# Patient Record
Sex: Male | Born: 1940 | Race: White | Hispanic: No | Marital: Married | Smoking: Never smoker
Health system: Southern US, Community
[De-identification: ages and names within clinical notes are randomized; demographics above are authoritative.]

## PROBLEM LIST (undated history)

## (undated) DIAGNOSIS — F0150 Vascular dementia without behavioral disturbance: Secondary | ICD-10-CM

## (undated) DIAGNOSIS — I10 Essential (primary) hypertension: Secondary | ICD-10-CM

## (undated) DIAGNOSIS — I639 Cerebral infarction, unspecified: Secondary | ICD-10-CM

## (undated) DIAGNOSIS — T82898A Other specified complication of vascular prosthetic devices, implants and grafts, initial encounter: Secondary | ICD-10-CM

## (undated) DIAGNOSIS — E7800 Pure hypercholesterolemia, unspecified: Secondary | ICD-10-CM

## (undated) HISTORY — DX: Vascular dementia, unspecified severity, without behavioral disturbance, psychotic disturbance, mood disturbance, and anxiety: F01.50

## (undated) HISTORY — DX: Cerebral infarction, unspecified: I63.9

## (undated) HISTORY — DX: Pure hypercholesterolemia, unspecified: E78.00

## (undated) HISTORY — DX: Other specified complication of vascular prosthetic devices, implants and grafts, initial encounter: T82.898A

## (undated) HISTORY — DX: Essential (primary) hypertension: I10

## (undated) HISTORY — PX: GALLBLADDER SURGERY: SHX652

## (undated) HISTORY — PX: CATARACT EXTRACTION: SUR2

---

## 2006-02-24 ENCOUNTER — Ambulatory Visit: Payer: Self-pay | Admitting: Ophthalmology

## 2006-03-13 ENCOUNTER — Ambulatory Visit: Payer: Self-pay | Admitting: Unknown Physician Specialty

## 2006-03-31 ENCOUNTER — Ambulatory Visit: Payer: Self-pay | Admitting: Ophthalmology

## 2009-05-30 ENCOUNTER — Ambulatory Visit: Payer: Self-pay | Admitting: Radiation Oncology

## 2009-06-20 ENCOUNTER — Ambulatory Visit: Payer: Self-pay | Admitting: Radiation Oncology

## 2009-06-30 ENCOUNTER — Ambulatory Visit: Payer: Self-pay | Admitting: Radiation Oncology

## 2009-07-31 ENCOUNTER — Ambulatory Visit: Payer: Self-pay | Admitting: Radiation Oncology

## 2009-08-21 ENCOUNTER — Ambulatory Visit: Payer: Self-pay | Admitting: Radiation Oncology

## 2009-08-24 ENCOUNTER — Ambulatory Visit: Payer: Self-pay | Admitting: Radiation Oncology

## 2009-08-24 ENCOUNTER — Inpatient Hospital Stay (HOSPITAL_COMMUNITY): Admission: EM | Admit: 2009-08-24 | Discharge: 2009-08-28 | Payer: Self-pay | Admitting: Emergency Medicine

## 2009-08-25 ENCOUNTER — Encounter (INDEPENDENT_AMBULATORY_CARE_PROVIDER_SITE_OTHER): Payer: Self-pay | Admitting: Neurology

## 2009-08-27 ENCOUNTER — Encounter (INDEPENDENT_AMBULATORY_CARE_PROVIDER_SITE_OTHER): Payer: Self-pay | Admitting: Neurology

## 2009-08-28 ENCOUNTER — Ambulatory Visit: Payer: Self-pay | Admitting: Radiation Oncology

## 2009-09-03 ENCOUNTER — Encounter: Payer: Self-pay | Admitting: Neurology

## 2009-09-28 ENCOUNTER — Encounter: Payer: Self-pay | Admitting: Neurology

## 2009-10-28 ENCOUNTER — Ambulatory Visit: Payer: Self-pay | Admitting: Radiation Oncology

## 2009-11-08 ENCOUNTER — Ambulatory Visit: Payer: Self-pay | Admitting: Radiation Oncology

## 2009-11-28 ENCOUNTER — Ambulatory Visit: Payer: Self-pay | Admitting: Radiation Oncology

## 2010-01-28 ENCOUNTER — Ambulatory Visit: Payer: Self-pay | Admitting: Radiation Oncology

## 2010-02-22 ENCOUNTER — Ambulatory Visit: Payer: Self-pay | Admitting: Radiation Oncology

## 2010-02-28 ENCOUNTER — Ambulatory Visit: Payer: Self-pay | Admitting: Radiation Oncology

## 2010-03-11 ENCOUNTER — Encounter: Admission: RE | Admit: 2010-03-11 | Discharge: 2010-03-11 | Payer: Self-pay | Admitting: Neurology

## 2010-03-20 ENCOUNTER — Encounter: Payer: Self-pay | Admitting: Neurology

## 2010-04-01 ENCOUNTER — Ambulatory Visit (HOSPITAL_COMMUNITY)
Admission: RE | Admit: 2010-04-01 | Discharge: 2010-04-01 | Payer: Self-pay | Source: Home / Self Care | Admitting: Interventional Radiology

## 2010-06-05 ENCOUNTER — Ambulatory Visit: Payer: Self-pay | Admitting: Radiation Oncology

## 2010-06-20 ENCOUNTER — Ambulatory Visit: Payer: Self-pay | Admitting: Radiation Oncology

## 2010-06-30 ENCOUNTER — Ambulatory Visit: Payer: Self-pay | Admitting: Radiation Oncology

## 2010-07-02 ENCOUNTER — Ambulatory Visit: Payer: Self-pay | Admitting: Urology

## 2010-07-03 ENCOUNTER — Ambulatory Visit: Payer: Self-pay | Admitting: Radiation Oncology

## 2010-07-31 ENCOUNTER — Ambulatory Visit: Payer: Self-pay | Admitting: Radiation Oncology

## 2010-08-02 ENCOUNTER — Ambulatory Visit: Payer: Self-pay | Admitting: Radiation Oncology

## 2010-08-29 ENCOUNTER — Ambulatory Visit: Payer: Self-pay | Admitting: Radiation Oncology

## 2010-09-12 LAB — DIFFERENTIAL
Basophils Absolute: 0.1 10*3/uL (ref 0.0–0.1)
Basophils Relative: 1 % (ref 0–1)
Eosinophils Absolute: 0.2 10*3/uL (ref 0.0–0.7)
Eosinophils Relative: 5 % (ref 0–5)
Lymphocytes Relative: 22 % (ref 12–46)
Lymphs Abs: 0.9 10*3/uL (ref 0.7–4.0)
Monocytes Absolute: 0.7 10*3/uL (ref 0.1–1.0)
Monocytes Relative: 18 % — ABNORMAL HIGH (ref 3–12)
Neutro Abs: 2 10*3/uL (ref 1.7–7.7)
Neutrophils Relative %: 53 % (ref 43–77)

## 2010-09-12 LAB — CBC
HCT: 40.3 % (ref 39.0–52.0)
Hemoglobin: 13.5 g/dL (ref 13.0–17.0)
MCH: 31.1 pg (ref 26.0–34.0)
MCHC: 33.5 g/dL (ref 30.0–36.0)
MCV: 92.9 fL (ref 78.0–100.0)
Platelets: 227 10*3/uL (ref 150–400)
RBC: 4.34 MIL/uL (ref 4.22–5.81)
RDW: 13.7 % (ref 11.5–15.5)
WBC: 3.8 10*3/uL — ABNORMAL LOW (ref 4.0–10.5)

## 2010-09-12 LAB — BASIC METABOLIC PANEL
BUN: 20 mg/dL (ref 6–23)
CO2: 30 mEq/L (ref 19–32)
Calcium: 9.3 mg/dL (ref 8.4–10.5)
Chloride: 103 mEq/L (ref 96–112)
Creatinine, Ser: 1.56 mg/dL — ABNORMAL HIGH (ref 0.4–1.5)
GFR calc Af Amer: 54 mL/min — ABNORMAL LOW (ref >60)
GFR calc non Af Amer: 45 mL/min — ABNORMAL LOW (ref >60)
Glucose, Bld: 100 mg/dL — ABNORMAL HIGH (ref 70–99)
Potassium: 4.4 mEq/L (ref 3.5–5.1)
Sodium: 138 mEq/L (ref 135–145)

## 2010-09-12 LAB — SURGICAL PCR SCREEN
MRSA, PCR: NEGATIVE
Staphylococcus aureus: NEGATIVE

## 2010-09-12 LAB — PROTIME-INR
INR: 0.92 (ref 0.00–1.49)
Prothrombin Time: 12.6 seconds (ref 11.6–15.2)

## 2010-09-12 LAB — APTT: aPTT: 25 seconds (ref 24–37)

## 2010-09-20 LAB — BLOOD GAS, ARTERIAL
O2 Saturation: 97.7 %
PEEP: 5 cmH2O
Patient temperature: 95.3
RATE: 16 resp/min
pH, Arterial: 7.388 (ref 7.350–7.450)

## 2010-09-20 LAB — POCT I-STAT, CHEM 8
Creatinine, Ser: 1.4 mg/dL (ref 0.4–1.5)
Glucose, Bld: 102 mg/dL — ABNORMAL HIGH (ref 70–99)
HCT: 47 % (ref 39.0–52.0)
Hemoglobin: 16 g/dL (ref 13.0–17.0)
Potassium: 4.3 mEq/L (ref 3.5–5.1)
Sodium: 140 mEq/L (ref 135–145)
TCO2: 26 mmol/L (ref 0–100)

## 2010-09-20 LAB — URINALYSIS, MICROSCOPIC ONLY
Bilirubin Urine: NEGATIVE
Ketones, ur: NEGATIVE mg/dL
Protein, ur: NEGATIVE mg/dL
Urobilinogen, UA: 0.2 mg/dL (ref 0.0–1.0)

## 2010-09-20 LAB — BASIC METABOLIC PANEL
CO2: 23 mEq/L (ref 19–32)
Calcium: 7.7 mg/dL — ABNORMAL LOW (ref 8.4–10.5)
Chloride: 110 mEq/L (ref 96–112)
GFR calc Af Amer: >60 mL/min (ref >60)
Potassium: 3.7 mEq/L (ref 3.5–5.1)
Sodium: 138 mEq/L (ref 135–145)

## 2010-09-20 LAB — APTT: aPTT: 29 seconds (ref 24–37)

## 2010-09-20 LAB — GLUCOSE, CAPILLARY
Glucose-Capillary: 100 mg/dL — ABNORMAL HIGH (ref 70–99)
Glucose-Capillary: 111 mg/dL — ABNORMAL HIGH (ref 70–99)
Glucose-Capillary: 114 mg/dL — ABNORMAL HIGH (ref 70–99)
Glucose-Capillary: 116 mg/dL — ABNORMAL HIGH (ref 70–99)

## 2010-09-20 LAB — LIPID PANEL
LDL Cholesterol: 77 mg/dL (ref 0–99)
Triglycerides: 293 mg/dL — ABNORMAL HIGH (ref <150)
VLDL: 59 mg/dL — ABNORMAL HIGH (ref 0–40)

## 2010-09-20 LAB — PROTIME-INR
INR: 0.99 (ref 0.00–1.49)
Prothrombin Time: 13 seconds (ref 11.6–15.2)

## 2010-09-20 LAB — DIFFERENTIAL
Eosinophils Absolute: 0.3 10*3/uL (ref 0.0–0.7)
Lymphs Abs: 1.7 10*3/uL (ref 0.7–4.0)
Monocytes Relative: 13 % — ABNORMAL HIGH (ref 3–12)
Neutro Abs: 2.9 10*3/uL (ref 1.7–7.7)
Neutrophils Relative %: 51 % (ref 43–77)

## 2010-09-20 LAB — COMPREHENSIVE METABOLIC PANEL
ALT: 17 U/L (ref 0–53)
BUN: 17 mg/dL (ref 6–23)
CO2: 26 mEq/L (ref 19–32)
Calcium: 9.3 mg/dL (ref 8.4–10.5)
Creatinine, Ser: 1.28 mg/dL (ref 0.4–1.5)
GFR calc non Af Amer: 56 mL/min — ABNORMAL LOW (ref >60)
Glucose, Bld: 111 mg/dL — ABNORMAL HIGH (ref 70–99)
Sodium: 140 mEq/L (ref 135–145)
Total Protein: 7 g/dL (ref 6.0–8.3)

## 2010-09-20 LAB — MRSA PCR SCREENING: MRSA by PCR: NEGATIVE

## 2010-09-20 LAB — CBC
HCT: 36.9 % — ABNORMAL LOW (ref 39.0–52.0)
Hemoglobin: 13 g/dL (ref 13.0–17.0)
MCHC: 35 g/dL (ref 30.0–36.0)
MCHC: 35.2 g/dL (ref 30.0–36.0)
MCV: 97 fL (ref 78.0–100.0)
Platelets: 214 10*3/uL (ref 150–400)
RBC: 3.79 MIL/uL — ABNORMAL LOW (ref 4.22–5.81)
RBC: 4.69 MIL/uL (ref 4.22–5.81)
RDW: 13 % (ref 11.5–15.5)

## 2010-09-20 LAB — HEMOGLOBIN A1C: Mean Plasma Glucose: 123 mg/dL

## 2010-09-20 LAB — CK TOTAL AND CKMB (NOT AT ARMC)
CK, MB: 3.6 ng/mL (ref 0.3–4.0)
Total CK: 180 U/L (ref 7–232)

## 2010-12-04 ENCOUNTER — Ambulatory Visit: Payer: Self-pay | Admitting: Radiation Oncology

## 2010-12-29 ENCOUNTER — Ambulatory Visit: Payer: Self-pay | Admitting: Radiation Oncology

## 2011-05-06 ENCOUNTER — Encounter: Payer: Self-pay | Admitting: Internal Medicine

## 2011-05-31 ENCOUNTER — Encounter: Payer: Self-pay | Admitting: Internal Medicine

## 2012-08-11 DIAGNOSIS — R339 Retention of urine, unspecified: Secondary | ICD-10-CM | POA: Insufficient documentation

## 2012-08-11 DIAGNOSIS — C61 Malignant neoplasm of prostate: Secondary | ICD-10-CM | POA: Insufficient documentation

## 2012-08-11 DIAGNOSIS — N183 Chronic kidney disease, stage 3 unspecified: Secondary | ICD-10-CM | POA: Insufficient documentation

## 2012-09-08 ENCOUNTER — Other Ambulatory Visit: Payer: Self-pay | Admitting: Neurology

## 2012-09-08 DIAGNOSIS — I63239 Cerebral infarction due to unspecified occlusion or stenosis of unspecified carotid arteries: Secondary | ICD-10-CM

## 2012-10-26 ENCOUNTER — Ambulatory Visit: Payer: Self-pay | Admitting: Unknown Physician Specialty

## 2012-11-16 ENCOUNTER — Ambulatory Visit (INDEPENDENT_AMBULATORY_CARE_PROVIDER_SITE_OTHER): Payer: Medicare Other

## 2012-11-16 ENCOUNTER — Ambulatory Visit (INDEPENDENT_AMBULATORY_CARE_PROVIDER_SITE_OTHER): Payer: Medicare Other | Admitting: *Deleted

## 2012-11-16 DIAGNOSIS — I639 Cerebral infarction, unspecified: Secondary | ICD-10-CM

## 2012-11-16 DIAGNOSIS — Z0289 Encounter for other administrative examinations: Secondary | ICD-10-CM

## 2012-11-16 DIAGNOSIS — I63239 Cerebral infarction due to unspecified occlusion or stenosis of unspecified carotid arteries: Secondary | ICD-10-CM

## 2012-11-16 NOTE — Progress Notes (Signed)
Participant in the office today for his 3rd. Annual visit for the IRIS Study. MMSE was completed without any problems.  Blood was drawn and shipped. Participant to continue with follow-up per protocol.

## 2013-01-04 ENCOUNTER — Telehealth: Payer: Self-pay | Admitting: *Deleted

## 2013-01-04 NOTE — Telephone Encounter (Signed)
I called and gave results to pt. (carotid no change, tcd with collateral flow from L to R).  He verbalized understanding.

## 2013-02-03 DIAGNOSIS — N320 Bladder-neck obstruction: Secondary | ICD-10-CM | POA: Insufficient documentation

## 2013-07-27 ENCOUNTER — Encounter: Payer: Self-pay | Admitting: Neurology

## 2013-07-27 ENCOUNTER — Ambulatory Visit (INDEPENDENT_AMBULATORY_CARE_PROVIDER_SITE_OTHER): Payer: Medicare Other | Admitting: Neurology

## 2013-07-27 ENCOUNTER — Encounter (INDEPENDENT_AMBULATORY_CARE_PROVIDER_SITE_OTHER): Payer: Self-pay

## 2013-07-27 VITALS — BP 100/59 | HR 67 | Ht 69.0 in | Wt 195.0 lb

## 2013-07-27 DIAGNOSIS — I63239 Cerebral infarction due to unspecified occlusion or stenosis of unspecified carotid arteries: Secondary | ICD-10-CM

## 2013-07-27 NOTE — Patient Instructions (Signed)
Continue Plavix for secondary stroke prevention for strict control of hypertension with blood pressure goal below 130/90. Check followup carotid ultrasound in transfer and Doppler studies in May 2015. Return for followup in one year. Continue participation in the [place] stroke study as scheduled.

## 2013-07-27 NOTE — Progress Notes (Signed)
[facility] [address] [place]. [place] [address] [phone]       OFFICE FOLLOW-UP NOTE  [name]. 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 Risk factors of hypertension and hyperlipidemia. Patient his participating in the IRIS stroke trial. Update 07/27/13 : He returns for followup after last visit on 07/13/12. He continues to do well without any recurrent stroke or TIA symptoms. His blood pressure has been running low and his cardiologist discontinued her report has an S. he was having orthostatic symptoms. He had lipid profile check [place] 03/25/13 which showed total cholesterol 149, triglycerides 120, LDL 87 mg percent. Transcranial Doppler study dated 11/16/12 showed retrograde flow from the right ophthalmic artery and limited mean flow velocities in left A1 and right C4 indicating of proximal right ICA occlusion with collateral flow. Carotid Dopplers showed and related right ECA velocity is consistent with moderate stenosis of the vessel. There were no significant changes compared with previous carotid Doppler study. He still continues to work part-time doing axis. He is still quite active and walks daily. He is still participating in the IRIS  study in scheduled to come for the study termination visit in May.  ROS:   14 system review of systems is positive for muscle cramps and coordination problems and all other systems negative  PMH:  Past Medical History  Diagnosis Date  . Hypertension   . High cholesterol   . Stroke     . Carotid stent occlusion     Social History:  History   Social History  . Marital Status: Married    Spouse Name: chris    Number of Children: 0  . Years of Education: college   Occupational History  . Not on file.   Social History Main Topics  . Smoking status: Never Smoker   . Smokeless tobacco: Not on file  . Alcohol Use: Yes  . Drug Use: No  . Sexual Activity: Not on file   Other Topics Concern  . Not on file   Social History Narrative  . No narrative on file    Medications:   No current outpatient prescriptions on file prior to visit.   No current facility-administered medications on file prior to visit.    Allergies:   Allergies  Allergen Reactions  . Sulphadimidine [Sulfamethazine]     Physical Exam General: well developed, well nourished Caucasian male, seated, in no evident distress Head: head normocephalic and atraumatic. Orohparynx benign Neck: supple with no carotid or supraclavicular bruits Cardiovascular: regular rate and rhythm, no murmurs Musculoskeletal: no deformity Skin:  no rash/petichiae Vascular:  Normal pulses all extremities Filed Vitals:   07/27/13 1443  BP: 100/59  Pulse: 67   Neurologic Exam Mental Status: Awake and fully alert. Oriented to place and time. Recent and remote memory intact. Attention span, concentration and fund of knowledge appropriate. Mood and affect appropriate.  Cranial Nerves: Fundoscopic exam not done . Pupils equal, briskly reactive to light. Extraocular movements full without nystagmus. Visual fields full to confrontation. Hearing intact. Facial sensation intact. Face, tongue, palate moves normally and symmetrically.  Motor: Normal  bulk and tone. Normal strength in all tested extremity muscles. Diminished fine finger movements on the left. Orbits right over left upper extremity. Sensory.: intact to touch and pinprick and vibratory sensation.  Coordination: Rapid alternating movements normal in all  extremities. Finger-to-nose and heel-to-shin performed accurately bilaterally. Gait and Station: Arises from chair without difficulty. Stance is normal. Gait demonstrates normal stride length and balance . Able to heel, toe and tandem walk without difficulty.  Reflexes: 1+ and symmetric. Toes downgoing.   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 Risk factors of hypertension and hyperlipidemia. Patient his participating in the IRIS stroke trial.     PLAN: Continue Plavix for secondary stroke prevention for strict control of hypertension with blood pressure goal below 130/90. Check followup carotid ultrasound in transfer and Doppler studies in May 2015. Return for followup in one year. Continue participation in the [place] stroke study as scheduled.     Note: This document was prepared with digital dictation and possible smart phrase technology. Any transcriptional errors that result from this process are unintentional

## 2013-11-16 ENCOUNTER — Ambulatory Visit (INDEPENDENT_AMBULATORY_CARE_PROVIDER_SITE_OTHER): Payer: Medicare Other

## 2013-11-16 ENCOUNTER — Ambulatory Visit (INDEPENDENT_AMBULATORY_CARE_PROVIDER_SITE_OTHER): Payer: Self-pay | Admitting: *Deleted

## 2013-11-16 DIAGNOSIS — Z0289 Encounter for other administrative examinations: Secondary | ICD-10-CM

## 2013-11-16 DIAGNOSIS — I639 Cerebral infarction, unspecified: Secondary | ICD-10-CM

## 2013-11-16 DIAGNOSIS — I63239 Cerebral infarction due to unspecified occlusion or stenosis of unspecified carotid arteries: Secondary | ICD-10-CM

## 2013-11-16 DIAGNOSIS — I6350 Cerebral infarction due to unspecified occlusion or stenosis of unspecified cerebral artery: Secondary | ICD-10-CM

## 2013-11-16 NOTE — Progress Notes (Signed)
Participant in the office today for his IRIS 4th. Annual and EXIT visit. MMSE was completed successfully without any problems.  Blood was drawn and shipped. Participant has been off study drug since June of 2013, because of Prostate Cancer.  Participant to continue with follow-up visits with his PCP.  Participant will be notified of study trial results in late 2015.

## 2013-12-06 ENCOUNTER — Telehealth: Payer: Self-pay | Admitting: *Deleted

## 2013-12-06 NOTE — Telephone Encounter (Signed)
Spoke with pt and relayed the carotid doppler results (known finding of R carotid artery and stent) and due to that the TCD doppler results showed increased flow in some vessels inside the brain in order to compensate for this blockage in R carotid.  (an expected finding - nothing to worry about). Pt verbalized understanding.

## 2013-12-29 ENCOUNTER — Encounter: Payer: Self-pay | Admitting: Family Medicine

## 2014-01-20 DIAGNOSIS — N400 Enlarged prostate without lower urinary tract symptoms: Secondary | ICD-10-CM | POA: Insufficient documentation

## 2014-01-20 DIAGNOSIS — I679 Cerebrovascular disease, unspecified: Secondary | ICD-10-CM | POA: Insufficient documentation

## 2014-01-20 DIAGNOSIS — M109 Gout, unspecified: Secondary | ICD-10-CM | POA: Insufficient documentation

## 2014-01-20 DIAGNOSIS — I639 Cerebral infarction, unspecified: Secondary | ICD-10-CM | POA: Insufficient documentation

## 2014-01-20 DIAGNOSIS — E785 Hyperlipidemia, unspecified: Secondary | ICD-10-CM | POA: Insufficient documentation

## 2014-01-28 ENCOUNTER — Encounter: Payer: Self-pay | Admitting: Family Medicine

## 2014-02-28 ENCOUNTER — Encounter: Payer: Self-pay | Admitting: Family Medicine

## 2014-03-14 ENCOUNTER — Inpatient Hospital Stay: Payer: Self-pay | Admitting: Surgery

## 2014-03-14 LAB — CBC
HCT: 38.7 % — AB (ref 40.0–52.0)
HGB: 12.8 g/dL — ABNORMAL LOW (ref 13.0–18.0)
MCH: 30.7 pg (ref 26.0–34.0)
MCHC: 33.2 g/dL (ref 32.0–36.0)
MCV: 92 fL (ref 80–100)
PLATELETS: 211 10*3/uL (ref 150–440)
RBC: 4.19 10*6/uL — ABNORMAL LOW (ref 4.40–5.90)
RDW: 13.3 % (ref 11.5–14.5)
WBC: 14.5 10*3/uL — AB (ref 3.8–10.6)

## 2014-03-14 LAB — COMPREHENSIVE METABOLIC PANEL
ALBUMIN: 3.2 g/dL — AB (ref 3.4–5.0)
ALK PHOS: 34 U/L — AB
ANION GAP: 9 (ref 7–16)
BUN: 26 mg/dL — ABNORMAL HIGH (ref 7–18)
Bilirubin,Total: 0.6 mg/dL (ref 0.2–1.0)
CALCIUM: 8.9 mg/dL (ref 8.5–10.1)
CHLORIDE: 103 mmol/L (ref 98–107)
CREATININE: 2.05 mg/dL — AB (ref 0.60–1.30)
Co2: 24 mmol/L (ref 21–32)
EGFR (African American): 37 — ABNORMAL LOW
EGFR (Non-African Amer.): 32 — ABNORMAL LOW
GLUCOSE: 99 mg/dL (ref 65–99)
Osmolality: 277 (ref 275–301)
Potassium: 4.1 mmol/L (ref 3.5–5.1)
SGOT(AST): 31 U/L (ref 15–37)
SGPT (ALT): 28 U/L
SODIUM: 136 mmol/L (ref 136–145)
Total Protein: 7.2 g/dL (ref 6.4–8.2)

## 2014-03-14 LAB — LIPASE, BLOOD: Lipase: 84 U/L (ref 73–393)

## 2014-03-15 LAB — CBC WITH DIFFERENTIAL/PLATELET
BASOS PCT: 0.1 %
Basophil #: 0 10*3/uL (ref 0.0–0.1)
EOS ABS: 0 10*3/uL (ref 0.0–0.7)
EOS PCT: 0.1 %
HCT: 33.1 % — ABNORMAL LOW (ref 40.0–52.0)
HGB: 11.2 g/dL — ABNORMAL LOW (ref 13.0–18.0)
LYMPHS PCT: 2.1 %
Lymphocyte #: 0.2 10*3/uL — ABNORMAL LOW (ref 1.0–3.6)
MCH: 31.3 pg (ref 26.0–34.0)
MCHC: 33.8 g/dL (ref 32.0–36.0)
MCV: 93 fL (ref 80–100)
Monocyte #: 1 x10 3/mm (ref 0.2–1.0)
Monocyte %: 10.4 %
Neutrophil #: 8.4 10*3/uL — ABNORMAL HIGH (ref 1.4–6.5)
Neutrophil %: 87.3 %
PLATELETS: 176 10*3/uL (ref 150–440)
RBC: 3.58 10*6/uL — ABNORMAL LOW (ref 4.40–5.90)
RDW: 13 % (ref 11.5–14.5)
WBC: 9.6 10*3/uL (ref 3.8–10.6)

## 2014-03-15 LAB — URINALYSIS, COMPLETE
BACTERIA: NONE SEEN
BILIRUBIN, UR: NEGATIVE
Glucose,UR: NEGATIVE mg/dL (ref 0–75)
Leukocyte Esterase: NEGATIVE
NITRITE: NEGATIVE
Ph: 5 (ref 4.5–8.0)
Protein: 30
RBC,UR: 1 /HPF (ref 0–5)
Specific Gravity: 1.026 (ref 1.003–1.030)
Squamous Epithelial: <1
WBC UR: 3 /HPF (ref 0–5)

## 2014-03-15 LAB — COMPREHENSIVE METABOLIC PANEL
Albumin: 2.6 g/dL — ABNORMAL LOW (ref 3.4–5.0)
Alkaline Phosphatase: 28 U/L — ABNORMAL LOW
Anion Gap: 9 (ref 7–16)
BILIRUBIN TOTAL: 0.6 mg/dL (ref 0.2–1.0)
BUN: 26 mg/dL — ABNORMAL HIGH (ref 7–18)
CHLORIDE: 105 mmol/L (ref 98–107)
Calcium, Total: 8 mg/dL — ABNORMAL LOW (ref 8.5–10.1)
Co2: 22 mmol/L (ref 21–32)
Creatinine: 1.75 mg/dL — ABNORMAL HIGH (ref 0.60–1.30)
EGFR (African American): 44 — ABNORMAL LOW
EGFR (Non-African Amer.): 38 — ABNORMAL LOW
GLUCOSE: 107 mg/dL — AB (ref 65–99)
Osmolality: 277 (ref 275–301)
POTASSIUM: 4.2 mmol/L (ref 3.5–5.1)
SGOT(AST): 25 U/L (ref 15–37)
SGPT (ALT): 23 U/L
SODIUM: 136 mmol/L (ref 136–145)
TOTAL PROTEIN: 6 g/dL — AB (ref 6.4–8.2)

## 2014-03-15 LAB — LIPASE, BLOOD: Lipase: 50 U/L — ABNORMAL LOW (ref 73–393)

## 2014-03-15 LAB — FIBRINOGEN: Fibrinogen: 740 mg/dL — ABNORMAL HIGH (ref 210–470)

## 2014-03-15 LAB — PROTIME-INR
INR: 1.3
PROTHROMBIN TIME: 16.3 s — AB (ref 11.5–14.7)

## 2014-03-15 LAB — AMYLASE: Amylase: 14 U/L — ABNORMAL LOW (ref 25–115)

## 2014-03-15 LAB — APTT: ACTIVATED PTT: 41.6 s — AB (ref 23.6–35.9)

## 2014-03-15 LAB — MAGNESIUM: MAGNESIUM: 1.8 mg/dL

## 2014-03-17 LAB — CBC WITH DIFFERENTIAL/PLATELET
BASOS PCT: 0.4 %
Basophil #: 0 10*3/uL (ref 0.0–0.1)
EOS ABS: 0.1 10*3/uL (ref 0.0–0.7)
Eosinophil %: 1.4 %
HCT: 30.3 % — ABNORMAL LOW (ref 40.0–52.0)
HGB: 10.2 g/dL — ABNORMAL LOW (ref 13.0–18.0)
LYMPHS PCT: 5 %
Lymphocyte #: 0.4 10*3/uL — ABNORMAL LOW (ref 1.0–3.6)
MCH: 31.2 pg (ref 26.0–34.0)
MCHC: 33.8 g/dL (ref 32.0–36.0)
MCV: 92 fL (ref 80–100)
MONO ABS: 1.2 x10 3/mm — AB (ref 0.2–1.0)
Monocyte %: 13.9 %
NEUTROS ABS: 6.7 10*3/uL — AB (ref 1.4–6.5)
NEUTROS PCT: 79.3 %
Platelet: 194 10*3/uL (ref 150–440)
RBC: 3.29 10*6/uL — AB (ref 4.40–5.90)
RDW: 13.5 % (ref 11.5–14.5)
WBC: 8.5 10*3/uL (ref 3.8–10.6)

## 2014-03-17 LAB — COMPREHENSIVE METABOLIC PANEL
ALK PHOS: 34 U/L — AB
AST: 37 U/L (ref 15–37)
Albumin: 2.1 g/dL — ABNORMAL LOW (ref 3.4–5.0)
Anion Gap: 10 (ref 7–16)
BILIRUBIN TOTAL: 0.3 mg/dL (ref 0.2–1.0)
BUN: 12 mg/dL (ref 7–18)
CALCIUM: 7.9 mg/dL — AB (ref 8.5–10.1)
CHLORIDE: 102 mmol/L (ref 98–107)
CREATININE: 1.19 mg/dL (ref 0.60–1.30)
Co2: 24 mmol/L (ref 21–32)
EGFR (Non-African Amer.): >60
Glucose: 90 mg/dL (ref 65–99)
Osmolality: 271 (ref 275–301)
Potassium: 3.2 mmol/L — ABNORMAL LOW (ref 3.5–5.1)
SGPT (ALT): 24 U/L
Sodium: 136 mmol/L (ref 136–145)
Total Protein: 5.4 g/dL — ABNORMAL LOW (ref 6.4–8.2)

## 2014-03-22 LAB — PATHOLOGY REPORT

## 2014-06-06 DIAGNOSIS — I10 Essential (primary) hypertension: Secondary | ICD-10-CM | POA: Insufficient documentation

## 2014-06-06 DIAGNOSIS — N401 Enlarged prostate with lower urinary tract symptoms: Secondary | ICD-10-CM | POA: Insufficient documentation

## 2014-06-06 DIAGNOSIS — E7800 Pure hypercholesterolemia, unspecified: Secondary | ICD-10-CM | POA: Insufficient documentation

## 2014-07-27 ENCOUNTER — Ambulatory Visit: Payer: Medicare Other | Admitting: Neurology

## 2014-08-31 ENCOUNTER — Encounter: Payer: Self-pay | Admitting: Neurology

## 2014-08-31 ENCOUNTER — Ambulatory Visit (INDEPENDENT_AMBULATORY_CARE_PROVIDER_SITE_OTHER): Payer: Medicare Other | Admitting: Neurology

## 2014-08-31 VITALS — BP 106/64 | HR 65 | Ht 69.0 in | Wt 185.0 lb

## 2014-08-31 DIAGNOSIS — I6521 Occlusion and stenosis of right carotid artery: Secondary | ICD-10-CM | POA: Diagnosis not present

## 2014-08-31 NOTE — Progress Notes (Signed)
[facility] [address] [place]. [place] [address] [phone]       OFFICE FOLLOW-UP NOTE  [name]. 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 Risk factors of hypertension and hyperlipidemia. Patient his participating in the IRIS stroke trial. Update 07/27/13 : He returns for followup after last visit on 07/13/12. He continues to do well without any recurrent stroke or TIA symptoms. His blood pressure has been running low and his cardiologist discontinued her report has an S. he was having orthostatic symptoms. He had lipid profile check [place] 03/25/13 which showed total cholesterol 149, triglycerides 120, LDL 87 mg percent. Transcranial Doppler study dated 11/16/12 showed retrograde flow from the right ophthalmic artery and limited mean flow velocities in left A1 and right C4 indicating of proximal right ICA occlusion with collateral flow. Carotid Dopplers showed and related right ECA velocity is consistent with moderate stenosis of the vessel. There were no significant changes compared with previous carotid Doppler study. He still continues to work part-time doing axis. He is still quite active and walks daily. He is still participating in the IRIS  study in scheduled to come for the study termination visit in May. Update 08/30/2014 ; he returns for follow-up after last visit a year ago. He continues to do well from stroke standpoint and has not had any further strokes now for more than 4 years. He recently received a letter from the [place]  study after the results were published and found out that he was on the active medicine but he had stopped it in June 2013 due to prostate cancer. He has noticed some new ringing sound in his ear but has not discussed this yet with his primary physician. He has had issues with anxiety and intermittent speech difficulties. He has tried Celexa which didn't work and is currently on Wellbutrin 150 mg which also does not seem to be helping. I advised him to see his primary physician to further adjust his medications. He has no stroke related complaints. His tolerating Plavix well without bleeding, bruising. He states his blood pressure is under good control and last lipid profile was fine. ROS:   14 system review of systems is positive for nervousness, anxiety, speech difficulty, ringing in the ears and all other systems negative  PMH:  Past Medical History  Diagnosis Date  . Hypertension   . High cholesterol   . Stroke   . Carotid stent occlusion     Social History:  History   Social History  . Marital Status: Married    Spouse Name: chris  . Number of Children: 0  . Years of Education: college   Occupational History  . Not on file.   Social History Main Topics  . Smoking status: Never Smoker   . Smokeless tobacco: Never Used  . Alcohol Use: 0.0 oz/week    0 Standard drinks or equivalent per week  . Drug Use: No  . Sexual Activity: Not on file   Other Topics Concern  . Not on file   Social History Narrative   Patient is married, no children.  Patient is-- handed.   Patient has college education.   Patient drinks -cups daily.    Medications:   Current Outpatient Prescriptions on File Prior to Visit  Medication Sig Dispense Refill  . allopurinol (ZYLOPRIM) 100 MG tablet Take 100 mg by mouth daily.     . Calcium Carb-Cholecalciferol ([employer] CALCIUM + D3) 600-500 MG-UNIT CAPS Take 1 tablet by mouth daily.     . citalopram (CELEXA) 20 MG tablet Take 20 mg by mouth daily.     .  clopidogrel (PLAVIX) 75 MG tablet Take 75 mg by mouth daily.     . colchicine 0.6 MG tablet Take 0.6 mg by mouth as needed.    . fenofibrate (TRICOR) 145 MG tablet Take 145 mg by mouth daily.     . folic acid (FOLVITE) 003 MCG tablet Take 400 mcg by mouth daily.    . rosuvastatin (CRESTOR) 5 MG tablet Take 5 mg by mouth every Monday, Wednesday, and Friday at 6 PM.     . tamsulosin (FLOMAX) 0.4 MG CAPS capsule Take 0.4 mg by mouth daily.     No current facility-administered medications on file prior to visit.    Allergies:   Allergies  Allergen Reactions  . Sulphadimidine [Sulfamethazine]     Physical Exam General: well developed, well nourished Caucasian male, seated, in no evident distress Head: head normocephalic and atraumatic. Orohparynx benign Neck: supple with no carotid or supraclavicular bruits Cardiovascular: regular rate and rhythm, no murmurs Musculoskeletal: no deformity Skin:  no rash/petichiae Vascular:  Normal pulses all extremities Filed Vitals:   08/31/14 1043  BP: 106/64  Pulse: 65   Neurologic Exam Mental Status: Awake and fully alert. Oriented to place and time. Recent and remote memory intact. Attention span, concentration and fund of knowledge appropriate. Mood and affect appropriate.  Cranial Nerves: Fundoscopic exam not done . Pupils equal, briskly reactive to light. Extraocular movements full without nystagmus. Visual fields full to confrontation. Hearing intact. Voice is slightly hypophonic. Facial sensation intact. Face, tongue, palate moves normally and symmetrically.  Motor: Normal bulk and tone. Normal strength in all tested extremity muscles. Diminished fine finger movements on the left. Orbits right over left upper extremity. Sensory.: intact to touch and pinprick and vibratory sensation.  Coordination: Rapid alternating movements normal in all extremities. Finger-to-nose and heel-to-shin performed accurately bilaterally. Gait and Station: Arises  from chair without difficulty. Stance is normal. Gait demonstrates normal stride length and balance . Able to heel, toe and tandem walk without difficulty.  Reflexes: 1+ and symmetric. Toes downgoing.      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 Risk factors of hypertension and hyperlipidemia. Patient completed participating in the IRIS stroke trial.     PLAN:   I had a long d/w patient about his remote stroke, risk for recurrent stroke/TIAs, personally independently reviewed imaging studies and stroke evaluation results and answered questions.Continue clopidogrel 75 mg orally every day  for secondary stroke prevention and maintain strict control of hypertension with blood pressure goal below 130/90, diabetes with hemoglobin A1c goal below 6.5% and lipids with LDL cholesterol goal below 100 mg/dL. I also discussed the results of the IRIS trial which were recently published  and thanked the patient for his participation. Check screening carotid ultrasound and transcranial Doppler studies. Followup in the future with me in 1 year or call earlier if necessary   Note: This document was prepared with digital dictation and possible smart phrase technology. Any transcriptional errors that result from this process are unintentional

## 2014-08-31 NOTE — Patient Instructions (Signed)
I had a long d/w patient about his remote stroke, risk for recurrent stroke/TIAs, personally independently reviewed imaging studies and stroke evaluation results and answered questions.Continue clopidogrel 75 mg orally every day  for secondary stroke prevention and maintain strict control of hypertension with blood pressure goal below 130/90, diabetes with hemoglobin A1c goal below 6.5% and lipids with LDL cholesterol goal below 100 mg/dL. I also discussed the results of the IRIS trial which were recently published and thanked the patient for his participation. Check screening carotid ultrasound and transcranial Doppler studies. Followup in the future with me in 1 year or call earlier if necessary

## 2014-10-21 NOTE — Discharge Summary (Signed)
PATIENT NAME:  Troy Pugh, Troy Pugh MR#:  [id number] DATE OF BIRTH:  10-22-1940  DATE OF ADMISSION:  03/14/2014 DATE OF DISCHARGE:  03/19/2014  DISCHARGE DIAGNOSES: 1. Acute gangrenous and ruptured cholecystitis.  2. Preoperative and postoperative ileus. 3. Hypertension.  4. History of cerebrovascular accident 5 years ago, on Plavix.  5. Anxiety.  6. Dyslipidemia.  7. Gout.  8. History of pandiverticulosis.   DISCHARGE MEDICATIONS:  1. Folic acid 0.4 p.o. q. daily. 2. Pindalol 10 mg p.o. q. daily.  3. Allopurinol 100 mg p.o. at bedtime. 4. Plavix 75 mg p.o. q. daily.  5. Calcium and vitamin D 1 tab p.o. q. daily. 6. Colchicine 0.6 p.o. q. daily p.r.n. gout.  7. Flomax 0.4 mg p.o. q. daily.  8. Protonix 40 mg p.o. at bedtime.  9. TriCor 145 p.o. at bedtime. 10. Citalopram 10 mg p.o. q. daily. 11. Fexofenadine 180 p.o. q. daily.  12. Lipitor 10 mg p.o. Monday, Wednesday, Friday.  13. Norco 1 tablet p.o. q. 4 hours p.r.n. pain.   INDICATION FOR ADMISSION: [name] is a pleasant 74 year old male who presented with acute onset right upper quadrant pain, leukocytosis and findings consistent with acute cholecystitis. He also was noted to have a distended abdomen and some nausea and was on Plavix.   HOSPITAL COURSE: [name] was admitted with an acute cholecystitis and an ileus. He was on Plavix. He was given 4 days for Plavix to become decreased so we would have functional platelets. In addition, due to his ileus I allowed resuscitation and antibiotics for that period of time of 2 operations, for cholecystitis occurred on September 19.  On September 20 he was taking p.o. with good p.o. pain control, was voiding and stooling without difficulty and had a JP drain, which was non-bilious. He was discharged then in satisfactory condition.   DISCHARGE INSTRUCTIONS: [name] is to call or return to the ED if he has increased pain, nausea, vomiting, redness, drainage from his incision or change in  biliary character. He is to follow up with me in approximately 1 week.    ____________________________ Glena Norfolk. Lakeshia Dohner, MD cal:hh D: 03/28/2014 19:58:00 ET T: 03/29/2014 01:31:35 ET JOB#: [id number]  cc: Harrell Gave A. Leatrice Parilla, MD, <Dictator>  Floyde Parkins MD ELECTRONICALLY SIGNED 03/31/2014 1:21

## 2014-10-21 NOTE — H&P (Signed)
History of Present Illness 63 yom with a 3 days h/o generalized abdominal pain, perhaps worse in RUQ, associated with two loose BMs in last 72 hours. No fever, chills, hematochezia, nausea, or vomiting. Last meal breakfast; hungry. He has had some SOB ofr the last 8 hours or so. Denies cough and GU Sx.   Past History Prostate CA, s/p surgery and brachyTx. 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  Dyslipidemia Gout Pandiverticulosis on colonoscopy 09/2012   Past Med/Surgical Hx:  CVA:   Prostate Cancer:   Hypertension:   Hypercholesterolemia:   Cataract Extraction:   ALLERGIES:  Sulfa drugs: Rash, Hives  HOME MEDICATIONS: Medication Instructions Status  Avodart 0.5 mg oral capsule 1 tab(s) orally once a day x 30 days  Active  Klor-Con M20 oral tablet, extended release 1 tab(s) PO once a day AM Active  pindolol 10 mg oral tablet 1 tab(s) PO once a day AM Active  hydrochlorothiazide 25 mg oral tablet 1 tab(s) PO once a day AM Active  folic acid 0.4 mg oral tablet 1 tab(s) PO once a day AM Active  Aspirin 325 mg. oral tablet 1 tab(s)  once a day (in the morning)  Active  clopidogrel 75 mg oral tablet 1 tab(s) orally once a day (in the morning)  Active  colchicine 0.6 mg oral tablet 1 tab(s) orally 3 times a day as needed   Active  TriCor 145 mg oral tablet 1 tab(s) orally once a day (in the morning)  Active  allopurinol 100 mg oral tablet 1 cap(s) orally once a day (at bedtime)  Active  Crestor 5 mg oral tablet 1 tab(s) orally once a day (in the morning) on Monday, Wednesday and Friday Active  Plavix 75 mg oral tablet 1 tab(s) orally once a day Active  Fish Oil 1000 mg oral capsule 1 cap(s) orally 2 times a day Active  Flomax 0.4 mg oral capsule 1 tab(s) orally 2 times a day Active   Family and Social History:  Family History Non-Contributory   Social History negative tobacco, positive ETOH, married, quit smoking 5 years ago, drinks 1-2 drinks  (anything) about 3 days/week   Place of Living Home   Review of Systems:  Fever/Chills No   Cough No   Sputum No   Abdominal Pain Yes   Diarrhea No   Constipation No   Nausea/Vomiting No   SOB/DOE No   Chest Pain No   Dysuria No   Tolerating PT Yes   Tolerating Diet Yes   Medications/Allergies Reviewed Medications/Allergies reviewed   Physical Exam:  GEN well developed, well nourished, no acute distress, anxious   HEENT pink conjunctivae, PERRL, hearing intact to voice, moist oral mucosa, Oropharynx clear   NECK supple  trachea midline   RESP normal resp effort  clear BS  no use of accessory muscles  just finished inhalation resp Tx   CARD regular rate  no murmur  No LE edema  no JVD  no Rub   ABD positive tenderness  a little tender in LUQ, no tenderness in epigastrium, moderate tenderness in RUQ   EXTR negative cyanosis/clubbing, negative edema   SKIN normal to palpation, skin turgor good   NEURO cranial nerves intact, positive tremor, follows commands, motor/sensory function intact, left leg tremulous   PSYCH alert, A+O to time, place, person   Lab Results:  Hepatic:  15-Sep-15 18:33   Bilirubin, Total 0.6  Alkaline Phosphatase  34 (46-116 NOTE:  New Reference Range 01/17/14)  SGPT (ALT) 28 (14-63 NOTE: New Reference Range 01/17/14)  SGOT (AST) 31  Total Protein, Serum 7.2  Albumin, Serum  3.2  Routine Chem:  15-Sep-15 18:33   Lipase 84 (Result(s) reported on 14 Mar 2014 at 07:05PM.)  Glucose, Serum 99  BUN  26  Creatinine (comp)  2.05  Sodium, Serum 136  Potassium, Serum 4.1  Chloride, Serum 103  CO2, Serum 24  Calcium (Total), Serum 8.9  Osmolality (calc) 277  eGFR (African American)  37  eGFR (Non-African American)  32 (eGFR values <22m/min/1.73 m2 may be an indication of chronic kidney disease (CKD). Calculated eGFR is useful in patients with stable renal function. The eGFR calculation will not be reliable in acutely ill  patients when serum creatinine is changing rapidly. It is not useful in  patients on dialysis. The eGFR calculation may not be applicable to patients at the low and high extremes of body sizes, pregnant women, and vegetarians.)  Anion Gap 9  Routine Hem:  15-Sep-15 18:33   WBC (CBC)  14.5  RBC (CBC)  4.19  Hemoglobin (CBC)  12.8  Hematocrit (CBC)  38.7  Platelet Count (CBC) 211 (Result(s) reported on 14 Mar 2014 at 07:02PM.)  MCV 92  MCH 30.7  MCHC 33.2  RDW 13.3   Radiology Results: LabUnknown:    15-Sep-15 21:34, CT Abdomen and Pelvis Without Contrast  PACS Image  CT:  CT Abdomen and Pelvis Without Contrast  REASON FOR EXAM:    (1) RLQ pain; (2) RLQ pain  COMMENTS:       PROCEDURE: CT  - CT ABDOMEN AND PELVIS W0  - Mar 14 2014  9:34PM     CLINICAL DATA:  Right lower quadrant pain    EXAM:  CT ABDOMEN AND PELVIS WITHOUT CONTRAST    TECHNIQUE:  Multidetector CT imaging of the abdomen and pelvis was performed  following the standard protocol without IV contrast.    COMPARISON:  None.  FINDINGS:  9 mm right lower lobe sub solid pulmonary nodule on image for.    A single gallstone is present. There is thickening of the wall of  the gallbladder with stranding in the adjacent fat. There is also  thickening of the wall of the adjacent colon. Colonic diverticulosis  at the splenic flexure is present. No abscess or extraluminal bowel  gas.    Liver, spleen, pancreas, adrenal glands are within normal limits. No  evidence of hydronephrosis.    There is a small amount of free fluid at the base of the appendix.  The appendix is within normal limits.  Brachy therapy pellets in the prostate gland are present.    Sigmoid diverticulosis without acute diverticulitis.    No vertebral compression deformity. Lumbar degenerative disc  disease.     IMPRESSION:  There is a gallstone associated with inflammation of the gallbladder  and surrounding fat. There is also inflammatory  change of the  adjacent: . These findings are favored to represent acute  cholecystitis with secondary inflammation of the colon. One must  also also consider focal diverticulitis with secondary inflammation  of the gallbladder.  9 mm right lower lobe sub solid nodule. Initial follow-up by chest  CT without contrast is recommended in 3 months to confirm  persistence. This recommendation follows the consensus statement:  Recommendations for the Management of Subsolid Pulmonary Nodules  Detected at CT: A Statement from the [employer] published  in Radiology 2013; 266:304-317.  Chronic changes.      Electronically Signed    By: Maryclare Bean M.D.    On: 03/14/2014 21:50       Verified By: Jamas Lav, M.D.,    Assessment/Admission Diagnosis I favor severe acute cholecystitis with secondary pericolonic inflammation over hepatic flexure diverticulitis, but that is possible.   Plan Admit, IVF, IV ABx Cl liq diet Possible lap CCY soon (hold Plavix, ASA, fish oil) Pt and wife (and friends) understand reasoning behind plan and know Dr Rexene Edison will take over in AM and they are in agreement and satisfied.   Electronic Signatures: Consuela Mimes (MD)  (Signed 15-Sep-15 23:04)  Authored: CHIEF COMPLAINT and HISTORY, PAST MEDICAL/SURGIAL HISTORY, ALLERGIES, HOME MEDICATIONS, FAMILY AND SOCIAL HISTORY, REVIEW OF SYSTEMS, PHYSICAL EXAM, LABS, Radiology, ASSESSMENT AND PLAN   Last Updated: 15-Sep-15 23:04 by Consuela Mimes (MD)

## 2014-10-21 NOTE — Consult Note (Signed)
PATIENT NAME:  Troy Pugh, Troy Pugh MR#:  [id number] DATE OF BIRTH:  06-18-1941  DATE OF CONSULTATION:  03/15/2014  REFERRING PHYSICIAN:  Harrell Gave A. Lundquist, MD.  PRIMARY CARE PHYSICIAN:  Derinda Late, MD.  CONSULTING PHYSICIAN:  Belia Heman. Verdell Carmine, MD  REASON FOR CONSULTATION: Medical management.   HISTORY OF PRESENT ILLNESS: This is a 74 year old male who presents to the hospital with a 3-4-day history of abdominal pain. The pain is described more on the right lower side of his abdomen associated with some nausea but no vomiting. He denies any diarrhea, denies any fevers, chills, or any recent sick contacts. The patient came to the Emergency Room, underwent a CT scan of the abdomen and pelvis which showed possible acute cholecystitis or even acute diverticulitis. The patient was admitted to the surgical service started on IV antibiotics and supportive care.  Hospitalist service was  contacted for further medical management. The patient denies any chest pain, any shortness of breath, admits to some mild nausea but no vomiting. Positive abdominal pain. No diarrhea. No hematochezia. No melena. No other associated symptoms presently.   REVIEW OF SYSTEMS:  CONSTITUTIONAL: No documented fever. No weight gain, no weight loss.  EYES: No blurry or double vision.  EARS, NOSE, THROAT: Denies postnasal drip. No redness of the oropharynx.  RESPIRATORY: No cough, no wheeze or hemoptysis no dyspnea.  CARDIOVASCULAR: No chest pain, no orthopnea, no palpitations, no syncope.  GASTROINTESTINAL: Positive nausea. No vomiting. Positive abdominal pain. No melena or hematochezia.  GENITOURINARY: No dysuria or hematuria.  ENDOCRINE: No polyuria or nocturia. No heat or cold intolerance.  HEMATOLOGIC: No anemia, no bruising, no bleeding.  INTEGUMENTARY: No rashes or lesions.  MUSCULOSKELETAL: No arthritis. No swelling. No gout.  NEUROLOGIC: No numbness or tingling. No ataxia. No seizure-type activity.   PSYCHIATRIC: No anxiety, no insomnia, no ADD.   PAST MEDICAL HISTORY: Consistent with a history of hypertension, history of previous CVA, hyperlipidemia, gout, GERD, BPH.   ALLERGIES: SULFA DRUGS.   SOCIAL HISTORY: Quit smoking 5 years ago. Does have a 20 pack-year smoking history. Occasional alcohol use. No illicit drug abuse. Lives with his wife.   FAMILY HISTORY: Mother and father are both deceased. Mother had normal pressure hydrocephalus. Father died from complications of old age.   CURRENT MEDICATIONS: Allopurinol 100 mg daily, atorvastatin 10 mg daily, calcium with vitamin D 1 tablet daily, Plavix 75 mg daily, colchicine 0.6 mg daily, fenofibrate 145 mg daily, Allegra 180 mg daily, Flonase 2 sprays to each nostril daily, folic acid 1 tablet daily, Protonix 40 mg daily, pindolol 10 mg daily, Flomax 0.4 mg daily.   PHYSICAL EXAMINATION: Presently: VITAL SIGNS: Temperature 98.7, pulse 92, respiration 22, blood pressure 118/70, oxygen saturation 95% on 1.5 liters nasal cannula.  GENERAL: He is a pleasant-appearing male in no apparent distress.  HEENT:  Atraumatic, normocephalic. Extraocular muscles are intact. Pupils react to light. Sclerae anicteric. No conjunctival injection. No pharyngeal erythema.  NECK: Supple. There is no jugular venous distention. No bruits, no lymphadenopathy, no thyromegaly.  HEART: Regular rate and rhythm. He does have a 2/6 systolic ejection murmur heard at the right sternal border. No rubs, no clicks.  LUNGS: Clear to auscultation bilaterally. Poor respiratory effort. No rales, no rhonchi, no wheezes.  ABDOMEN: Soft, distended. Hypoactive bowel sounds. No hepatosplenomegaly appreciated.  EXTREMITIES: No evidence of any cyanosis, clubbing, or peripheral edema. Has +2 pedal and radial pulses bilaterally.  NEUROLOGICAL: The patient is alert, awake and oriented x 3 with no focal  motor or sensory deficits appreciated bilaterally.  SKIN: Moist and warm with no rashes  appreciated.  LYMPHATIC: There is no cervical or axillary lymphadenopathy.   LABORATORY DATA: Serum glucose 107, BUN 26, creatinine 1.75, sodium 136, potassium 4.2, chloride 105, bicarbonate 22, albumin is 2.6, total bilirubin 0.6. White cell count 9.6, hemoglobin 11.2, hematocrit 33.1, platelet count 176,000. INR is 1.3.   IMAGING: The patient did undergo a CT scan of the abdomen and pelvis done without contrast showing there is a gallstone associated with inflammation of the gallbladder and surrounding fat. There are also inflammatory changes of the adjacent.  These findings are favored to represent acute cholecystitis with secondary inflammation of the colon. One must also consider focal diverticulitis.   ASSESSMENT AND PLAN: This is a 74 year old male with a history of hypertension, previous CVA, hyperlipidemia, gout, gastroesophageal reflux disease, benign prostatic hypertrophy, who presents to the hospital due to abdominal pain and CT scan findings suggestive of acute cholecystitis and diverticulitis.  1.  Acute cholecystitis/diverticulitis. Continue supportive care with IV antibiotics with clindamycin and ceftriaxone. Continue pain control, antiemetics and IV fluids for now. The patient is presently on a clear liquid diet. No contraindication to surgery at this time. The patient likely will go to the operating room in the next 1-2 days. Continue to hold Plavix for now. Continue perioperative beta blocker.  2.  Acute renal failure. This is likely prerenal in nature. Will continue to hydrate with IV fluids, follow BUN and creatinine and urine output.  3.  Hypertension. I will start the patient's pindolol, continue p.r.n. metoprolol.  4.  Gout. No acute attack. Hold colchicine given the renal failure. Continue allopurinol.  5.  Hyperlipidemia. Continue Crestor.  6.  History of previous cerebrovascular accident, continue to hold Plavix as the patient may need surgery.  7.  Benign prostatic  hypertrophy. Continue Avodart and Flomax.   Thank you so much for the consultation. We will follow along with you.   Time spent: 50 minutes    ____________________________ Belia Heman. Verdell Carmine, MD vjs:lt D: 03/15/2014 14:18:21 ET T: 03/15/2014 14:32:15 ET JOB#: [id number]  cc: Belia Heman. Verdell Carmine, MD, <Dictator> Henreitta Leber MD ELECTRONICALLY SIGNED 03/20/2014 9:54

## 2014-10-21 NOTE — Consult Note (Signed)
Brief Consult Note: Diagnosis: 1. Acute Cholecystitis/Diverticulitis 2. HTN 3. Hyperlipdiemia 4. BPH 5. hx of previos CVA 6. Gout.   Patient was seen by consultant.   Consult note dictated.   Orders entered.   Discussed with Attending MD.   Comments: 74 yo male w/ hx of HTN, hx of previous CVA, Hyperlipidemia, GOUt, GERD, BPH, came into hospital due to abdominal pain and CT findings suggestive of acute cholecystits/diverticulitis.   1. Acute cholecystitis/diverticulitis - cont. care as per surgery.  - cont. IV abx, pain-control, anti-emetics, clear liquid diet.  - likely to OR in next 1-2 days.  No contraindications to surgery at this time.   2. Acute Renal Failure - likely pre-renal in nature.  - cont. IV fluids and follow BUN/Cr.    3. HtN - will start Pindolol.   4. GOut - no acute attack  - hold colchicine, given renal failure.  Cont. allopurinol.   5. Hyperlipidemia - cont. Crestor.   6. hx of Previous CVA - hold Plavix for now as pt. may need surgery.  7. BPH - cont. Avodart, Flomax.   Thanks for the consult and will follow with you. Job # [id number].  Electronic Signatures: Henreitta Leber (MD)  (Signed 16-Sep-15 14:18)  Authored: Brief Consult Note   Last Updated: 16-Sep-15 14:18 by Henreitta Leber (MD)

## 2014-10-21 NOTE — Discharge Summary (Signed)
PATIENT NAME:  Troy Pugh, SKALSKI MR#:  [id number] DATE OF BIRTH:  [birth year]  DATE OF ADMISSION:  03/14/2014 DATE OF DISCHARGE:  03/19/2014  DISCHARGE DIAGNOSES: 1. Acute gangrenous and ruptured cholecystitis.  2. Preoperative and postoperative ileus. 3. Hypertension.  4. History of cerebrovascular accident 5 years ago, on Plavix.  5. Anxiety.  6. Dyslipidemia.  7. Gout.  8. History of pandiverticulosis.   DISCHARGE MEDICATIONS:  1. Folic acid 0.4 p.o. q. daily. 2. Pindalol 10 mg p.o. q. daily.  3. Allopurinol 100 mg p.o. at bedtime. 4. Plavix 75 mg p.o. q. daily.  5. Calcium and vitamin D 1 tab p.o. q. daily. 6. Colchicine 0.6 p.o. q. daily p.r.n. gout.  7. Flomax 0.4 mg p.o. q. daily.  8. Protonix 40 mg p.o. at bedtime.  9. TriCor 145 p.o. at bedtime. 10. Citalopram 10 mg p.o. q. daily. 11. Fexofenadine 180 p.o. q. daily.  12. Lipitor 10 mg p.o. Monday, Wednesday, Friday.  13. Norco 1 tablet p.o. q. 4 hours p.r.n. pain.   INDICATION FOR ADMISSION: Mr. Klingensmith is a pleasant 74 year old male who presented with acute onset right upper quadrant pain, leukocytosis and findings consistent with acute cholecystitis. He also was noted to have a distended abdomen and some nausea and was on Plavix.   HOSPITAL COURSE: Mr. Lipton was admitted with an acute cholecystitis and an ileus. He was on Plavix. He was given 4 days for Plavix to become decreased so we would have functional platelets. In addition, due to his ileus I allowed resuscitation and antibiotics for that period of time of 2 operations, for cholecystitis occurred on September 19.  On September 20 he was taking p.o. with good p.o. pain control, was voiding and stooling without difficulty and had a JP drain, which was non-bilious. He was discharged then in satisfactory condition.   DISCHARGE INSTRUCTIONS: Mr. Zorn is to call or return to the ED if he has increased pain, nausea, vomiting, redness, drainage from his incision or change in  biliary character. He is to follow up with me in approximately 1 week.    ____________________________ Glena Norfolk. Dolly Harbach, MD cal:hh D: 03/28/2014 19:58:36 ET T: 03/29/2014 01:31:35 ET JOB#: [id number]  cc: Harrell Gave A. Naoma Boxell, MD, <Dictator>

## 2014-10-21 NOTE — Op Note (Signed)
PATIENT NAME:  Troy Pugh, Troy Pugh MR#:  [id number] DATE OF BIRTH:  [birth year]  DATE OF PROCEDURE:  03/18/2014  PREOPERATIVE DIAGNOSIS: Acute cholecystitis.   POSTOPERATIVE DIAGNOSIS: Acute cholecystitis, perforated, with gangrene.   PROCEDURE PERFORMED: Laparoscopic cholecystectomy.   ESTIMATED BLOOD LOSS: 10 mL.  COMPLICATIONS:  None.  SPECIMENS: Gallbladder.   INDICATION FOR SURGERY:  The patient is a pleasant 74 year old who presented with right upper quadrant pain, gallbladder thickening, and leukocytosis. He was on Plavix, so the decision was made to wait 4 days until his surgery. He was brought to the operating room for cholecystectomy.   DETAILS OF PROCEDURE AS FOLLOWS: Informed consent was obtained.  The patient was brought to the operating room suite. He was induced. Endotracheal tube was placed. General anesthesia was administered. His abdomen was prepped and draped in standard surgical fashion. A timeout was then performed correctly identifying the patient name, operative site, and procedure to be performed. A supraumbilical incision was made and deepened down to the fascia. The fascia was incised. The peritoneum was entered. Two stay sutures were placed through the fasciotomy. 11 mm epigastric Hasson trocar was placed through the abdomen. The abdomen was insufflated. A camera was placed through the Hasson trocar.  An 11 mm epigastric and two 5 mm right subcostal trocars were placed at the midclavicular and anterior axillary lines. The gallbladder was then lifted over the dome of the liver. The cystic duct and cystic artery were dissected out. There were a large number of adhesions and a visible perforation with leakage of purulence from the gallbladder. The gallbladder was also necrotic. The cystic duct was stapled due to the fact that the gallbladder was necrotic upstream and wanted to ensure proper seal edge of the duct.  The cystic artery was dissected out and ligated with clips.  The  gallbladder was then taken off the gallbladder fossa and brought out through an Endo Catch bag. The fossa was then examined, made hemostatic, and Surgicel was placed as the patient was on Plavix prior to this to ensure hemostasis.  The abdomen was then irrigated with a large amount of normal saline. The trocars were then removed under direct visualization. The supraumbilical fascia was closed with figure-of-eight 0 Vicryl. All port sites were closed with 4-0 Monocryl deep dermal sutures. Prior to desufflation, a JP drain was brought out through the midaxillary trocar and hooked to bulb suction to ensure that there was no bile leak from what could be a necrotic cystic duct.   Steri-Strips, Telfa gauze, and Tegaderm were placed over all wounds. The patient was then awoken, extubated, and brought to the postanesthesia care unit. There were no immediate complications. Needle, sponge, and instrument counts were correct at the end of the procedure.    ____________________________ Troy Pugh. Troy Sustaita, MD cal:nr D: 03/19/2014 08:07:00 ET T: 03/19/2014 14:24:52 ET JOB#: [id number]  cc: Harrell Gave A. Ziya Coonrod, MD, <Dictator> Floyde Parkins MD ELECTRONICALLY SIGNED 03/29/2014 0:56

## 2014-11-16 ENCOUNTER — Ambulatory Visit (INDEPENDENT_AMBULATORY_CARE_PROVIDER_SITE_OTHER): Payer: Medicare Other

## 2014-11-16 DIAGNOSIS — Z0289 Encounter for other administrative examinations: Secondary | ICD-10-CM

## 2014-11-16 DIAGNOSIS — I6521 Occlusion and stenosis of right carotid artery: Secondary | ICD-10-CM

## 2015-01-04 DIAGNOSIS — R5383 Other fatigue: Secondary | ICD-10-CM | POA: Insufficient documentation

## 2015-02-23 ENCOUNTER — Encounter: Payer: Self-pay | Admitting: Speech Pathology

## 2015-02-23 ENCOUNTER — Ambulatory Visit: Payer: Medicare Other | Attending: Internal Medicine | Admitting: Speech Pathology

## 2015-02-23 DIAGNOSIS — R490 Dysphonia: Secondary | ICD-10-CM

## 2015-02-23 NOTE — Therapy (Signed)
[name] [facility] SERVICES [address] [place], [place], [address] Phone: [phone]   Fax:  [phone]  Speech Language Pathology Evaluation  Patient Details  Name: Troy Pugh. MRN: [id number] Date of Birth: 06-22-1941 Referring Provider:  Idelle Crouch, MD  Encounter Date: 02/23/2015      End of Session - 02/23/15 1604    Visit Number 1   Number of Visits 17   Date for SLP Re-Evaluation 04/25/15   SLP Start Time 7   SLP Stop Time  1500   SLP Time Calculation (min) 60 min   Activity Tolerance Patient tolerated treatment well      Past Medical History  Diagnosis Date  . Hypertension   . High cholesterol   . Stroke   . Carotid stent occlusion     Past Surgical History  Procedure Laterality Date  . Cataract extraction      There were no vitals filed for this visit.  Visit Diagnosis: Dysphonia - Plan: SLP plan of care cert/re-cert      Subjective Assessment - 02/23/15 1603    Subjective The patient and his wife report that he is difficult to understand and that this has been ongoing since the stroke in 2011.   Patient is accompained by: Family member   Currently in Pain? 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   Subjective   Patient/Family Stated Goal Speak clearly   Prior Functional Status   Cognitive/Linguistic Baseline Baseline deficits  Right hemisphere cognitive deficits since 2011   Cognition   Overall Cognitive Status History of cognitive impairments - at baseline   Awareness Impaired   Problem Solving Impaired   Problem Solving Impairment Other (comment)  Safety awareness   Oral Motor/Sensory Function   Overall Oral Motor/Sensory Function Appears within functional limits for tasks assessed   Motor Speech   Overall Motor Speech Impaired    Respiration Impaired   Level of Impairment Conversation   Phonation Breathy;Low vocal intensity   Resonance Hypernasality   Articulation Within functional limitis   Intelligibility Intelligibility reduced  Due to hypophonia and hypernasality   Phonation Impaired   Vocal Abuses Habitual Hyperphonia;Glottal Attack  Strained vocal quality                         SLP Education - 02/23/15 1604    Education provided Yes   Education Details Safer eating strategies   Person(s) Educated Patient;Spouse   Methods Explanation   Comprehension Verbalized understanding            SLP Long Term Goals - 02/23/15 1607    SLP LONG TERM GOAL #1   Title The patient will demonstrate independent understanding of vocal hygiene concepts and neck, shoulder, lingual stretching exercises.   Time 8   Period Weeks   Status New   SLP LONG TERM GOAL #2   Title The patient will be independent for abdominal breathing and breath support exercises.   Time 8   Period Weeks   Status New   SLP LONG TERM GOAL #3   Title The patient will maximize voice quality, oral resonance, and loudness using breath support for sustained vowel production, pitch glides, and hierarchal speech drill.  Time 8   Period Weeks   Status New   SLP LONG TERM GOAL #4   Title The patient will maximize voice quality, oral resonance, and loudness using breath support for paragraph length recitation with 80% accuracy.   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 He demonstrates hypernasality, strained/breathy vocal quality, reduced breath support and control for speech, and laryngeal tension.  Stimulability testing showed the patient is able to generate a stronger, better quality voice given cues and SLP model.  The patient has not been offered  speech therapy in the past.  He will benefit from voice therapy for education, to improve breath control/support for speech, reduce laryngeal tension, and learn techniques to decrease nasality and to increase loudness and pitch range without strain.   Despite the hypernasal speech, the patient demonstrates good palatal elevation on phonation during testing.  He may benefit from referral to ENT to assess velopharyngeal competence.  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  We will offer ongoing counseling and suggestions for specific behaviors identified by the patient and his wife.   Speech Therapy Frequency 2x / week   Duration Other (comment)  8 weeks   Potential to Achieve Goals Good   Potential Considerations Ability to learn/carryover information;Cooperation/participation level;Previous level of function;Severity of impairments;Family/community support;Other (comment)  Stimulability   SLP Home Exercise Plan To be determined   Consulted and Agree with Plan of Care Patient;Family member/caregiver   Family Member Consulted Spouse          G-Codes - 02/25/15 1609    Functional Assessment Tool Used Perceptual Speech Evaluation   Functional Limitations Voice   Voice Current Status [phone]) At least 40 percent but less than 60 percent impaired, limited or restricted   Voice Goal Status ([id number]) At least 1 percent but less than 20 percent impaired, limited or restricted      Problem List Patient Active Problem List   Diagnosis Date Noted  . Occlusion and stenosis of carotid artery with cerebral infarction 07/27/2013   [name], MS/CCC- SLP  [name] 25-Feb-2015, 4:24 PM  [place] MAIN [facility] SERVICES [address] [place], [place], [address] Phone: [phone]   Fax:  [phone]

## 2015-02-28 ENCOUNTER — Ambulatory Visit: Payer: Medicare Other | Admitting: Speech Pathology

## 2015-02-28 ENCOUNTER — Encounter: Payer: Self-pay | Admitting: Speech Pathology

## 2015-02-28 DIAGNOSIS — R490 Dysphonia: Secondary | ICD-10-CM | POA: Diagnosis not present

## 2015-02-28 NOTE — Therapy (Signed)
[place] MAIN [facility] SERVICES [address] [place], [place], [address] Phone: [phone]   Fax:  [phone]  Speech Language Pathology Treatment  Patient Details  Name: Troy Pugh. MRN: [id number] Date of Birth: December 19, 1940 Referring Provider:  Idelle Crouch, MD  Encounter Date: 02/28/2015      End of Session - 02/28/15 1639    Visit Number 2   Number of Visits 17   Date for SLP Re-Evaluation 04/25/15   SLP Start Time 1502   SLP Stop Time  3300   SLP Time Calculation (min) 55 min   Activity Tolerance Patient tolerated treatment well      Past Medical History  Diagnosis Date  . Hypertension   . High cholesterol   . Stroke   . Carotid stent occlusion     Past Surgical History  Procedure Laterality Date  . Cataract extraction      There were no vitals filed for this visit.  Visit Diagnosis: Dysphonia      Subjective Assessment - 02/28/15 1639    Subjective The patient and his wife report that he is difficult to understand and that this has been ongoing since the stroke in 2011.               ADULT SLP TREATMENT - 02/28/15 0001    General Information   Behavior/Cognition Alert;Cooperative;Pleasant mood   Treatment Provided   Treatment provided Cognitive-Linquistic   Pain Assessment   Pain Assessment No/denies pain   Cognitive-Linquistic Treatment   Treatment focused on Voice   Skilled Treatment The patient was provided with written and verbal teaching regarding neck and shoulder relaxation exercises to promote relaxed phonation.  Patient was provided with verbal and written instruction in exercises for Muscle Tension Dysphonia and supplemental vocal tract relaxation exercises.  The patient was provided with written and verbal teaching regarding breath support exercises.  Patient demonstrates accurate execution of exercises.  Patient instructed in relaxed phonation / oral resonance. He is able to sustain  loud "ah" with clear oral resonance.   Assessment / Recommendations / Plan   Plan Continue with current plan of care   Progression Toward Goals   Progression toward goals Progressing toward goals          SLP Education - 02/28/15 1637    Education provided Yes   Education Details Neck and lingual stretches, vocal loudness, oral resonance, breath support   Person(s) Educated Patient   Methods Explanation;Demonstration;Verbal cues;Handout   Comprehension Verbalized understanding;Returned demonstration;Need further instruction            SLP Long Term Goals - 02/23/15 1607    SLP LONG TERM GOAL #1   Title The patient will demonstrate independent understanding of vocal hygiene concepts and neck, shoulder, lingual stretching exercises.   Time 8   Period Weeks   Status New   SLP LONG TERM GOAL #2   Title The patient will be independent for abdominal breathing and breath support exercises.   Time 8   Period Weeks   Status New   SLP LONG TERM GOAL #3   Title The patient will maximize voice quality, oral resonance, and loudness using breath support for sustained vowel production, pitch glides, and hierarchal speech drill.   Time 8   Period Weeks   Status New   SLP LONG TERM GOAL #4   Title The patient will maximize voice quality, oral resonance, and loudness using breath support for paragraph length recitation  with 80% accuracy.   Time 8   Period Weeks   Status New          Plan - 02/28/15 1640    Clinical Impression Statement The patient is able to perform selected exercises and stretches.  He is able to generate a clear, non-nasal voice on loud sustained "ah".   Speech Therapy Frequency 2x / week   Duration Other (comment)   Treatment/Interventions Other (comment)  Voice therapy   Potential to Achieve Goals Good   Potential Considerations Ability to learn/carryover information;Cooperation/participation level;Previous level of function;Severity of  impairments;Family/community support;Other (comment)   SLP Home Exercise Plan Neck and lingual stretches, vocal loudness, oral resonance, breath support   Consulted and Agree with Plan of Care Patient        Problem List Patient Active Problem List   Diagnosis Date Noted  . Occlusion and stenosis of carotid artery with cerebral infarction 07/27/2013   [name], MS/CCC- SLP  [name] 02/28/2015, 4:41 PM  [place] MAIN [facility] SERVICES [address] [place], [place], [address] Phone: [phone]   Fax:  [phone]

## 2015-03-02 ENCOUNTER — Ambulatory Visit: Payer: Medicare Other | Attending: Internal Medicine | Admitting: Speech Pathology

## 2015-03-02 ENCOUNTER — Encounter: Payer: Self-pay | Admitting: Speech Pathology

## 2015-03-02 ENCOUNTER — Ambulatory Visit: Payer: Self-pay | Admitting: Speech Pathology

## 2015-03-02 ENCOUNTER — Ambulatory Visit: Payer: Medicare Other | Admitting: Speech Pathology

## 2015-03-02 DIAGNOSIS — R490 Dysphonia: Secondary | ICD-10-CM | POA: Diagnosis not present

## 2015-03-02 NOTE — Therapy (Signed)
[place] MAIN [facility] SERVICES [address] [place], [place], [address] Phone: [phone]   Fax:  [phone]  Speech Language Pathology Treatment  Patient Details  Name: Troy Pugh. MRN: [id number] Date of Birth: 07/01/1940 Referring Provider:  Idelle Crouch, MD  Encounter Date: 03/02/2015      End of Session - 03/02/15 1240    Visit Number 3   Number of Visits 17   Date for SLP Re-Evaluation 04/25/15   SLP Start Time 1100   SLP Stop Time  1155   SLP Time Calculation (min) 55 min   Activity Tolerance Patient tolerated treatment well      Past Medical History  Diagnosis Date  . Hypertension   . High cholesterol   . Stroke   . Carotid stent occlusion     Past Surgical History  Procedure Laterality Date  . Cataract extraction      There were no vitals filed for this visit.  Visit Diagnosis: Dysphonia      Subjective Assessment - 03/02/15 1239    Subjective The patient is performing selected exercises and stretches.  He is able to generate a clear, non-nasal voice on loud sustained "ah".  He demonstrates reduced perceived nasality using vocal loudness technique.     Currently in Pain? No/denies               ADULT SLP TREATMENT - 03/02/15 0001    General Information   Behavior/Cognition Alert;Cooperative;Pleasant mood   Treatment Provided   Treatment provided Cognitive-Linquistic   Pain Assessment   Pain Assessment No/denies pain   Cognitive-Linquistic Treatment   Treatment focused on Voice   Skilled Treatment The patient was provided with written and verbal teaching regarding neck and shoulder relaxation exercises to promote relaxed phonation.  Patient was provided with verbal and written instruction in exercises for Muscle Tension Dysphonia and supplemental vocal tract relaxation exercises.  The patient was provided with written and verbal teaching regarding breath support exercises.  Patient  demonstrates improved execution of exercises.  Patient instructed in relaxed phonation / oral resonance. He is able to sustain loud "ah" with clear oral resonance.  Patient is inconsistently able to use vocal loudness to reduced hypernasality.  He is able to read sentences aloud (cued "slow, loud, careful") with 100% intelligibility and reduced perceived nasality.   Assessment / Recommendations / Plan   Plan Continue with current plan of care   Progression Toward Goals   Progression toward goals Progressing toward goals          SLP Education - 03/02/15 1239    Education Details Neck and lingual stretches, vocal loudness, oral resonance, breath support   Person(s) Educated Patient   Methods Explanation;Demonstration;Verbal cues;Handout   Comprehension Verbalized understanding;Need further instruction;Verbal cues required            SLP Long Term Goals - 02/23/15 1607    SLP LONG TERM GOAL #1   Title The patient will demonstrate independent understanding of vocal hygiene concepts and neck, shoulder, lingual stretching exercises.   Time 8   Period Weeks   Status New   SLP LONG TERM GOAL #2   Title The patient will be independent for abdominal breathing and breath support exercises.   Time 8   Period Weeks   Status New   SLP LONG TERM GOAL #3   Title The patient will maximize voice quality, oral resonance, and loudness using breath support for sustained vowel production, pitch  glides, and hierarchal speech drill.   Time 8   Period Weeks   Status New   SLP LONG TERM GOAL #4   Title The patient will maximize voice quality, oral resonance, and loudness using breath support for paragraph length recitation with 80% accuracy.   Time 8   Period Weeks   Status New          Plan - 03/02/15 1240    Clinical Impression Statement The patient is performing selected exercises and stretches.  He is able to generate a clear, non-nasal voice on loud sustained "ah".  He demonstrates  reduced perceived nasality using vocal loudness technique.     Speech Therapy Frequency 2x / week   Duration Other (comment)   Treatment/Interventions Other (comment)  voice therapy   Potential to Achieve Goals Good   Potential Considerations Ability to learn/carryover information;Cooperation/participation level;Previous level of function;Severity of impairments;Family/community support;Other (comment)   SLP Home Exercise Plan Neck and lingual stretches, vocal loudness, oral resonance, breath support   Consulted and Agree with Plan of Care Patient        Problem List Patient Active Problem List   Diagnosis Date Noted  . Occlusion and stenosis of carotid artery with cerebral infarction 07/27/2013   [name], MS/CCC- SLP  [name] 03/02/2015, 12:41 PM  [place] MAIN [facility] SERVICES [address] [place], [place], [address] Phone: [phone]   Fax:  [phone]

## 2015-03-09 ENCOUNTER — Ambulatory Visit: Payer: Medicare Other | Admitting: Speech Pathology

## 2015-03-09 ENCOUNTER — Encounter: Payer: Self-pay | Admitting: Speech Pathology

## 2015-03-09 DIAGNOSIS — R490 Dysphonia: Secondary | ICD-10-CM | POA: Diagnosis not present

## 2015-03-09 NOTE — Therapy (Signed)
[place] MAIN [facility] SERVICES [address] [place], [place], [address] Phone: [phone]   Fax:  [phone]  Speech Language Pathology Treatment  Patient Details  Name: Troy Pugh. MRN: [id number] Date of Birth: February 14, 1941 Referring Provider:  Idelle Crouch, MD  Encounter Date: 03/09/2015      End of Session - 03/09/15 1320    Visit Number 4   Number of Visits 17   Date for SLP Re-Evaluation 04/25/15   SLP Start Time 1100   SLP Stop Time  0998   SLP Time Calculation (min) 57 min   Activity Tolerance Patient tolerated treatment well      Past Medical History  Diagnosis Date  . Hypertension   . High cholesterol   . Stroke   . Carotid stent occlusion     Past Surgical History  Procedure Laterality Date  . Cataract extraction      There were no vitals filed for this visit.  Visit Diagnosis: Dysphonia      Subjective Assessment - 03/09/15 1315    Subjective The patient is performing selected exercises and stretches.  He is able to generate a clear, non-nasal voice on loud sustained "ah".  When prompted to "make it nasal," he produces words with sustained vowels hypernasally, and following this as a contrast, he can reduce the nasality to produce the vowel with a clear, non-nasal voice. He demonstrates reduced perceived nasality using vocal loudness technique.                 ADULT SLP TREATMENT - 03/09/15 0001    General Information   Behavior/Cognition Alert;Cooperative;Pleasant mood   Cognitive-Linquistic Treatment   Treatment focused on Voice   Skilled Treatment The clinician followed up on neck and shoulder relaxation exercises to promote relaxed phonation and integration of "loud, slow, and careful" speech into functional activities in the home. Patient instructed in oral resonance and clear, loud, and careful speech. He is able to sustain loud "ah" with clear oral resonance, and when sustaining vowels  within words, can make the sound hypernasal and then decrease the hypernasality to contrast it.  Patient cannot produce clear oral resonance at the word level without using this technique. He is inconsistently able to use vocal loudness to reduced hypernasality.  He is able to read sentences aloud (cued "slow, loud, careful") with 100% intelligibility and reduced perceived nasality.   Assessment / Recommendations / Plan   Plan Continue with current plan of care   Progression Toward Goals   Progression toward goals Progressing toward goals          SLP Education - 03/09/15 1318    Education provided Yes   Education Details Patient was instructed on "loud, clear, and careful" speech and concentrating on this when encountering longer paragraphs or speaking for longer amounts of time.   Person(s) Educated Patient   Methods Explanation   Comprehension Verbalized understanding;Returned demonstration            SLP Long Term Goals - 02/23/15 1607    SLP LONG TERM GOAL #1   Title The patient will demonstrate independent understanding of vocal hygiene concepts and neck, shoulder, lingual stretching exercises.   Time 8   Period Weeks   Status New   SLP LONG TERM GOAL #2   Title The patient will be independent for abdominal breathing and breath support exercises.   Time 8   Period Weeks   Status New   SLP  LONG TERM GOAL #3   Title The patient will maximize voice quality, oral resonance, and loudness using breath support for sustained vowel production, pitch glides, and hierarchal speech drill.   Time 8   Period Weeks   Status New   SLP LONG TERM GOAL #4   Title The patient will maximize voice quality, oral resonance, and loudness using breath support for paragraph length recitation with 80% accuracy.   Time 8   Period Weeks   Status New          Plan - 03/09/15 1321    Clinical Impression Statement The patient read with 95% intelligibility and when prompted again to use loud,  clear, and careful speech, reread with 100% intelligibility. Conversational speech was approximately 98% intelligible.    Speech Therapy Frequency 2x / week   Duration Other (comment)   Treatment/Interventions Other (comment)   Potential to Achieve Goals Good   Potential Considerations Ability to learn/carryover information;Cooperation/participation level;Previous level of function;Severity of impairments;Family/community support;Other (comment)   SLP Home Exercise Plan Neck and lingual stretches, vocal loudness, oral resonance, breath support   Consulted and Agree with Plan of Care Patient        Problem List Patient Active Problem List   Diagnosis Date Noted  . Occlusion and stenosis of carotid artery with cerebral infarction 07/27/2013    [name] 03/09/2015, 1:26 PM  [place] MAIN [facility] SERVICES [address] [place], [place], [address] Phone: [phone]   Fax:  [phone]

## 2015-03-14 ENCOUNTER — Ambulatory Visit: Payer: Medicare Other | Admitting: Speech Pathology

## 2015-03-14 ENCOUNTER — Encounter: Payer: Self-pay | Admitting: Speech Pathology

## 2015-03-14 DIAGNOSIS — R490 Dysphonia: Secondary | ICD-10-CM

## 2015-03-14 NOTE — Therapy (Signed)
[place] MAIN [facility] SERVICES [address] [place], [place], [address] Phone: [phone]   Fax:  [phone]  Speech Language Pathology Treatment  Patient Details  Name: Troy Pugh. MRN: [id number] Date of Birth: 1940-11-28 Referring Provider:  Idelle Crouch, MD  Encounter Date: 03/14/2015      End of Session - 03/14/15 1507    Visit Number 5   Number of Visits 17   Date for SLP Re-Evaluation 04/25/15   SLP Start Time 0210   SLP Stop Time  0259   SLP Time Calculation (min) 49 min   Activity Tolerance Patient tolerated treatment well      Past Medical History  Diagnosis Date  . Hypertension   . High cholesterol   . Stroke   . Carotid stent occlusion     Past Surgical History  Procedure Laterality Date  . Cataract extraction      There were no vitals filed for this visit.  Visit Diagnosis: No diagnosis found.      Subjective Assessment - 03/14/15 1504    Subjective The patient is performing selected exercises and stretches.  He is able to generate a clear, non-nasal voice on loud sustained "ah".  When prompted to "make it nasal," he produces words with sustained vowels hypernasally, and following this as a contrast, he can reduce the nasality to produce the vowel "ah" with a clear, non-nasal voice. He demonstrates reduced perceived nasality using vocal loudness technique.     Currently in Pain? No/denies               ADULT SLP TREATMENT - 03/14/15 0001    General Information   Behavior/Cognition Alert;Cooperative;Pleasant mood   Treatment Provided   Treatment provided Cognitive-Linquistic   Pain Assessment   Pain Assessment No/denies pain   Cognitive-Linquistic Treatment   Treatment focused on Voice   Skilled Treatment The clinician followed up on neck and shoulder relaxation exercises to promote relaxed phonation and integration of "loud, slow, and careful" speech into functional activities in the  home. Patient instructed in oral resonance and clear, loud, and careful speech. He is able to sustain loud "ah" with clear oral resonance, and when sustaining the "ah" vowel within words, can make the sound hypernasal and then decrease the hypernasality to contrast it.  Patient cannot produce clear oral resonance at the word level without using this technique. He is inconsistently able to use vocal loudness to reduced hypernasality. He cannot perceive whether an utterance was hypernasal or not. He is able to read sentences aloud (cued "slow, loud, careful") with 100% intelligibility and reduced perceived nasality.   Assessment / Recommendations / Plan   Plan Continue with current plan of care   Progression Toward Goals   Progression toward goals Progressing toward goals          SLP Education - 03/14/15 1505    Education provided Yes   Education Details The patient was instructed on the substitution of a swallow or a sip of water instead of clearing the throat.   Person(s) Educated Patient   Methods Explanation;Demonstration   Comprehension Verbalized understanding            SLP Long Term Goals - 02/23/15 1607    SLP LONG TERM GOAL #1   Title The patient will demonstrate independent understanding of vocal hygiene concepts and neck, shoulder, lingual stretching exercises.   Time 8   Period Weeks   Status New   SLP  LONG TERM GOAL #2   Title The patient will be independent for abdominal breathing and breath support exercises.   Time 8   Period Weeks   Status New   SLP LONG TERM GOAL #3   Title The patient will maximize voice quality, oral resonance, and loudness using breath support for sustained vowel production, pitch glides, and hierarchal speech drill.   Time 8   Period Weeks   Status New   SLP LONG TERM GOAL #4   Title The patient will maximize voice quality, oral resonance, and loudness using breath support for paragraph length recitation with 80% accuracy.   Time 8    Period Weeks   Status New          Plan - 03/14/15 1507    Clinical Impression Statement The patient has greatest intelligibility in the context of reading, using the loudest and clearest voice in this context with reduced hypernasality. He can reduce hypernasality at the word level after utilizing negative practice inconsistently. The patient reports that he is satisfied with his progress so far and feels therapy is making a difference.   Speech Therapy Frequency 2x / week   Duration Other (comment)   Treatment/Interventions Other (comment)   Potential to Achieve Goals Good   Potential Considerations Ability to learn/carryover information;Cooperation/participation level;Previous level of function;Severity of impairments;Family/community support;Other (comment)   SLP Home Exercise Plan Neck and lingual stretches, vocal loudness, oral resonance, breath support   Consulted and Agree with Plan of Care Patient        Problem List Patient Active Problem List   Diagnosis Date Noted  . Occlusion and stenosis of carotid artery with cerebral infarction 07/27/2013    [name] 03/14/2015, 3:11 PM  [place] MAIN [facility] SERVICES [address] [place], [place], [address] Phone: [phone]   Fax:  [phone]

## 2015-03-16 ENCOUNTER — Ambulatory Visit: Payer: Medicare Other | Admitting: Speech Pathology

## 2015-03-16 ENCOUNTER — Ambulatory Visit: Payer: Self-pay | Admitting: Speech Pathology

## 2015-03-16 ENCOUNTER — Encounter: Payer: Self-pay | Admitting: Speech Pathology

## 2015-03-16 DIAGNOSIS — R490 Dysphonia: Secondary | ICD-10-CM

## 2015-03-16 NOTE — Therapy (Signed)
[place] MAIN [facility] SERVICES [address] [place], [place], [address] Phone: [phone]   Fax:  [phone]  Speech Language Pathology Treatment  Patient Details  Name: Troy Pugh. MRN: [id number] Date of Birth: 02/23/1941 Referring Provider:  Idelle Crouch, MD  Encounter Date: 03/16/2015      End of Session - 03/16/15 1014    Visit Number 6   Number of Visits 17   Date for SLP Re-Evaluation 04/25/15   SLP Start Time 0900   SLP Stop Time  0951   SLP Time Calculation (min) 51 min   Activity Tolerance Patient tolerated treatment well      Past Medical History  Diagnosis Date  . Hypertension   . High cholesterol   . Stroke   . Carotid stent occlusion     Past Surgical History  Procedure Laterality Date  . Cataract extraction      There were no vitals filed for this visit.  Visit Diagnosis: No diagnosis found.      Subjective Assessment - 03/16/15 1016    Subjective The patient is performing selected exercises and stretches.  He is able to generate a clear, non-nasal voice on loud sustained "ah".  When prompted to "make it nasal," he produces words with sustained vowels hypernasally, and following this as a contrast, he can reduce the nasality to produce the vowel "ah" with a clear, non-nasal voice. He demonstrates reduced perceived nasality using vocal loudness technique.     Currently in Pain? No/denies                 SLP Education - 03/16/15 1013    Education provided Yes   Education Details The patient was instructed on abdominal breathing in place of strain on the vocal folds to generate loudness.   Person(s) Educated Patient   Methods Explanation;Demonstration   Comprehension Verbalized understanding;Returned demonstration            SLP Long Term Goals - 02/23/15 1607    SLP LONG TERM GOAL #1   Title The patient will demonstrate independent understanding of vocal hygiene concepts and  neck, shoulder, lingual stretching exercises.   Time 8   Period Weeks   Status New   SLP LONG TERM GOAL #2   Title The patient will be independent for abdominal breathing and breath support exercises.   Time 8   Period Weeks   Status New   SLP LONG TERM GOAL #3   Title The patient will maximize voice quality, oral resonance, and loudness using breath support for sustained vowel production, pitch glides, and hierarchal speech drill.   Time 8   Period Weeks   Status New   SLP LONG TERM GOAL #4   Title The patient will maximize voice quality, oral resonance, and loudness using breath support for paragraph length recitation with 80% accuracy.   Time 8   Period Weeks   Status New          Plan - 03/16/15 1015    Clinical Impression Statement The patient has loud and clear speech in conversation and reading, with good intelligibility. The patient can inconsistently reduce hypernasality at the word level after utilizing negative practice to contrast the productions. The patient reports that he is satisfied with his progress so far and feels therapy is making a difference.   Speech Therapy Frequency 2x / week   Duration Other (comment)   Treatment/Interventions Other (comment)   Potential to Achieve  Goals Good   Potential Considerations Ability to learn/carryover information;Cooperation/participation level;Previous level of function;Severity of impairments;Family/community support;Other (comment)   SLP Home Exercise Plan Neck and lingual stretches, vocal loudness, oral resonance, breath support   Consulted and Agree with Plan of Care Patient        Problem List Patient Active Problem List   Diagnosis Date Noted  . Occlusion and stenosis of carotid artery with cerebral infarction 07/27/2013    [name] 03/16/2015, 10:19 AM  [place] MAIN [facility] SERVICES [address] [place], [place], [address] Phone: [phone]   Fax:   [phone]

## 2015-03-21 ENCOUNTER — Ambulatory Visit: Payer: Medicare Other | Admitting: Speech Pathology

## 2015-03-21 ENCOUNTER — Encounter: Payer: Self-pay | Admitting: Speech Pathology

## 2015-03-21 DIAGNOSIS — R490 Dysphonia: Secondary | ICD-10-CM | POA: Diagnosis not present

## 2015-03-21 NOTE — Therapy (Signed)
[place] MAIN [facility] SERVICES [address] [place], [place], [address] Phone: [phone]   Fax:  [phone]  Speech Language Pathology Treatment  Patient Details  Name: Troy Pugh. MRN: [id number] Date of Birth: 05/02/1941 Referring Provider:  Idelle Crouch, MD  Encounter Date: 03/21/2015      End of Session - 03/21/15 1557    Visit Number 7   Number of Visits 17   Date for SLP Re-Evaluation 04/25/15   SLP Start Time 0157   SLP Stop Time  0252   SLP Time Calculation (min) 55 min   Activity Tolerance Patient tolerated treatment well      Past Medical History  Diagnosis Date  . Hypertension   . High cholesterol   . Stroke   . Carotid stent occlusion     Past Surgical History  Procedure Laterality Date  . Cataract extraction      There were no vitals filed for this visit.  Visit Diagnosis: No diagnosis found.      Subjective Assessment - 03/21/15 1457    Subjective The patient is performing selected exercises and stretches.  He is able to generate a clear, non-nasal voice on loud sustained "ah".  When prompted to "make it nasal," he produces words with sustained vowels hypernasally, and following this as a contrast, he can reduce the nasality to produce the vowel "ah" with a clear, non-nasal voice. He demonstrates reduced perceived nasality using vocal loudness technique.     Patient is accompained by: Family member   Currently in Pain? No/denies               ADULT SLP TREATMENT - 03/21/15 0001    General Information   Behavior/Cognition Alert;Cooperative;Pleasant mood   Treatment Provided   Treatment provided Cognitive-Linquistic   Pain Assessment   Pain Assessment No/denies pain   Cognitive-Linquistic Treatment   Treatment focused on Voice   Skilled Treatment The clinician followed up on throat clearing elimination to promote improved vocal hygiene. The clinician targeted sustained vowels and  sustained vowels within words; today, these could not be shaped to be produced without strain, so the task was ended. The patient practiced "loud, slow, and careful" speech in reading contexts with 90% intelligibility and in a conversational context with 100% intelligibility. He cannot perceive whether an utterance was hypernasal or not, but when utilizing clear, loud, careful speech, demonstrates decreased perceived nasality.   Assessment / Recommendations / Plan   Plan Continue with current plan of care   Progression Toward Goals   Progression toward goals Progressing toward goals          SLP Education - 03/21/15 1555    Education provided Yes   Education Details The patient was educated on intelligibility improvement with familiar listeners.   Person(s) Educated Patient   Methods Explanation   Comprehension Verbalized understanding;Need further instruction            SLP Long Term Goals - 02/23/15 1607    SLP LONG TERM GOAL #1   Title The patient will demonstrate independent understanding of vocal hygiene concepts and neck, shoulder, lingual stretching exercises.   Time 8   Period Weeks   Status New   SLP LONG TERM GOAL #2   Title The patient will be independent for abdominal breathing and breath support exercises.   Time 8   Period Weeks   Status New   SLP LONG TERM GOAL #3   Title The patient  will maximize voice quality, oral resonance, and loudness using breath support for sustained vowel production, pitch glides, and hierarchal speech drill.   Time 8   Period Weeks   Status New   SLP LONG TERM GOAL #4   Title The patient will maximize voice quality, oral resonance, and loudness using breath support for paragraph length recitation with 80% accuracy.   Time 8   Period Weeks   Status New          Plan - 03/21/15 1558    Clinical Impression Statement The patient has loud and clear speech in conversation and reading, with good intelligibility. The patient can  inconsistently reduce hypernasality at the word level after utilizing negative practice to contrast the productions. The patient reports that he is satisfied with his progress so far and feels therapy is making a difference.   Speech Therapy Frequency 2x / week   Duration Other (comment)   Treatment/Interventions Other (comment)   Potential to Achieve Goals Good   Potential Considerations Ability to learn/carryover information;Cooperation/participation level;Previous level of function;Severity of impairments;Family/community support;Other (comment)   SLP Home Exercise Plan Neck and lingual stretches, vocal loudness, oral resonance, breath support, functional sentences   Consulted and Agree with Plan of Care Patient        Problem List Patient Active Problem List   Diagnosis Date Noted  . Occlusion and stenosis of carotid artery with cerebral infarction 07/27/2013    Troy Pugh 03/21/2015, 3:59 PM  [place] MAIN [facility] SERVICES [address] [place], [place], [address] Phone: [phone]   Fax:  [phone]

## 2015-03-23 ENCOUNTER — Encounter: Payer: Self-pay | Admitting: Speech Pathology

## 2015-03-23 ENCOUNTER — Ambulatory Visit: Payer: Medicare Other | Admitting: Speech Pathology

## 2015-03-23 ENCOUNTER — Ambulatory Visit: Payer: Self-pay | Admitting: Speech Pathology

## 2015-03-23 DIAGNOSIS — R490 Dysphonia: Secondary | ICD-10-CM | POA: Diagnosis not present

## 2015-03-23 NOTE — Therapy (Signed)
[place] MAIN [facility] SERVICES [address] [place], [place], [address] Phone: [phone]   Fax:  [phone]  Speech Language Pathology Treatment  Patient Details  Name: Troy Pugh. MRN: [id number] Date of Birth: [birth year] Referring Provider:  Idelle Crouch, MD  Encounter Date: 03/23/2015      End of Session - 03/23/15 1054    Visit Number 8   Date for SLP Re-Evaluation 04/25/15   SLP Start Time 1001   SLP Stop Time  1048   SLP Time Calculation (min) 47 min      Past Medical History  Diagnosis Date  . Hypertension   . High cholesterol   . Stroke   . Carotid stent occlusion     Past Surgical History  Procedure Laterality Date  . Cataract extraction      There were no vitals filed for this visit.  Visit Diagnosis: Dysphonia      Subjective Assessment - 03/23/15 1053    Subjective The patient reports that his wife tells him he sounds better and is easier to understand.   Currently in Pain? No/denies               ADULT SLP TREATMENT - 03/23/15 0001    General Information   Behavior/Cognition Alert;Cooperative;Pleasant mood   Treatment Provided   Treatment provided Cognitive-Linquistic   Pain Assessment   Pain Assessment No/denies pain   Cognitive-Linquistic Treatment   Treatment focused on Voice   Skilled Treatment The patient was provided with written and verbal teaching regarding neck and shoulder relaxation exercises to promote relaxed phonation.  Patient was provided with verbal and written instruction in exercises for Muscle Tension Dysphonia and supplemental vocal tract relaxation exercises.  The patient was provided with written and verbal teaching regarding breath support exercises.  Patient demonstrates improved execution of exercises.  Patient instructed in relaxed phonation / oral resonance. He is able to sustain loud "ah" with clear oral resonance.  Patient is consistently able to use vocal  loudness to reduce perceived hypernasality.  He is able to generate sentences with 90% intelligibility and reduced perceived nasality.   Assessment / Recommendations / Plan   Plan Continue with current plan of care   Progression Toward Goals   Progression toward goals Progressing toward goals          SLP Education - 03/23/15 1053    Education provided Yes   Education Details Neck and lingual stretches, vocal loudness, oral resonance, breath support   Person(s) Educated Patient   Methods Explanation   Comprehension Verbalized understanding;Returned demonstration;Need further instruction            SLP Long Term Goals - 02/23/15 1607    SLP LONG TERM GOAL #1   Title The patient will demonstrate independent understanding of vocal hygiene concepts and neck, shoulder, lingual stretching exercises.   Time 8   Period Weeks   Status New   SLP LONG TERM GOAL #2   Title The patient will be independent for abdominal breathing and breath support exercises.   Time 8   Period Weeks   Status New   SLP LONG TERM GOAL #3   Title The patient will maximize voice quality, oral resonance, and loudness using breath support for sustained vowel production, pitch glides, and hierarchal speech drill.   Time 8   Period Weeks   Status New   SLP LONG TERM GOAL #4   Title The patient will maximize voice quality,  oral resonance, and loudness using breath support for paragraph length recitation with 80% accuracy.   Time 8   Period Weeks   Status New          Plan - 03/23/15 1054    Clinical Impression Statement The patient demonstrates improved intelligibility and reduced perceived nasality using vocal loudness technique in structured and unstructured tasks.     Speech Therapy Frequency 2x / week   Duration Other (comment)   Treatment/Interventions Other (comment)  Voice therapy   Potential to Achieve Goals Good   Potential Considerations Ability to learn/carryover  information;Cooperation/participation level;Previous level of function;Severity of impairments;Family/community support;Other (comment)   SLP Home Exercise Plan Neck and lingual stretches, vocal loudness, oral resonance, breath support, functional sentences   Consulted and Agree with Plan of Care Patient        Problem List Patient Active Problem List   Diagnosis Date Noted  . Occlusion and stenosis of carotid artery with cerebral infarction 07/27/2013   [name], MS/CCC- SLP  [name] 03/23/2015, 10:56 AM  [place] MAIN [facility] SERVICES [address] [place], [place], [address] Phone: [phone]   Fax:  [phone]

## 2015-03-28 ENCOUNTER — Encounter: Payer: Self-pay | Admitting: Speech Pathology

## 2015-03-28 ENCOUNTER — Ambulatory Visit: Payer: Medicare Other | Admitting: Speech Pathology

## 2015-03-28 DIAGNOSIS — R490 Dysphonia: Secondary | ICD-10-CM | POA: Diagnosis not present

## 2015-03-28 NOTE — Therapy (Signed)
[place] MAIN [facility] SERVICES [address] [place], [place], [address] Phone: [phone]   Fax:  [phone]  Speech Language Pathology Treatment  Patient Details  Name: Troy Pugh. MRN: [id number] Date of Birth: 1940/12/16 Referring Provider:  Derinda Late, MD  Encounter Date: 03/28/2015      End of Session - 03/28/15 1601    Visit Number 9   Number of Visits 17   Date for SLP Re-Evaluation 04/25/15   SLP Start Time 1501   SLP Stop Time  5643   SLP Time Calculation (min) 49 min   Activity Tolerance Patient tolerated treatment well      Past Medical History  Diagnosis Date  . Hypertension   . High cholesterol   . Stroke   . Carotid stent occlusion     Past Surgical History  Procedure Laterality Date  . Cataract extraction      There were no vitals filed for this visit.  Visit Diagnosis: No diagnosis found.      Subjective Assessment - 03/28/15 1557    Subjective The patient reports that his wife and friends continue to tell him he sounds better and is easier to understand; he says he is very satisfied with his progress in speech therapy.   Currently in Pain? No/denies               ADULT SLP TREATMENT - 03/28/15 0001    General Information   Behavior/Cognition Alert;Cooperative;Pleasant mood   Treatment Provided   Treatment provided Cognitive-Linquistic   Pain Assessment   Pain Assessment No/denies pain   Cognitive-Linquistic Treatment   Treatment focused on Voice   Skilled Treatment The patient reports performing exercises successfully. Clinician demonstrated relaxed phonation / oral resonance. He is only inconsistently able to sustain loud "ah" with clear oral resonance.  Patient is consistently able to use vocal loudness to reduce perceived hypernasality in reading and conversation.  He is able to generate sentences with 90% intelligibility and reduced perceived nasality. He copid clinician  intonation at the sentence level with 95% accuracy.   Assessment / Recommendations / Plan   Plan Continue with current plan of care   Progression Toward Goals   Progression toward goals Progressing toward goals          SLP Education - 03/28/15 1600    Education provided Yes   Education Details Discontinue sustained ahs at home to prevent strain of the VFs, instructed on the differences between rising and falling intonation.   Person(s) Educated Patient   Methods Explanation;Demonstration   Comprehension Verbalized understanding;Returned demonstration            SLP Long Term Goals - 02/23/15 1607    SLP LONG TERM GOAL #1   Title The patient will demonstrate independent understanding of vocal hygiene concepts and neck, shoulder, lingual stretching exercises.   Time 8   Period Weeks   Status New   SLP LONG TERM GOAL #2   Title The patient will be independent for abdominal breathing and breath support exercises.   Time 8   Period Weeks   Status New   SLP LONG TERM GOAL #3   Title The patient will maximize voice quality, oral resonance, and loudness using breath support for sustained vowel production, pitch glides, and hierarchal speech drill.   Time 8   Period Weeks   Status New   SLP LONG TERM GOAL #4   Title The patient will maximize voice quality, oral  resonance, and loudness using breath support for paragraph length recitation with 80% accuracy.   Time 8   Period Weeks   Status New          Plan - 03/28/15 1602    Clinical Impression Statement The patient demonstrates improved intelligibility and reduced perceived nasality using vocal loudness technique in structured and unstructured tasks.     Speech Therapy Frequency 2x / week   Duration Other (comment)   Treatment/Interventions Other (comment)  Voice therapy   Potential to Achieve Goals Good   Potential Considerations Ability to learn/carryover information;Cooperation/participation level;Previous level of  function;Severity of impairments;Family/community support;Other (comment)   SLP Home Exercise Plan Neck and lingual stretches, vocal loudness, oral resonance, breath support, functional sentences   Consulted and Agree with Plan of Care Patient        Problem List Patient Active Problem List   Diagnosis Date Noted  . Occlusion and stenosis of carotid artery with cerebral infarction 07/27/2013    [name] 03/28/2015, 4:02 PM  [place] MAIN [facility] SERVICES [address] [place], [place], [address] Phone: [phone]   Fax:  [phone]

## 2015-03-30 ENCOUNTER — Ambulatory Visit: Payer: Self-pay | Admitting: Speech Pathology

## 2015-03-30 ENCOUNTER — Encounter: Payer: Self-pay | Admitting: Speech Pathology

## 2015-03-30 ENCOUNTER — Ambulatory Visit: Payer: Medicare Other | Admitting: Speech Pathology

## 2015-03-30 DIAGNOSIS — R490 Dysphonia: Secondary | ICD-10-CM | POA: Diagnosis not present

## 2015-03-30 NOTE — Therapy (Signed)
[place] MAIN [facility] SERVICES [address] [place], [place], [address] Phone: [phone]   Fax:  [phone]  Speech Language Pathology Treatment  Patient Details  Name: Troy Pugh. MRN: [id number] Date of Birth: February 22, 1941 Referring Provider:  Idelle Crouch, MD  Encounter Date: 03/30/2015      End of Session - 03/30/15 1503    Visit Number 9   Number of Visits 17   Date for SLP Re-Evaluation 04/25/15   SLP Start Time 2   SLP Stop Time  1457   SLP Time Calculation (min) 55 min   Activity Tolerance Patient tolerated treatment well      Past Medical History  Diagnosis Date  . Hypertension   . High cholesterol   . Stroke   . Carotid stent occlusion     Past Surgical History  Procedure Laterality Date  . Cataract extraction      There were no vitals filed for this visit.  Visit Diagnosis: No diagnosis found.      Subjective Assessment - 03/30/15 1500    Subjective The patient reports that he is very satisfied with his progress in speech therapy and would like to continue to increase confidence for at least two more sessions. Will reevaluate his perception of his progress next week.   Currently in Pain? No/denies               ADULT SLP TREATMENT - 03/30/15 0001    General Information   Behavior/Cognition Alert;Cooperative;Pleasant mood   Treatment Provided   Treatment provided Cognitive-Linquistic   Pain Assessment   Pain Assessment No/denies pain   Cognitive-Linquistic Treatment   Treatment focused on Voice   Skilled Treatment The patient reports performing exercises successfully. He is only inconsistently able to sustain loud "ah" with clear oral resonance.  Patient is consistently able to use vocal loudness to reduce perceived hypernasality in reading and conversation.  He is able to generate sentences with 90% intelligibility and reduced perceived nasality. He copied clinician intonation at the  sentence level with 95% accuracy.   Assessment / Recommendations / Plan   Plan Continue with current plan of care          SLP Education - 03/30/15 1501    Education provided Yes   Education Details Progress made so far in speech therapy   Person(s) Educated Patient   Methods Explanation   Comprehension Verbalized understanding            SLP Long Term Goals - 03/30/15 1505    SLP LONG TERM GOAL #1   Title The patient will demonstrate independent understanding of vocal hygiene concepts and neck, shoulder, lingual stretching exercises.   Time 8   Period Weeks   Status Achieved   SLP LONG TERM GOAL #2   Title The patient will be independent for abdominal breathing and breath support exercises.   Time 8   Period Weeks   Status Achieved   SLP LONG TERM GOAL #3   Title The patient will maximize voice quality, oral resonance, and loudness using breath support for sustained vowel production, pitch glides, and hierarchal speech drill.   Time 8   Period Weeks   Status Deferred   SLP LONG TERM GOAL #4   Title The patient will maximize voice quality, oral resonance, and loudness using breath support for paragraph length recitation with 80% accuracy.   Time 8   Period Weeks   Status Partially Met  Plan - 03/30/15 1504    Clinical Impression Statement The patient demonstrates improved intelligibility and reduced perceived nasality using vocal loudness technique in structured and unstructured tasks.  He is working hard on generalizing this technique to other contexts consistently.   Speech Therapy Frequency 2x / week   Duration Other (comment)   Treatment/Interventions Other (comment)  Voice therapy   Potential to Achieve Goals Good   Potential Considerations Ability to learn/carryover information;Cooperation/participation level;Previous level of function;Severity of impairments;Family/community support;Other (comment)   SLP Home Exercise Plan Neck and lingual  stretches, vocal loudness, oral resonance, breath support, functional sentences   Consulted and Agree with Plan of Care Patient        Problem List Patient Active Problem List   Diagnosis Date Noted  . Occlusion and stenosis of carotid artery with cerebral infarction 07/27/2013    Troy Pugh 03/30/2015, 3:07 PM  [place] MAIN [facility] SERVICES [address] [place], [place], [address] Phone: [phone]   Fax:  [phone]

## 2015-04-04 ENCOUNTER — Ambulatory Visit: Payer: Medicare Other | Attending: Internal Medicine | Admitting: Speech Pathology

## 2015-04-04 ENCOUNTER — Encounter: Payer: Self-pay | Admitting: Speech Pathology

## 2015-04-04 DIAGNOSIS — R490 Dysphonia: Secondary | ICD-10-CM | POA: Insufficient documentation

## 2015-04-04 NOTE — Therapy (Signed)
[place] MAIN [facility] SERVICES [address] [place], [place], [address] Phone: [phone]   Fax:  [phone]  Speech Language Pathology Treatment  Patient Details  Name: Troy Pugh. MRN: [id number] Date of Birth: 25-Apr-1941 Referring Provider:  Idelle Crouch, MD  Encounter Date: 04/04/2015      End of Session - 04/04/15 1551    Visit Number 11   Number of Visits 17   Date for SLP Re-Evaluation 04/25/15   SLP Start Time 1447   SLP Stop Time  1539   SLP Time Calculation (min) 52 min   Activity Tolerance Patient tolerated treatment well      Past Medical History  Diagnosis Date  . Hypertension   . High cholesterol   . Stroke ([place])   . Carotid stent occlusion [facility])     Past Surgical History  Procedure Laterality Date  . Cataract extraction      There were no vitals filed for this visit.  Visit Diagnosis: No diagnosis found.      Subjective Assessment - 04/04/15 1549    Subjective The patient reports that he is very satisfied with his progress in speech therapy and feels confident in functional contexts such as daily conversation and activities requiring the telephone. He would like for Friday to be his last session due to the amount of progress he has made and because his desired goals have been achieved.   Patient is accompained by: Family member   Currently in Pain? No/denies               ADULT SLP TREATMENT - 04/04/15 0001    General Information   Behavior/Cognition Alert;Cooperative;Pleasant mood   Treatment Provided   Treatment provided Cognitive-Linquistic   Pain Assessment   Pain Assessment No/denies pain   Cognitive-Linquistic Treatment   Treatment focused on Voice   Skilled Treatment The patient reports performing exercises successfully. He is only inconsistently able to sustain loud "ah" with clear oral resonance.  Patient is consistently able to use vocal loudness to reduce perceived  hypernasality in reading and conversation.  He is able to generate sentences with 90% intelligibility and reduced perceived nasality. He copied clinician intonation at the sentence level with 95% accuracy.   Assessment / Recommendations / Plan   Plan Continue with current plan of care   Progression Toward Goals   Progression toward goals Progressing toward goals          SLP Education - 04/04/15 1550    Education provided Yes   Education Details Education on continuing to practice loudness and exercises after therapy has ended to ensure carryover of learned skills continues.   Person(s) Educated Patient   Methods Explanation   Comprehension Verbalized understanding            SLP Long Term Goals - 03/30/15 1505    SLP LONG TERM GOAL #1   Title The patient will demonstrate independent understanding of vocal hygiene concepts and neck, shoulder, lingual stretching exercises.   Time 8   Period Weeks   Status Achieved   SLP LONG TERM GOAL #2   Title The patient will be independent for abdominal breathing and breath support exercises.   Time 8   Period Weeks   Status Achieved   SLP LONG TERM GOAL #3   Title The patient will maximize voice quality, oral resonance, and loudness using breath support for sustained vowel production, pitch glides, and hierarchal speech drill.   Time  8   Period Weeks   Status Deferred   SLP LONG TERM GOAL #4   Title The patient will maximize voice quality, oral resonance, and loudness using breath support for paragraph length recitation with 80% accuracy.   Time 8   Period Weeks   Status Partially Met          Plan - 04/04/15 1552    Clinical Impression Statement The patient demonstrates improved intelligibility and reduced perceived nasality consistently in conversations with clinicians, using a "slow, loud, and clear" voice.   Speech Therapy Frequency 2x / week   Duration Other (comment)   Treatment/Interventions Other (comment)  Voice  therapy   Potential to Achieve Goals Good   Potential Considerations Ability to learn/carryover information;Cooperation/participation level;Previous level of function;Severity of impairments;Family/community support;Other (comment)   SLP Home Exercise Plan Neck and lingual stretches, vocal loudness, oral resonance, breath support, functional sentences   Consulted and Agree with Plan of Care Patient        Problem List Patient Active Problem List   Diagnosis Date Noted  . Occlusion and stenosis of carotid artery with cerebral infarction 07/27/2013    [name] 04/04/2015, 3:54 PM  [place] MAIN [facility] SERVICES [address] [place], [place], [address] Phone: [phone]   Fax:  [phone]

## 2015-04-06 ENCOUNTER — Ambulatory Visit: Payer: Self-pay | Admitting: Speech Pathology

## 2015-04-06 ENCOUNTER — Encounter: Payer: Self-pay | Admitting: Speech Pathology

## 2015-04-06 ENCOUNTER — Ambulatory Visit: Payer: Medicare Other | Admitting: Speech Pathology

## 2015-04-06 DIAGNOSIS — R490 Dysphonia: Secondary | ICD-10-CM | POA: Diagnosis not present

## 2015-04-06 NOTE — Therapy (Signed)
[place] MAIN [facility] SERVICES [address] [place], [place], [address] Phone: [phone]   Fax:  [phone]  Speech Language Pathology Treatment/Discharge Summary  Patient Details  Name: Troy Pugh. MRN: [id number] Date of Birth: 1941/03/08 Referring Provider:  Idelle Crouch, MD  Encounter Date: 04/06/2015      End of Session - 04/06/15 1058    Visit Number 12   Number of Visits 17   Date for SLP Re-Evaluation 04/25/15   SLP Start Time 1010   SLP Stop Time  1055   SLP Time Calculation (min) 45 min   Activity Tolerance Patient tolerated treatment well      Past Medical History  Diagnosis Date  . Hypertension   . High cholesterol   . Stroke ([place])   . Carotid stent occlusion [facility])     Past Surgical History  Procedure Laterality Date  . Cataract extraction      There were no vitals filed for this visit.  Visit Diagnosis: No diagnosis found.      Subjective Assessment - 04/06/15 1055    Subjective The patient is pleased with his functional outcomes in therapy, making today his final session. He feels that speech therapy has improved his intelligibility across contexts.   Currently in Pain? No/denies               ADULT SLP TREATMENT - 04/06/15 0001    General Information   Behavior/Cognition Alert;Cooperative;Pleasant mood   Treatment Provided   Treatment provided Cognitive-Linquistic   Pain Assessment   Pain Assessment No/denies pain   Cognitive-Linquistic Treatment   Treatment focused on Voice   Skilled Treatment The patient reports performing exercises successfully. He is only inconsistently able to sustain loud "ah" with clear oral resonance.  Patient is consistently able to use vocal loudness to reduce perceived hypernasality in reading and conversation.  He is able to generate sentences with 100% intelligibility and reduced perceived nasality.    Assessment / Recommendations / Plan   Plan  Continue with current plan of care   Progression Toward Goals   Progression toward goals Goals met, education completed, patient discharged from SLP          SLP Education - 04/06/15 1056    Education provided Yes   Education Details Educated on being mindful of using slow, loud, and clear speech when misunderstood in the future to maximize intelligibility and reduce personal frustration   Person(s) Educated Patient   Methods Explanation   Comprehension Verbalized understanding            SLP Long Term Goals - 04/06/15 1103    SLP LONG TERM GOAL #1   Title The patient will demonstrate independent understanding of vocal hygiene concepts and neck, shoulder, lingual stretching exercises.   Time 8   Period Weeks   Status Achieved   SLP LONG TERM GOAL #2   Title The patient will be independent for abdominal breathing and breath support exercises.   Time 8   Period Weeks   Status Achieved   SLP LONG TERM GOAL #3   Title The patient will maximize voice quality, oral resonance, and loudness using breath support for sustained vowel production, pitch glides, and hierarchal speech drill.   Time 8   Period Weeks   Status Deferred   SLP LONG TERM GOAL #4   Title The patient will maximize voice quality, oral resonance, and loudness using breath support for paragraph length recitation with 80%  accuracy.   Time 8   Period Weeks   Status Achieved          Plan - 04/06/15 1059    Clinical Impression Statement The patient utilizes the "slow, loud, and clear" strategy developed by the clinicialnto achieve improved intelligibility and oral resonance as well as reduced perceived nasality. He has achieved his purpose for coming to speech therapy--to be more easily understood by those he interacts with in conversation. He shows greater insight into this deficit than clinicians predicted at his evaluation and he easily identifies strategies to prevent frustration on his part when he is not  understood.  The patient has maximized the benefits of speech therapy and is ready for discharge.   Speech Therapy Frequency 2x / week   Duration Other (comment)   Treatment/Interventions Other (comment)  Voice therapy   Potential to Achieve Goals Good   Potential Considerations Ability to learn/carryover information;Cooperation/participation level;Previous level of function;Severity of impairments;Family/community support;Other (comment)   SLP Home Exercise Plan Neck and lingual stretches, vocal loudness, oral resonance, breath support, functional sentences   Consulted and Agree with Plan of Care Patient        Problem List Patient Active Problem List   Diagnosis Date Noted  . Occlusion and stenosis of carotid artery with cerebral infarction 07/27/2013    [name] 04/06/2015, 3:41 PM  [place] MAIN [facility] SERVICES [address] [place], [place], [address] Phone: [phone]   Fax:  [phone]

## 2015-08-31 ENCOUNTER — Encounter: Payer: Self-pay | Admitting: Neurology

## 2015-08-31 ENCOUNTER — Ambulatory Visit (INDEPENDENT_AMBULATORY_CARE_PROVIDER_SITE_OTHER): Payer: Medicare Other | Admitting: Neurology

## 2015-08-31 VITALS — BP 115/67 | HR 71 | Ht 69.0 in | Wt 192.0 lb

## 2015-08-31 DIAGNOSIS — I63231 Cerebral infarction due to unspecified occlusion or stenosis of right carotid arteries: Secondary | ICD-10-CM | POA: Diagnosis not present

## 2015-08-31 NOTE — Patient Instructions (Signed)
I had a long d/w patient and his wife about his remote stroke, risk for recurrent stroke/TIAs, personally independently reviewed imaging studies and stroke evaluation results and answered questions.Continue Plavix  for secondary stroke prevention and maintain strict control of hypertension with blood pressure goal below 130/90, diabetes with hemoglobin A1c goal below 6.5% and lipids with LDL cholesterol goal below 70 mg/dL. I also advised the patient to eat a healthy diet with plenty of whole grains, cereals, fruits and vegetables, exercise regularly and maintain ideal body weight. Check follow-up screening carotid ultrasound and transcranial Doppler studies. Followup in the future with me in one year or call earlier if necessary.

## 2015-08-31 NOTE — Progress Notes (Signed)
[facility] [address] [place]. [place] [address] [phone]       OFFICE FOLLOW-UP NOTE  [name]. Date of Birth:  01-May-1941 Medical Record Number:  [id number]   HPI: 75 year old Caucasian male with remote history of right hemispheric infarct in March 2011 terminal right ICA occlusion and proximal right ICA stenosis treated with IV plus IA TPA and mechanical thrombectomy with Merci device with full recanalization and also needing rescue right ICA angioplasty/stenting for proximal right ICA stenosis which appeared occluded post procedure fortunately without clinical worsening. Risk factors of hypertension and hyperlipidemia. Patient his participating in the IRIS stroke trial. Update 07/27/13 : He returns for followup after last visit on 07/13/12. He continues to do well without any recurrent stroke or TIA symptoms. His blood pressure has been running low and his cardiologist discontinued her report has an S. he was having orthostatic symptoms. He had lipid profile check [place] 03/25/13 which showed total cholesterol 149, triglycerides 120, LDL 87 mg percent. Transcranial Doppler study dated 11/16/12 showed retrograde flow from the right ophthalmic artery and limited mean flow velocities in left A1 and right C4 indicating of proximal right ICA occlusion with collateral flow. Carotid Dopplers showed and related right ECA velocity is consistent with moderate stenosis of the vessel. There were no significant changes compared with previous carotid Doppler study. He still continues to work part-time doing axis. He is still quite active and walks daily. He is still participating in the IRIS  study in scheduled to come for the study termination visit in May. Update 08/30/2014 ; he returns for follow-up after last visit a year ago. He continues to do well from stroke standpoint and has not had any further strokes now for more than 4 years. He recently received a letter from the [place]  study after the results were published and found out that he was on the active medicine but he had stopped it in June 2013 due to prostate cancer. He has noticed some new ringing sound in his ear but has not discussed this yet with his primary physician. He has had issues with anxiety and intermittent speech difficulties. He has tried Celexa which didn't work and is currently on Wellbutrin 150 mg which also does not seem to be helping. I advised him to see his primary physician to further adjust his medications. He has no stroke related complaints. His tolerating Plavix well without bleeding, bruising. He states his blood pressure is under good control and last lipid profile was fine. 08/31/2015 : He returns for follow-up after last visit a year ago. He continues to do well without recurrent stroke or TIA symptoms. He continues to have minimum diminished fine motor skills and some stiffness and heaviness in his left leg particularly when he is tired or tries to walk quickly. His at no falls. He states blood pressure control was good and today it is 115/67. His cholesterol was changed to Lipitor because of cost concerns and TriCor was recently discontinued by his primary physician. Last lipid profile checked in November 2016 was fine. He had a carotid ultrasound done on 11/16/13 which showed chronic right ICA occlusion with some attempt at collateralization. No significant left-sided stenosis. He remains on Plavix which is tolerating well without bruising bleeding or other side effects. He has no new complaints today. ROS:   14 system review of systems is positive for itching, gait difficulty  , speech difficulty, ringing in the ears and all other systems negative  PMH:  Past Medical History  Diagnosis Date  . Hypertension   . High cholesterol   . Stroke ([place])   . Carotid stent occlusion [facility])     Social History:  Social History   Social History  . Marital Status: Married    Spouse Name: chris  .  Number of Children: 0  . Years of Education: college   Occupational History  . Not on file.   Social History Main Topics  . Smoking status: Never Smoker   . Smokeless tobacco: Never Used  . Alcohol Use: 1.8 oz/week    0 Standard drinks or equivalent, 1 Shots of liquor, 1 Glasses of wine, 1 Cans of beer per week  . Drug Use: No  . Sexual Activity: Not on file   Other Topics Concern  . Not on file   Social History Narrative   Patient is married, no children.   Patient is-- handed.   Patient has college education.   Patient drinks -cups daily.    Medications:   Current Outpatient Prescriptions on File Prior to Visit  Medication Sig Dispense Refill  . allopurinol (ZYLOPRIM) 100 MG tablet Take 100 mg by mouth daily.     Marland Kitchen buPROPion (WELLBUTRIN XL) 150 MG 24 hr tablet Take 150 mg by mouth daily.    . Calcium Carb-Cholecalciferol ([employer] CALCIUM + D3) 600-500 MG-UNIT CAPS Take 1 tablet by mouth daily.     . Cholecalciferol (VITAMIN D) 2000 UNITS tablet Take 2,000 Units by mouth daily.    . clopidogrel (PLAVIX) 75 MG tablet Take 75 mg by mouth daily.     . colchicine 0.6 MG tablet Take 0.6 mg by mouth as needed.    . folic acid (FOLVITE) A999333 MCG tablet Take 400 mcg by mouth daily.    . pindolol (VISKEN) 5 MG tablet Take 5 mg by mouth daily.    . tamsulosin (FLOMAX) 0.4 MG CAPS capsule Take 0.4 mg by mouth daily.     No current facility-administered medications on file prior to visit.    Allergies:   Allergies  Allergen Reactions  . Sulphadimidine [Sulfamethazine]     Physical Exam General: well developed, well nourished Caucasian male, seated, in no evident distress Head: head normocephalic and atraumatic. Orohparynx benign Neck: supple with no carotid or supraclavicular bruits Cardiovascular: regular rate and rhythm, no murmurs Musculoskeletal: no deformity Skin:  no rash/petichiae Vascular:  Normal pulses all extremities Filed Vitals:   08/31/15 1015  BP: 115/67    Pulse: 71   Neurologic Exam Mental Status: Awake and fully alert. Oriented to place and time. Recent and remote memory intact. Attention span, concentration and fund of knowledge appropriate. Mood and affect appropriate.  Cranial Nerves: Fundoscopic exam not done . Pupils equal, briskly reactive to light. Extraocular movements full without nystagmus. Visual fields full to confrontation. Hearing intact. Voice is slightly hypophonic. Facial sensation intact. Face, tongue, palate moves normally and symmetrically.  Motor: Normal bulk and tone. Normal strength in all tested extremity muscles. Diminished fine finger movements on the left. Orbits right over left upper extremity.Increased tone on the left side with mild spasticity Sensory.: intact to touch and pinprick and vibratory sensation.  Coordination: Rapid alternating movements normal in all extremities. Finger-to-nose and heel-to-shin performed accurately bilaterally. Gait and Station: Arises from chair without difficulty. Stance is normal. Gait demonstrates normal stride length and balance but slight stiffness and dragging of left leg . Able to heel, toe and tandem walk without difficulty.  Reflexes: 1+  and symmetric. Toes downgoing.      ASSESSMENT: 75 year old Caucasian male with remote history of right hemispheric infarct in March 2011 terminal right ICA occlusion and proximal right ICA stenosis treated with IV plus IA TPA and mechanical thrombectomy with Merci device with full recanalization and also needing rescue right ICA angioplasty/stenting for proximal right ICA stenosis which appeared occluded post procedure fortunately without clinical worsening. Risk factors of hypertension and hyperlipidemia. Patient completed participating in the IRIS stroke trial.     PLAN:   I had a long d/w patient and his wife about his remote stroke, risk for recurrent stroke/TIAs, personally independently reviewed imaging studies and stroke evaluation  results and answered questions.Continue Plavix  for secondary stroke prevention and maintain strict control of hypertension with blood pressure goal below 130/90, diabetes with hemoglobin A1c goal below 6.5% and lipids with LDL cholesterol goal below 70 mg/dL. I also advised the patient to eat a healthy diet with plenty of whole grains, cereals, fruits and vegetables, exercise regularly and maintain ideal body weight. Check follow-up screening carotid ultrasound and transcranial Doppler studies. Followup in the future with me in one year or call earlier if necessary. Antony Contras, MD Note: This document was prepared with digital dictation and possible smart phrase technology. Any transcriptional errors that result from this process are unintentional

## 2015-09-07 DIAGNOSIS — B3749 Other urogenital candidiasis: Secondary | ICD-10-CM | POA: Insufficient documentation

## 2015-11-15 ENCOUNTER — Ambulatory Visit (INDEPENDENT_AMBULATORY_CARE_PROVIDER_SITE_OTHER): Payer: Medicare Other

## 2015-11-15 DIAGNOSIS — I63231 Cerebral infarction due to unspecified occlusion or stenosis of right carotid arteries: Secondary | ICD-10-CM

## 2015-12-12 ENCOUNTER — Ambulatory Visit: Payer: Medicare Other | Attending: Internal Medicine

## 2015-12-12 DIAGNOSIS — R278 Other lack of coordination: Secondary | ICD-10-CM | POA: Insufficient documentation

## 2015-12-12 NOTE — Addendum Note (Signed)
Addended by: Mariane Masters on: 12/12/2015 06:56 PM   Modules accepted: Orders

## 2015-12-12 NOTE — Therapy (Addendum)
[place] MAIN [facility] SERVICES [address] [place], [place], [address] Phone: [phone]   Fax:  [phone]  Physical Therapy Evaluation  Patient Details  Name: Troy Pugh. MRN: [id number] Date of Birth: 1940/09/19 Referring Provider: Dr. Doy Hutching  Encounter Date: 12/12/2015      PT End of Session - 12/12/15 1646    Visit Number 1   Number of Visits 9   Date for PT Re-Evaluation 01/09/16   Authorization Type 1/10   PT Start Time 1525   PT Stop Time 1635   PT Time Calculation (min) 70 min   Equipment Utilized During Treatment Gait belt   Activity Tolerance Patient tolerated treatment well   Behavior During Therapy WFL for tasks assessed/performed      Past Medical History  Diagnosis Date  . Hypertension   . High cholesterol   . Stroke ([place])   . Carotid stent occlusion [facility])     Past Surgical History  Procedure Laterality Date  . Cataract extraction    . Gallbladder surgery      There were no vitals filed for this visit.       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 He was previously treated for his stroke but feels his LLE and walking have progressively weakened and become more unsteady. He reports having 3 falls within the last 6 months. His most recent occurred while in his yard and he lost his balance when trying to turn around. He does not use an assistive device and he denies any numbness or tingling in LLE.   Pertinent History CVA 2011   Limitations Walking   Patient Stated Goals try to get walking as best I can   Currently in Pain? No/denies            [facility] PT Assessment - 12/12/15 0001    Assessment   Medical Diagnosis LLE weakness; CVA   Referring Provider Dr. Doy Hutching   Onset Date/Surgical Date 12/11/09   Next MD Visit 06/12/2016   Prior Therapy CVA 2011   Precautions   Precautions Fall   Balance Screen   Has the patient fallen  in the past 6 months Yes   How many times? 3   Has the patient had a decrease in activity level because of a fear of falling?  Yes   Is the patient reluctant to leave their home because of a fear of falling?  No   Home Ecologist residence   Living Arrangements Spouse/significant other   Available Help at Discharge Family   Type of [place] to enter   Entrance Stairs-Number of Steps 3   Entrance Stairs-Rails [place] One level   [place] None   Prior Function   Level of [place] Retired   Tone   Assessment Location Left Lower Extremity   6 minute walk test results    Aerobic Endurance Distance Walked 1280   Standardized Balance Assessment   Standardized Balance Assessment Berg Balance Test;Dynamic Gait Index;Five Times Sit to Stand;10 meter walk test   Five times sit to stand comments  15.67 s   10 Meter Walk 1.1 m/s   Berg Balance Test   Sit to Stand Able to stand without using hands and stabilize independently   Standing Unsupported Able to stand safely 2 minutes  Sitting with Back Unsupported but Feet Supported on Floor or Stool Able to sit safely and securely 2 minutes   Stand to Sit Sits safely with minimal use of hands   Transfers Able to transfer safely, minor use of hands   Standing Unsupported with Eyes Closed Able to stand 10 seconds safely   Standing Ubsupported with Feet Together Able to place feet together independently and stand 1 minute safely   From Standing, Reach Forward with Outstretched Arm Can reach confidently >25 cm (10")   From Standing Position, Pick up Object from Floor Able to pick up shoe safely and easily   From Standing Position, Turn to Look Behind Over each Shoulder Looks behind from both sides and weight shifts well   Turn 360 Degrees Able to turn 360 degrees safely one side only in 4 seconds or less   Standing Unsupported, Alternately Place Feet  on Step/Stool Able to stand independently and safely and complete 8 steps in 20 seconds   Standing Unsupported, One Foot in Front Able to place foot tandem independently and hold 30 seconds   Standing on One Leg Able to lift leg independently and hold 5-10 seconds   Total Score 54   Functional Gait  Assessment   Gait assessed  Yes   Gait Level Surface Walks 20 ft in less than 7 sec but greater than 5.5 sec, uses assistive device, slower speed, mild gait deviations, or deviates 6-10 in outside of the 12 in walkway width.   Change in Gait Speed Able to change speed, demonstrates mild gait deviations, deviates 6-10 in outside of the 12 in walkway width, or no gait deviations, unable to achieve a major change in velocity, or uses a change in velocity, or uses an assistive device.   Gait with Horizontal Head Turns Performs head turns smoothly with no change in gait. Deviates no more than 6 in outside 12 in walkway width   Gait with Vertical Head Turns Performs task with slight change in gait velocity (eg, minor disruption to smooth gait path), deviates 6 - 10 in outside 12 in walkway width or uses assistive device   Gait and Pivot Turn Pivot turns safely in greater than 3 sec and stops with no loss of balance, or pivot turns safely within 3 sec and stops with mild imbalance, requires small steps to catch balance.   Step Over Obstacle Is able to step over 2 stacked shoe boxes taped together (9 in total height) without changing gait speed. No evidence of imbalance.   Gait with Narrow Base of Support Is able to ambulate for 10 steps heel to toe with no staggering.   Gait with Eyes Closed Walks 20 ft, slow speed, abnormal gait pattern, evidence for imbalance, deviates 10-15 in outside 12 in walkway width. Requires more than 9 sec to ambulate 20 ft.   Ambulating Backwards Walks 20 ft, uses assistive device, slower speed, mild gait deviations, deviates 6-10 in outside 12 in walkway width.   Steps Alternating  feet, no rail.   Total Score 23   LLE Tone   LLE Tone Modified Ashworth   LLE Tone   Modified Ashworth Scale for Grading Hypertonia LLE More marked increase in muscle tone through most of the ROM, but affected part(s) easily moved      REFLEXES Bil L3: 2+ bil L4: diminished  SENSATION WNL throughout BLE  SPASTICITY/TONE Ashworth scale: 2+ L ankle DF  COORDINATION/PROPRIOCEPTION RAMPs: alternating toe tapping while seated; difficulty with LLE  Proprioception: WNL LLE  VISUAL FIELD WNL throughout  COGNITION Intact; A&Ox4  CRANIAL NERVES Grossly intact  ROM (in deg)  Left Right  Hip flexion    Hip extension    Hip IR/ER    Hip abduction    Hip adduction    Knee flexion    Knee extension    Ankle DF +1   Ankle PF 40    STRENGTH (out of 5)  Left Right  Hip flexion 4+ 4+  Hip extension 3+ 4  Hip IR/ER    Hip abduction 3+ 4  Hip adduction 3+ 4+  Knee flexion 5 5  Knee extension 5 5  Ankle DF 3+ 5  Ankle PF     SPECIAL TESTS Clonus: negative Ely's: bil +  GAIT/MOBILITY Decreased heel strike/increased foot flat on L, L hip circumduction during swing  FUNCTIONAL MOVEMENTS Ambulates stairs reciprocally and without use of handrails Able to sit <> stand with slight increased time to complete task  LEFS score: 45/80       PT Education - 12/12/15 1646    Education provided Yes   Education Details HEP, POC, exam findings   Person(s) Educated Patient   Methods Explanation;Demonstration   Comprehension Verbalized understanding;Returned demonstration             PT Long Term Goals - 12/12/15 1658    PT LONG TERM GOAL #1   Title pt will increase FGA score by >5 points to demonstrate improved dynamic balance and decrease risk of falls.   Baseline 23   Time 4   Period Weeks   Status New   PT LONG TERM GOAL #2   Title pt will walk uneven ground for 50 ft without toe drag to perform yard work and decrease risk of falls.   Time 4   Period Weeks    Status New   PT LONG TERM GOAL #3   Title pt will be independent with HEP to further increase strength and ROM to improve overall function.   Time 4   Period Weeks   Status New   PT LONG TERM GOAL #4   Title pt will increase LEFS score by > 9 points to demonstrate improved overall function and decrease risk of falls.   Baseline 45   Time 4   Period Weeks   Status New               Plan - 12/12/15 1647    Clinical Impression Statement pt is 75 YO M s/p CVA in 2011 who presents with residual spasticity in his LUE and LLE. Pt has deficits in bil hip strength, L ankle strength, L ankle ROM, gait and dynamic balance deviations. 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 He demonstrates decreased heel strike, increased toe drag, and increased hip circumduction during swing on LLE. pt also demonstrates decreased coordination of LLE. pt needs skilled PT intervention to address deficits to maximize overall function and decrease risk of falls.   Rehab Potential Good   Clinical Impairments Affecting Rehab Potential chronicity of issue, spasticity L gastroc/soleus   PT Frequency 2x / week   PT Duration 4 weeks   PT Treatment/Interventions ADLs/Self Care Home Management;Electrical Stimulation;Gait training;Functional mobility training;Therapeutic exercise;Therapeutic activities;Balance training;Neuromuscular re-education;Patient/family education;Manual techniques;Passive range of motion   PT Next Visit Plan dynamic balance, gait training, stretching, strengthening   Consulted and Agree with Plan of Care Patient  Patient will benefit from skilled therapeutic intervention in order to improve the following deficits and impairments:  Abnormal gait, Decreased balance, Decreased coordination  Visit Diagnosis: Other lack of coordination - Plan: PT plan of care cert/re-cert      G-Codes - [phone]    Functional  Assessment Tool Used FGA/Berg/47minwalk   Functional Limitation Mobility: Walking and moving around   Mobility: Walking and Moving Around Current Status [phone]) At least 1 percent but less than 20 percent impaired, limited or restricted   Mobility: Walking and Moving Around Goal Status [phone]) At least 1 percent but less than 20 percent impaired, limited or restricted       Problem List Patient Active Problem List   Diagnosis Date Noted  . Occlusion and stenosis of carotid artery with cerebral infarction 07/27/2013   Geraldine Solar, SPT This entire session was performed under direct supervision and direction of a licensed therapist/therapist assistant . I have personally read, edited and approve of the note as written. Gorden Harms. Tortorici, PT, DPT [phone]  Tortorici,Ashley 12/12/2015, 6:56 PM  [place] MAIN [facility] SERVICES [address] [place], [place], [address] Phone: [phone]   Fax:  [phone]  Name: Troy Pugh. MRN: [id number] Date of Birth: 06-02-1941

## 2015-12-17 ENCOUNTER — Ambulatory Visit: Payer: Medicare Other

## 2015-12-17 DIAGNOSIS — R278 Other lack of coordination: Secondary | ICD-10-CM

## 2015-12-17 NOTE — Therapy (Signed)
[place] MAIN [facility] SERVICES [address] [place], [place], [address] Phone: [phone]   Fax:  [phone]  Physical Therapy Treatment  Patient Details  Name: Troy Pugh. MRN: [id number] Date of Birth: 12/08/1940 Referring Provider: Dr. Doy Hutching  Encounter Date: 12/17/2015      PT End of Session - 12/17/15 1100    Visit Number 2   Number of Visits 9   Date for PT Re-Evaluation 01/09/16   Authorization Type 2/10   PT Start Time 1040   PT Stop Time 1125   PT Time Calculation (min) 45 min   Equipment Utilized During Treatment Gait belt   Activity Tolerance Patient tolerated treatment well   Behavior During Therapy [facility] for tasks assessed/performed      Past Medical History  Diagnosis Date  . Hypertension   . High cholesterol   . Stroke ([place])   . Carotid stent occlusion [facility])     Past Surgical History  Procedure Laterality Date  . Cataract extraction    . Gallbladder surgery      There were no vitals filed for this visit.      Subjective Assessment - 12/17/15 1044    Subjective "I'm doing alright."   Pertinent History CVA 2011   Limitations Walking   Patient Stated Goals try to get walking as best I can   Currently in Pain? No/denies       THEREX Nustep L4, LE only x 5 min (unbilled) Resisted walking fwd/retro with 7.5# x 10 each; mod cues to increase LLE foot clearance, CGA for balance Resisted bil side stepping with 7.5# x 5 each; mod cues to clear LLE; CGA throughout and 1 slight LOB which pt able to recover with CGA.  NMR small rockerboard bil staggered stance weight shifts ant/post no UE support and dual task (talking on phone) x 12 min ball toss with staggered stance keeping anterior weight shift x 10 min; most difficult with superior toss L>R  GAIT TRAINING stepping over 1/2 foam roll with LLE with and without UE support x 8 min; increased posterior lean without UE support and 1 slight LOB; CGA for  balance without UE support        PT Long Term Goals - 12/12/15 1658    PT LONG TERM GOAL #1   Title pt will increase FGA score by >5 points to demonstrate improved dynamic balance and decrease risk of falls.   Baseline 23   Time 4   Period Weeks   Status New   PT LONG TERM GOAL #2   Title pt will walk uneven ground for 50 ft without toe drag to perform yard work and decrease risk of falls.   Time 4   Period Weeks   Status New   PT LONG TERM GOAL #3   Title pt will be independent with HEP to further increase strength and ROM to improve overall function.   Time 4   Period Weeks   Status New   PT LONG TERM GOAL #4   Title pt will increase LEFS score by > 9 points to demonstrate improved overall function and decrease risk of falls.   Baseline 45   Time 4   Period Weeks   Status New               Plan - 12/17/15 1127    Clinical Impression Statement pt making progress towards goals. pt demonstrated increased posterior weight shift and difficulty shifting weight  anterior during dynamic balance exercises today. pt able to perform dual tasks during balance exercises with slight increased difficulty maintaining balance. pt also had difficulty clearing LLE and maintaining steadiness on feet during resisted walking.  pt needs continued skilled intervention to maximize independence and decrease risk of falls.    Rehab Potential Good   Clinical Impairments Affecting Rehab Potential chronicity of issue, spasticity L gastroc/soleus   PT Frequency 2x / week   PT Duration 4 weeks   PT Treatment/Interventions ADLs/Self Care Home Management;Electrical Stimulation;Gait training;Functional mobility training;Therapeutic exercise;Therapeutic activities;Balance training;Neuromuscular re-education;Patient/family education;Manual techniques;Passive range of motion   PT Next Visit Plan dynamic balance, gait training, stretching, strengthening   Consulted and Agree with Plan of Care Patient       Patient will benefit from skilled therapeutic intervention in order to improve the following deficits and impairments:  Abnormal gait, Decreased balance, Decreased coordination  Visit Diagnosis: Other lack of coordination     Problem List Patient Active Problem List   Diagnosis Date Noted  . Occlusion and stenosis of carotid artery with cerebral infarction 07/27/2013   Geraldine Solar, SPT This entire session was performed under direct supervision and direction of a licensed therapist/therapist assistant . I have personally read, edited and approve of the note as written. Gorden Harms. Tortorici, PT, DPT [phone]   Tortorici,Ashley 12/17/2015, 12:35 PM  [place] MAIN [facility] SERVICES [address] [place], [place], [address] Phone: [phone]   Fax:  [phone]  Name: Troy Pugh. MRN: [id number] Date of Birth: 1941/05/18

## 2015-12-20 ENCOUNTER — Ambulatory Visit: Payer: Medicare Other

## 2015-12-20 DIAGNOSIS — R278 Other lack of coordination: Secondary | ICD-10-CM | POA: Diagnosis not present

## 2015-12-20 NOTE — Therapy (Signed)
[place] MAIN [facility] SERVICES [address] [place], [place], [address] Phone: [phone]   Fax:  [phone]  Physical Therapy Treatment  Patient Details  Name: Troy Pugh. MRN: [id number] Date of Birth: 1941/05/31 Referring Provider: Dr. Doy Hutching  Encounter Date: 12/20/2015      PT End of Session - 12/20/15 1707    Visit Number 3   Number of Visits 9   Date for PT Re-Evaluation 01/09/16   Authorization Type 3/10   PT Start Time 1620   PT Stop Time 1705   PT Time Calculation (min) 45 min   Equipment Utilized During Treatment Gait belt   Activity Tolerance Patient tolerated treatment well   Behavior During Therapy WFL for tasks assessed/performed      Past Medical History  Diagnosis Date  . Hypertension   . High cholesterol   . Stroke ([place])   . Carotid stent occlusion [facility])     Past Surgical History  Procedure Laterality Date  . Cataract extraction    . Gallbladder surgery      There were no vitals filed for this visit.      Subjective Assessment - 12/20/15 1624    Subjective Pt states that he feels good today.   Pertinent History CVA 2011   Limitations Walking   Patient Stated Goals try to get walking as best I can   Currently in Pain? No/denies     THEREX Nustep L4, LE only x 5 min (unbilled) resisted walking fwd/retro, bil side stepping x 5 laps each with 12.5#; decreased control with return in all directions, 2 slight LOB  NMR Bil cone taps (x3) with airex pad, 2x5 BLE; CGA - min A throughout, more difficult when balancing on RLE LLE D2 hip flexion PNF AAROM (x10) and resisted (2x10); pt demonstrated fatigue towards end of exercise  GAIT TRAINING stepping over 1/2 foam roll without UE support, 1 x 15 LLE; improved balance from previous session but had difficulty clearing L foot towards end of exercise; CGA Backwards walking in hallway 180 ft x 2 to promote hip extension during gait; min verbal cues to  increase knee flexion and hip extension during backwards walking; CGA; pt demonstrated improved hip extension with fwd walking after         PT Education - 12/20/15 1706    Education provided Yes   Education Details weight shift, exercise technique, gait pattern   Person(s) Educated Patient   Methods Explanation   Comprehension Verbalized understanding             PT Long Term Goals - 12/12/15 1658    PT LONG TERM GOAL #1   Title pt will increase FGA score by >5 points to demonstrate improved dynamic balance and decrease risk of falls.   Baseline 23   Time 4   Period Weeks   Status New   PT LONG TERM GOAL #2   Title pt will walk uneven ground for 50 ft without toe drag to perform yard work and decrease risk of falls.   Time 4   Period Weeks   Status New   PT LONG TERM GOAL #3   Title pt will be independent with HEP to further increase strength and ROM to improve overall function.   Time 4   Period Weeks   Status New   PT LONG TERM GOAL #4   Title pt will increase LEFS score by > 9 points to demonstrate improved overall function  and decrease risk of falls.   Baseline 45   Time 4   Period Weeks   Status New               Plan - 12/20/15 1707    Clinical Impression Statement pt continuing to make progress and tolerated progressed balance exercises well. He continues to demonstrate balance deficits with dynamic balance exercises and deficits in hip strength as demonstrated by unsteadiness with resisted walking. pt continues to have decreased hip extension on LLE during gait. pt needs continued skilled PT intervention to address remaining deficits in order to maximize independence and overall function.   Rehab Potential Good   Clinical Impairments Affecting Rehab Potential chronicity of issue, spasticity L gastroc/soleus   PT Frequency 2x / week   PT Duration 4 weeks   PT Treatment/Interventions ADLs/Self Care Home Management;Electrical Stimulation;Gait  training;Functional mobility training;Therapeutic exercise;Therapeutic activities;Balance training;Neuromuscular re-education;Patient/family education;Manual techniques;Passive range of motion   PT Next Visit Plan dynamic balance, gait training, stretching, strengthening   Consulted and Agree with Plan of Care Patient      Patient will benefit from skilled therapeutic intervention in order to improve the following deficits and impairments:  Abnormal gait, Decreased balance, Decreased coordination  Visit Diagnosis: Other lack of coordination     Problem List Patient Active Problem List   Diagnosis Date Noted  . Occlusion and stenosis of carotid artery with cerebral infarction 07/27/2013   Geraldine Solar, SPT This entire session was performed under direct supervision and direction of a licensed therapist/therapist assistant . I have personally read, edited and approve of the note as written. Gorden Harms. Tortorici, PT, DPT [phone]    Tortorici,Ashley 12/20/2015, 5:20 PM  [place] MAIN [facility] SERVICES [address] [place], [place], [address] Phone: [phone]   Fax:  [phone]  Name: Troy Pugh. MRN: [id number] Date of Birth: December 21, 1940

## 2015-12-25 ENCOUNTER — Ambulatory Visit: Payer: Medicare Other

## 2015-12-25 DIAGNOSIS — R278 Other lack of coordination: Secondary | ICD-10-CM

## 2015-12-25 NOTE — Therapy (Signed)
[place] MAIN [facility] SERVICES [address] [place], [place], [address] Phone: [phone]   Fax:  [phone]  Physical Therapy Treatment  Patient Details  Name: Troy Pugh. MRN: [id number] Date of Birth: 09-Jan-1941 Referring Provider: Dr. Doy Hutching  Encounter Date: 12/25/2015      PT End of Session - 12/25/15 1652    Visit Number 4   Number of Visits 9   Date for PT Re-Evaluation 01/09/16   Authorization Type 4/10   PT Start Time 1648   PT Stop Time 1730   PT Time Calculation (min) 42 min   Equipment Utilized During Treatment Gait belt   Activity Tolerance Patient tolerated treatment well   Behavior During Therapy [facility] for tasks assessed/performed      Past Medical History  Diagnosis Date  . Hypertension   . High cholesterol   . Stroke ([place])   . Carotid stent occlusion [facility])     Past Surgical History  Procedure Laterality Date  . Cataract extraction    . Gallbladder surgery      There were no vitals filed for this visit.      Subjective Assessment - 12/25/15 1651    Subjective pt reports that he feels his walking has improved except for when he gets tired.   Pertinent History CVA 2011   Limitations Walking   Patient Stated Goals try to get walking as best I can   Currently in Pain? No/denies      THEREX Nustep L4, LE only x 5 min (unbilled)   NMR Bil fwd and lateral heel taps on 4" step with no UE support and RTB around R knee to decrease knee valgus in order to improve balance and strength, 1x20 each way; min verbal cues to decrease weight shift onto heel and CGA Staggered stance with LLE extended back and on dynadisc with balloon taps against mirror x 2x4 min; CGA - min A throughout for unsteadiness Bil staggered stance with front foot on soccer ball with balloon taps against mirror, 1x5 min each; CGA - min A throughout for slight unsteadiness Bil toe taps on 6" step while on airex pad, 2x20 each; CGA for  increased postural sway throughout  GAIT TRAINING stepping backwards over 1/2 foam roll with LLE to facilitate increased hip extension with fwd gait, 2x20; SBA, min verbal cues for proper technique Backwards walking in hallway emphasizing L knee flexion and hip extension 127ft x 1 Resisted backwards walking with RTB in hallway 187ft x 2; CGA; mod verbal cues to increase L knee flexion and hip extension          PT Education - 12/25/15 1651    Education provided Yes   Education Details weight shifting, gait   Person(s) Educated Patient   Methods Explanation   Comprehension Verbalized understanding             PT Long Term Goals - 12/12/15 1658    PT LONG TERM GOAL #1   Title pt will increase FGA score by >5 points to demonstrate improved dynamic balance and decrease risk of falls.   Baseline 23   Time 4   Period Weeks   Status New   PT LONG TERM GOAL #2   Title pt will walk uneven ground for 50 ft without toe drag to perform yard work and decrease risk of falls.   Time 4   Period Weeks   Status New   PT LONG TERM GOAL #3  Title pt will be independent with HEP to further increase strength and ROM to improve overall function.   Time 4   Period Weeks   Status New   PT LONG TERM GOAL #4   Title pt will increase LEFS score by > 9 points to demonstrate improved overall function and decrease risk of falls.   Baseline 45   Time 4   Period Weeks   Status New               Plan - 12/25/15 1737    Clinical Impression Statement pt continues to make progress in gait and dynamic balance. pt demonstrated mild carry-over with gait following last session but continues to walk with decreased hip extension and foot clearance on L. pt unsteady with progressed dynamic balance exercises but able to recover with min A. pt needs continued skilled PT intervention to address remaining deficits in order to maximize overall function and decrease risk of falls.   Rehab Potential Good    Clinical Impairments Affecting Rehab Potential chronicity of issue, spasticity L gastroc/soleus   PT Frequency 2x / week   PT Duration 4 weeks   PT Treatment/Interventions ADLs/Self Care Home Management;Electrical Stimulation;Gait training;Functional mobility training;Therapeutic exercise;Therapeutic activities;Balance training;Neuromuscular re-education;Patient/family education;Manual techniques;Passive range of motion   PT Next Visit Plan dynamic balance, gait training, stretching, strengthening   Consulted and Agree with Plan of Care Patient      Patient will benefit from skilled therapeutic intervention in order to improve the following deficits and impairments:  Abnormal gait, Decreased balance, Decreased coordination  Visit Diagnosis: Other lack of coordination     Problem List Patient Active Problem List   Diagnosis Date Noted  . Occlusion and stenosis of carotid artery with cerebral infarction 07/27/2013    Geraldine Solar, SPT This entire session was performed under direct supervision and direction of a licensed therapist/therapist assistant . I have personally read, edited and approve of the note as written. Gorden Harms. Tortorici, PT, DPT [phone]  Tortorici,Ashley 12/26/2015, 9:14 AM  [place] MAIN [facility] SERVICES [address] [place], [place], [address] Phone: [phone]   Fax:  [phone]  Name: Troy Pugh. MRN: [id number] Date of Birth: 1941-05-25

## 2015-12-27 ENCOUNTER — Ambulatory Visit: Payer: Medicare Other

## 2015-12-27 DIAGNOSIS — R278 Other lack of coordination: Secondary | ICD-10-CM | POA: Diagnosis not present

## 2015-12-27 NOTE — Therapy (Signed)
[place] MAIN [facility] SERVICES [address] [place], [place], [address] Phone: [phone]   Fax:  [phone]  Physical Therapy Treatment  Patient Details  Name: Troy Pugh. MRN: [id number] Date of Birth: February 17, 1941 Referring Provider: Dr. Doy Hutching  Encounter Date: 12/27/2015      PT End of Session - 12/27/15 1735    Visit Number 5   Number of Visits 9   Date for PT Re-Evaluation 01/09/16   Authorization Type 5/10   PT Start Time 1645   PT Stop Time 1730   PT Time Calculation (min) 45 min   Equipment Utilized During Treatment Gait belt   Activity Tolerance Patient tolerated treatment well   Behavior During Therapy [facility] for tasks assessed/performed      Past Medical History  Diagnosis Date  . Hypertension   . High cholesterol   . Stroke ([place])   . Carotid stent occlusion [facility])     Past Surgical History  Procedure Laterality Date  . Cataract extraction    . Gallbladder surgery      There were no vitals filed for this visit.      Subjective Assessment - 12/27/15 1735    Subjective "So far so good." No new complaints after last treatment session.   Pertinent History CVA 2011   Limitations Walking   Patient Stated Goals try to get walking as best I can   Currently in Pain? No/denies       THEREX Nustep L4 LE only x 5 min (unbilled) wall slides with green therapy ball, 2x10 with SBA wall slides with green therapy ball, 2x5 with 5 sec hold at the bottom; CGA for balance  NMR bil stance on bosu no UE support, 10x30 sec hold; CGA - min A, demonstrated increased posterior weight shift staggered stance on small rocker board with LLE fwd, maintaining weight shifted fwd with lifts and chops with 2# weight bar, 2x10 each; CGA for balance, increased difficulty to maintain weight shifted forward over L bil stance on small rockerboard with weight shifted onto LLE, 2# weight bar chest press and sup/inf raises, 2x10 each; CGA for  balance and increased difficulty to keep weight shifted L L SLS on airex pad with no UE support, 10x15 sec holds; CGA - min A for balance  GAIT TRAINING Resisted retro walking with RTB emphasizing L knee flexion and hip extension 166ftx2; CGA and mod verbal cues to increase both knee flexion and hip extension Walking with increased R step length to facilitate L hip extension 17ftx2; mod verbal cues for technique Walking 179ftx2 to assess carry over from exercises; pt with notable increased hip extension on L throughout gait        PT Education - 12/27/15 1735    Education provided Yes   Education Details gait pattern, proper weight shifting   Person(s) Educated Patient   Methods Explanation             PT Long Term Goals - 12/12/15 1658    PT LONG TERM GOAL #1   Title pt will increase FGA score by >5 points to demonstrate improved dynamic balance and decrease risk of falls.   Baseline 23   Time 4   Period Weeks   Status New   PT LONG TERM GOAL #2   Title pt will walk uneven ground for 50 ft without toe drag to perform yard work and decrease risk of falls.   Time 4   Period Weeks  Status New   PT LONG TERM GOAL #3   Title pt will be independent with HEP to further increase strength and ROM to improve overall function.   Time 4   Period Weeks   Status New   PT LONG TERM GOAL #4   Title pt will increase LEFS score by > 9 points to demonstrate improved overall function and decrease risk of falls.   Baseline 45   Time 4   Period Weeks   Status New               Plan - 12/27/15 1736    Clinical Impression Statement pt continues to demonstrate improvement as evidenced by his ability to perfom progressed strengthening and balance activities. pt still demonstrates decreased ability to shift weight over LLE and tends to keep his weight over RLE. pt gait is continuing to improve but he still has decreased hip extension on L. pt needs continued skilled PT intervention  to address remaining deficits in order to maximize overall function.   Rehab Potential Good   Clinical Impairments Affecting Rehab Potential chronicity of issue, spasticity L gastroc/soleus   PT Frequency 2x / week   PT Duration 4 weeks   PT Treatment/Interventions ADLs/Self Care Home Management;Electrical Stimulation;Gait training;Functional mobility training;Therapeutic exercise;Therapeutic activities;Balance training;Neuromuscular re-education;Patient/family education;Manual techniques;Passive range of motion   PT Next Visit Plan dynamic balance, gait training, stretching, strengthening   Consulted and Agree with Plan of Care Patient      Patient will benefit from skilled therapeutic intervention in order to improve the following deficits and impairments:  Abnormal gait, Decreased balance, Decreased coordination  Visit Diagnosis: Other lack of coordination     Problem List Patient Active Problem List   Diagnosis Date Noted  . Occlusion and stenosis of carotid artery with cerebral infarction 07/27/2013   Geraldine Solar, SPT This entire session was performed under direct supervision and direction of a licensed therapist/therapist assistant . I have personally read, edited and approve of the note as written. Gorden Harms. Tortorici, PT, DPT [phone]   Tortorici,Ashley 12/27/2015, 6:07 PM  [place] MAIN [facility] SERVICES [address] [place], [place], [address] Phone: [phone]   Fax:  [phone]  Name: Marlos Thiery. MRN: [id number] Date of Birth: 08-12-1940

## 2016-01-02 ENCOUNTER — Ambulatory Visit: Payer: Medicare Other | Attending: Internal Medicine

## 2016-01-02 DIAGNOSIS — R278 Other lack of coordination: Secondary | ICD-10-CM | POA: Insufficient documentation

## 2016-01-02 NOTE — Therapy (Signed)
[place] MAIN [facility] SERVICES [address] [place], [place], [address] Phone: [phone]   Fax:  [phone]  Physical Therapy Treatment  Patient Details  Name: Troy Pugh. MRN: [id number] Date of Birth: 29-Nov-1940 Referring Provider: Dr. Doy Hutching  Encounter Date: 01/02/2016      PT End of Session - 01/02/16 1011    Visit Number 6   Number of Visits 9   Date for PT Re-Evaluation 01/09/16   Authorization Type 5/10   PT Start Time 0948   PT Stop Time 1030   PT Time Calculation (min) 42 min   Equipment Utilized During Treatment Gait belt   Activity Tolerance Patient tolerated treatment well   Behavior During Therapy [facility] for tasks assessed/performed      Past Medical History  Diagnosis Date  . Hypertension   . High cholesterol   . Stroke ([place])   . Carotid stent occlusion [facility])     Past Surgical History  Procedure Laterality Date  . Cataract extraction    . Gallbladder surgery      There were no vitals filed for this visit.      Subjective Assessment - 01/02/16 0954    Subjective pt states that he did not have any falls over the weekend and feels that he is improving.   Pertinent History CVA 2011   Limitations Walking   Patient Stated Goals try to get walking as best I can   Currently in Pain? No/denies      THEREX Nustep L4 LE only x 5 min wall slides with big therapy ball, 2x10; min verbal cues for technique, CGA for balance wall sits (x 5 sec) with big therapy ball, 1x10; CGA for balance bil fwd/lateral lunges with front foot on bosu 1x10 each; CGA for balance, min verbal cues for technique fwd/retro monster walks with RTB 15 ft x 3 laps each; CGA for balance, min verbal cues for technique  NMR bil tandem stance on 1/2 foam roll (upside down) with no UE support, 3x1 min each; CGA - min A for balance; decreased ankle strategy noted when LLE back, more difficulty balance with LLE back L SLS on airex with no UE  support, 15x5 sec hold to facilitate ankle strategy for balance; min A for balance, increased posterior weight shift noted, pt cued to increase L knee flexion to increase anterior weight shift          PT Education - 01/02/16 1011    Education provided Yes   Education Details exercise technique   Person(s) Educated Patient   Methods Explanation   Comprehension Verbalized understanding             PT Long Term Goals - 12/12/15 1658    PT LONG TERM GOAL #1   Title pt will increase FGA score by >5 points to demonstrate improved dynamic balance and decrease risk of falls.   Baseline 23   Time 4   Period Weeks   Status New   PT LONG TERM GOAL #2   Title pt will walk uneven ground for 50 ft without toe drag to perform yard work and decrease risk of falls.   Time 4   Period Weeks   Status New   PT LONG TERM GOAL #3   Title pt will be independent with HEP to further increase strength and ROM to improve overall function.   Time 4   Period Weeks   Status New   PT LONG TERM  GOAL #4   Title pt will increase LEFS score by > 9 points to demonstrate improved overall function and decrease risk of falls.   Baseline 45   Time 4   Period Weeks   Status New               Plan - 01/02/16 1034    Clinical Impression Statement pt continues to make progress as demonstrated by his ability to participate in progressed balance and strengthening exercises. pt demonstrated decreased ankle strategies on L during balance exerices. pt still occassionally drags his L toe during gait but is able to correct with min verbal cues. pt needs continued skilled PT intervention to address remaining deficits to maximize independence and overall function.   Rehab Potential Good   Clinical Impairments Affecting Rehab Potential chronicity of issue, spasticity L gastroc/soleus   PT Frequency 2x / week   PT Duration 4 weeks   PT Treatment/Interventions ADLs/Self Care Home Management;Electrical  Stimulation;Gait training;Functional mobility training;Therapeutic exercise;Therapeutic activities;Balance training;Neuromuscular re-education;Patient/family education;Manual techniques;Passive range of motion   PT Next Visit Plan dynamic balance, gait training, stretching, strengthening   Consulted and Agree with Plan of Care Patient      Patient will benefit from skilled therapeutic intervention in order to improve the following deficits and impairments:  Abnormal gait, Decreased balance, Decreased coordination  Visit Diagnosis: Other lack of coordination     Problem List Patient Active Problem List   Diagnosis Date Noted  . Occlusion and stenosis of carotid artery with cerebral infarction 07/27/2013   Geraldine Solar, SPT This entire session was performed under direct supervision and direction of a licensed therapist/therapist assistant . I have personally read, edited and approve of the note as written. Gorden Harms. Tortorici, PT, DPT [phone]  Tortorici,Ashley 01/02/2016, 2:22 PM  [place] MAIN [facility] SERVICES [address] [place], [place], [address] Phone: [phone]   Fax:  [phone]  Name: Troy Pugh. MRN: [id number] Date of Birth: August 04, 1940

## 2016-01-07 ENCOUNTER — Ambulatory Visit: Payer: Medicare Other

## 2016-01-07 DIAGNOSIS — R278 Other lack of coordination: Secondary | ICD-10-CM

## 2016-01-07 NOTE — Therapy (Addendum)
[place] MAIN [facility] SERVICES [address] [place], [place], [address] Phone: [phone]   Fax:  [phone]  Physical Therapy Treatment/ progress note  Patient Details  Name: Troy Pugh. MRN: [id number] Date of Birth: [birth year] Referring Provider: Dr. Doy Hutching  Encounter Date: 01/07/2016      PT End of Session - 01/07/16 0950    Visit Number 7   Number of Visits 25   Date for PT Re-Evaluation 02/04/16   Authorization Type 1/10   PT Start Time 0945   PT Stop Time 1030   PT Time Calculation (min) 45 min   Equipment Utilized During Treatment Gait belt   Activity Tolerance Patient tolerated treatment well   Behavior During Therapy [facility] for tasks assessed/performed      Past Medical History  Diagnosis Date  . Hypertension   . High cholesterol   . Stroke ([place])   . Carotid stent occlusion [facility])     Past Surgical History  Procedure Laterality Date  . Cataract extraction    . Gallbladder surgery      There were no vitals filed for this visit.      Subjective Assessment - 01/07/16 0949    Subjective "So far so good."   Pertinent History CVA 2011   Limitations Walking   Patient Stated Goals try to get walking as best I can   Currently in Pain? No/denies            [facility] PT Assessment - 01/07/16 0001    Functional Gait  Assessment   Gait assessed  Yes   Gait Level Surface Walks 20 ft in less than 7 sec but greater than 5.5 sec, uses assistive device, slower speed, mild gait deviations, or deviates 6-10 in outside of the 12 in walkway width.   Change in Gait Speed Able to change speed, demonstrates mild gait deviations, deviates 6-10 in outside of the 12 in walkway width, or no gait deviations, unable to achieve a major change in velocity, or uses a change in velocity, or uses an assistive device.   Gait with Horizontal Head Turns Performs head turns smoothly with no change in gait. Deviates no more than 6 in outside 12  in walkway width   Gait with Vertical Head Turns Performs head turns with no change in gait. Deviates no more than 6 in outside 12 in walkway width.   Gait and Pivot Turn Pivot turns safely in greater than 3 sec and stops with no loss of balance, or pivot turns safely within 3 sec and stops with mild imbalance, requires small steps to catch balance.   Step Over Obstacle Is able to step over 2 stacked shoe boxes taped together (9 in total height) without changing gait speed. No evidence of imbalance.   Gait with Narrow Base of Support Is able to ambulate for 10 steps heel to toe with no staggering.   Gait with Eyes Closed Walks 20 ft, uses assistive device, slower speed, mild gait deviations, deviates 6-10 in outside 12 in walkway width. Ambulates 20 ft in less than 9 sec but greater than 7 sec.   Ambulating Backwards Walks 20 ft, no assistive devices, good speed, no evidence for imbalance, normal gait   Steps Alternating feet, no rail.   Total Score 26       THEREX Nustep L4, LE only x 5 min (unbilled) Outcome measures: FGA: low fall risk fwd walking in grass 2x27f, min verbal cues to increase  hip flexion bil side stepping in grass, 2x66f with CGA, mod verbal cues to increase hip flexion and toe clearance of LLE  NMR bil step ups wtih contralateral march on 6" step with airex pad with no UE, 2x10 each; min verbal cues to increase LLE hip flexion; CGA for balance retro monster walks in hallway, 2x20 ft; min verbal cues to increase L hip extension and decrease trunk flexion throughout         PT Education - 02017/07/120949    Education provided Yes   Education Details exercise, increase foot clearance   Person(s) Educated Patient   Methods Explanation   Comprehension Verbalized understanding             PT Long Term Goals - 0July 12, 20171307    PT LONG TERM GOAL #1   Title pt will increase FGA score by >5 points to demonstrate improved dynamic balance and decrease risk of  falls.   Baseline 26 (7/10)   Time 4   Period Weeks   Status Partially Met   PT LONG TERM GOAL #2   Title pt will walk uneven ground for 50 ft without toe drag to perform yard work and decrease risk of falls.   Time 4   Period Weeks   Status Partially Met   PT LONG TERM GOAL #3   Title pt will be independent with HEP to further increase strength and ROM to improve overall function.   Time 4   Period Weeks   Status Achieved   PT LONG TERM GOAL #4   Title pt will increase LEFS score by > 9 points to demonstrate improved overall function and decrease risk of falls.   Baseline 45   Time 4   Period Weeks   Status On-going               Plan - [id number]    Clinical Impression Statement SPT reassessed pt outcome measures today and pt has partially met all of his goals and met his HEP goal (LEFS will be assessd next session). pt continues to demonstrate intermittent toe drag during gait on even and unven ground, decreased hip extension on L, and mild deficits in dynamic balance. pt POC will  be extended for 1x/week for 4 more weeks to continue to improve gait, balance, and overall function.   Rehab Potential Good   Clinical Impairments Affecting Rehab Potential chronicity of issue, spasticity L gastroc/soleus   PT Frequency 2x / week   PT Duration 4 weeks   PT Treatment/Interventions ADLs/Self Care Home Management;Electrical Stimulation;Gait training;Functional mobility training;Therapeutic exercise;Therapeutic activities;Balance training;Neuromuscular re-education;Patient/family education;Manual techniques;Passive range of motion   PT Next Visit Plan dynamic balance, gait training, stretching, strengthening   Consulted and Agree with Plan of Care Patient      Patient will benefit from skilled therapeutic intervention in order to improve the following deficits and impairments:  Abnormal gait, Decreased balance, Decreased coordination  Visit Diagnosis: Other lack of  coordination - Plan: PT plan of care cert/re-cert       G-Codes - [id number]    Functional Assessment Tool Used FGA/5xsittostand   Functional Limitation Mobility: Walking and moving around   Mobility: Walking and Moving Around Current Status ((J4782 At least 1 percent but less than 20 percent impaired, limited or restricted   Mobility: Walking and Moving Around Goal Status ((N5621 At least 1 percent but less than 20 percent impaired, limited or restricted      Problem List Patient  Active Problem List   Diagnosis Date Noted  . Occlusion and stenosis of carotid artery with cerebral infarction 07/27/2013   Geraldine Solar, SPT This entire session was performed under direct supervision and direction of a licensed therapist/therapist assistant . I have personally read, edited and approve of the note as written. Gorden Harms. Tortorici, PT, DPT [phone]  Tortorici,Ashley 01/07/2016, 4:30 PM  [place] MAIN [facility] SERVICES [address] [place], [place], [address] Phone: [phone]   Fax:  [phone]  Name: Troy Pugh. MRN: [id number] Date of Birth: [birth year]

## 2016-01-09 ENCOUNTER — Ambulatory Visit: Payer: Medicare Other | Admitting: Physical Therapy

## 2016-01-09 DIAGNOSIS — R278 Other lack of coordination: Secondary | ICD-10-CM | POA: Diagnosis not present

## 2016-01-09 NOTE — Therapy (Addendum)
[place] MAIN [facility] SERVICES [address] [place], [place], [address] Phone: [phone]   Fax:  [phone]  Physical Therapy Treatment  Patient Details  Name: Troy Pugh. MRN: [id number] Date of Birth: 08/04/1940 Referring Provider: Dr. Doy Hutching  Encounter Date: 01/09/2016      PT End of Session - 01/09/16 0947    Visit Number 8   Number of Visits 25   Date for PT Re-Evaluation 02/04/16   Authorization Type 2/10   PT Start Time 0945   PT Stop Time 1025   PT Time Calculation (min) 40 min   Equipment Utilized During Treatment Gait belt   Activity Tolerance Patient tolerated treatment well   Behavior During Therapy [facility] for tasks assessed/performed      Past Medical History  Diagnosis Date  . Hypertension   . High cholesterol   . Stroke ([place])   . Carotid stent occlusion [facility])     Past Surgical History  Procedure Laterality Date  . Cataract extraction    . Gallbladder surgery      There were no vitals filed for this visit.      Subjective Assessment - 01/09/16 0947    Subjective pt states no changes since last treatment session.   Pertinent History CVA 2011   Limitations Walking   Patient Stated Goals try to get walking as best I can   Currently in Pain? No/denies       Gait training: Nustep L4, LE only x 5 min (unbilled) Gait training in grass: Agility ladder, bil side stepping x 3 laps, zig zag (fwd, lateral, retro) x 2 laps; CGA - min A throughout for balance, increased unsteadiness side stepping R; mod verbal cues to increase hip flexion and ankle DF bil side stepping with ball toss, 37f x 3 laps; CGA throughout, mod verbal cues for proper L foot placement ball kicks x 5 min with BLE, increased difficulty kicking with L retro walking 518f3 laps; mod verbal cues to increase L hip extension, knee flexion         PT Education - 01/09/16 0947    Education provided Yes   Education Details exercise, gait     Person(s) Educated Patient   Methods Explanation   Comprehension Verbalized understanding             PT Long Term Goals - 01/07/16 1307    PT LONG TERM GOAL #1   Title pt will increase FGA score by >5 points to demonstrate improved dynamic balance and decrease risk of falls.   Baseline 26 (7/10)   Time 4   Period Weeks   Status Partially Met   PT LONG TERM GOAL #2   Title pt will walk uneven ground for 50 ft without toe drag to perform yard work and decrease risk of falls.   Time 4   Period Weeks   Status Partially Met   PT LONG TERM GOAL #3   Title pt will be independent with HEP to further increase strength and ROM to improve overall function.   Time 4   Period Weeks   Status Achieved   PT LONG TERM GOAL #4   Title pt will increase LEFS score by > 9 points to demonstrate improved overall function and decrease risk of falls.   Baseline 45   Time 4   Period Weeks   Status On-going               Plan -  01/09/16 1228    Clinical Impression Statement pt tolerated progressed gait training well today. He continues to demonstrate inconsistency with his gait as he needs verbal cues to increase heel strike and hip flexion throughout. pt had difficulty with side stepping in grass with ball toss and agility ladder exercises. pt c/o R hip pain during exercises, likely due to fatigue. pt needs continued skilled PT intervention to maximize overall function.   Rehab Potential Good   Clinical Impairments Affecting Rehab Potential chronicity of issue, spasticity L gastroc/soleus   PT Frequency 2x / week   PT Duration 4 weeks   PT Treatment/Interventions ADLs/Self Care Home Management;Electrical Stimulation;Gait training;Functional mobility training;Therapeutic exercise;Therapeutic activities;Balance training;Neuromuscular re-education;Patient/family education;Manual techniques;Passive range of motion   PT Next Visit Plan dynamic balance, gait training, stretching,  strengthening   Consulted and Agree with Plan of Care Patient      Patient will benefit from skilled therapeutic intervention in order to improve the following deficits and impairments:  Abnormal gait, Decreased balance, Decreased coordination  Visit Diagnosis: Other lack of coordination     Problem List Patient Active Problem List   Diagnosis Date Noted  . Occlusion and stenosis of carotid artery with cerebral infarction 07/27/2013   Geraldine Solar, SPT  Geraldine Solar 01/09/2016, 12:30 PM   This entire session was performed under direct supervision and direction of a licensed therapist/therapist assistant . I have personally read, edited and approve of the note as written.  Mindy Shiela Mayer, PT, DPT  01/09/2016, 1:19 PM [place] MAIN [facility] SERVICES [address] [place], [place], [address] Phone: [phone]   Fax:  [phone]  Name: Logon Uttech. MRN: [id number] Date of Birth: Dec 29, 1940

## 2016-01-17 ENCOUNTER — Ambulatory Visit: Payer: Medicare Other | Admitting: Physical Therapy

## 2016-01-17 DIAGNOSIS — R278 Other lack of coordination: Secondary | ICD-10-CM

## 2016-01-17 NOTE — Therapy (Signed)
[place] MAIN [facility] SERVICES [address] [place], [place], [address] Phone: [phone]   Fax:  [phone]  Physical Therapy Treatment  Patient Details  Name: Troy Pugh. MRN: [id number] Date of Birth: 06/09/1941 Referring Provider: Dr. Doy Hutching  Encounter Date: 01/17/2016      PT End of Session - 01/17/16 0846    Visit Number 9   Number of Visits 25   Date for PT Re-Evaluation 02/04/16   Authorization Type 3/10   PT Start Time 0845   PT Stop Time 0929   PT Time Calculation (min) 44 min   Equipment Utilized During Treatment Gait belt   Activity Tolerance Patient tolerated treatment well   Behavior During Therapy [facility] for tasks assessed/performed      Past Medical History  Diagnosis Date  . Hypertension   . High cholesterol   . Stroke ([place])   . Carotid stent occlusion [facility])     Past Surgical History  Procedure Laterality Date  . Cataract extraction    . Gallbladder surgery      There were no vitals filed for this visit.      Subjective Assessment - 01/17/16 0845    Subjective pt states he is doing well today. Reports his walking and balance are improving.   Pertinent History CVA 2011   Limitations Walking   Patient Stated Goals try to get walking as best I can   Currently in Pain? No/denies         Therex: Nustep L5, LE only x 5 min (unbilled) Single leg sit <> stand from elevated surface height and with UE support, 1x5; min A for balance, min A to achieve full upright position Sit <> stand with RLE elevated on 6" step with no UE support 2x10; CGA for balance  NMR: Bil SLS on airex with fast low rows with RTB, 3x10 each leg; CGA - min A for balance, min verbal cues for proper technique; increased difficulty with LLE Bil stance with front foot on dynadisc and rear foot on airex pad with chops/lifts, fwd/retro circles, bil rotations, rainbow, with 5# weight bar, 1x10 each direction; CGA- min A for balance;  increased posterior weight shift throughout, min verbal cues for proper technique  Gait training: Retro walking in hallway with and without GTB, 170fx4 each; CGA with GTB, min -mod verbal cues for increase L hip extension throughout          PT Education - 01/17/16 0846    Education provided Yes   Education Details gait, weight shifting   Person(s) Educated Patient   Methods Explanation   Comprehension Verbalized understanding             PT Long Term Goals - 01/07/16 1307    PT LONG TERM GOAL #1   Title pt will increase FGA score by >5 points to demonstrate improved dynamic balance and decrease risk of falls.   Baseline 26 (7/10)   Time 4   Period Weeks   Status Partially Met   PT LONG TERM GOAL #2   Title pt will walk uneven ground for 50 ft without toe drag to perform yard work and decrease risk of falls.   Time 4   Period Weeks   Status Partially Met   PT LONG TERM GOAL #3   Title pt will be independent with HEP to further increase strength and ROM to improve overall function.   Time 4   Period Weeks   Status  Achieved   PT LONG TERM GOAL #4   Title pt will increase LEFS score by > 9 points to demonstrate improved overall function and decrease risk of falls.   Baseline 45   Time 4   Period Weeks   Status On-going               Plan - 01/17/16 4128    Clinical Impression Statement pt continues to make progress and tolerated progressed balance activities today. pt continues to have most difficulty balancing on LLE as well as decreased hip extension with gait on LLE. pt also demonstrated significant weakness of L glute as he was unable to perform single sit to stand from elevated surface with UE support. pt needs continued skilled PT intervention to address remianing deficits to maximize overall function and independence.   Rehab Potential Good   Clinical Impairments Affecting Rehab Potential chronicity of issue, spasticity L gastroc/soleus   PT  Frequency 2x / week   PT Duration 4 weeks   PT Treatment/Interventions ADLs/Self Care Home Management;Electrical Stimulation;Gait training;Functional mobility training;Therapeutic exercise;Therapeutic activities;Balance training;Neuromuscular re-education;Patient/family education;Manual techniques;Passive range of motion   PT Next Visit Plan dynamic balance, gait training, stretching, strengthening   Consulted and Agree with Plan of Care Patient      Patient will benefit from skilled therapeutic intervention in order to improve the following deficits and impairments:  Abnormal gait, Decreased balance, Decreased coordination  Visit Diagnosis: Other lack of coordination     Problem List Patient Active Problem List   Diagnosis Date Noted  . Occlusion and stenosis of carotid artery with cerebral infarction 07/27/2013   Geraldine Solar, SPT  Geraldine Solar 01/17/2016, 9:46 AM   This entire session was performed under direct supervision and direction of a licensed therapist/therapist assistant . I have personally read, edited and approve of the note as written.  Mindy Shiela Mayer, PT, DPT  01/17/2016, 10:06 AM [phone]   [place] MAIN [facility] SERVICES [address] [place], [place], [address] Phone: [phone]   Fax:  [phone]  Name: Troy Pugh. MRN: [id number] Date of Birth: 08/14/1940

## 2016-01-21 ENCOUNTER — Ambulatory Visit: Payer: Medicare Other | Admitting: Physical Therapy

## 2016-01-21 DIAGNOSIS — R278 Other lack of coordination: Secondary | ICD-10-CM

## 2016-01-21 NOTE — Therapy (Addendum)
[place] MAIN [facility] SERVICES [address] [place], [place], [address] Phone: [phone]   Fax:  [phone]  Physical Therapy Treatment  Patient Details  Name: Troy Pugh. MRN: [id number] Date of Birth: 28-Apr-1941 Referring Provider: Dr. Doy Hutching  Encounter Date: 01/21/2016      PT End of Session - 01/21/16 1438    Visit Number 10   Number of Visits 25   Date for PT Re-Evaluation 02/04/16   Authorization Type 4/10   PT Start Time 1430   PT Stop Time 1513   PT Time Calculation (min) 43 min   Equipment Utilized During Treatment Gait belt   Activity Tolerance Patient tolerated treatment well   Behavior During Therapy WFL for tasks assessed/performed      Past Medical History:  Diagnosis Date  . Carotid stent occlusion ([place])   . High cholesterol   . Hypertension   . Stroke [facility])     Past Surgical History:  Procedure Laterality Date  . CATARACT EXTRACTION    . GALLBLADDER SURGERY      There were no vitals filed for this visit.      Subjective Assessment - 01/21/16 1437    Subjective pt reports no new changes over the weekend. States he is doing well today.   Pertinent History CVA 2011   Limitations Walking   Patient Stated Goals try to get walking as best I can   Currently in Pain? No/denies      Therex: Nustep L5 LE only x 5 min (unbilled) Resisted walking 4-way x 15# x 5 laps each; pt unsteady throughout requiring CGA- min A for balance; mod verbal cues to increase L toe clearance and step length/width throughout; retro most difficult for pt with toe clearance  NMR: Bil SLS on airex with fast low rows with RTB, 3x20 each leg; CGA - min A for balance, min verbal cues for proper technique; increased difficulty with LLE, increased posterior LOB Bil tandem stance on airex with EC, 10x10 sec each; pt demonstrated increased posterolateral LOB bil, LLE back > RLE back Bil SLS on airex with chest press, OH elevation,  bil chops/lifts 1x10 each leg;increased posterior LOB, cues for proper weight shifting           PT Education - 01/21/16 1437    Education provided Yes   Education Details exercise, weight shifting   Person(s) Educated Patient   Methods Explanation   Comprehension Verbalized understanding             PT Long Term Goals - 01/07/16 1307      PT LONG TERM GOAL #1   Title pt will increase FGA score by >5 points to demonstrate improved dynamic balance and decrease risk of falls.   Baseline 26 (7/10)   Time 4   Period Weeks   Status Partially Met     PT LONG TERM GOAL #2   Title pt will walk uneven ground for 50 ft without toe drag to perform yard work and decrease risk of falls.   Time 4   Period Weeks   Status Partially Met     PT LONG TERM GOAL #3   Title pt will be independent with HEP to further increase strength and ROM to improve overall function.   Time 4   Period Weeks   Status Achieved     PT LONG TERM GOAL #4   Title pt will increase LEFS score by > 9 points to demonstrate  improved overall function and decrease risk of falls.   Baseline 45   Time 4   Period Weeks   Status On-going               Plan - 01/21/16 1514    Clinical Impression Statement Pt continues to make progress towards goals as evidenced by ability to tolerate progressed balance and strengthening exercises. Pt more off balance this session with resisted walking and needed cues to increase LLE toe clearance throughout. Pt dynamic balance still has deficits as he demonstrates posterolateral LOB throughout. Pt needs continued skilled PT intervention to address remaining deficits to maximize independence and overall function.   Rehab Potential Good   Clinical Impairments Affecting Rehab Potential chronicity of issue, spasticity L gastroc/soleus   PT Frequency 2x / week   PT Duration 4 weeks   PT Treatment/Interventions ADLs/Self Care Home Management;Electrical Stimulation;Gait  training;Functional mobility training;Therapeutic exercise;Therapeutic activities;Balance training;Neuromuscular re-education;Patient/family education;Manual techniques;Passive range of motion   PT Next Visit Plan dynamic balance, gait training, stretching, strengthening   Consulted and Agree with Plan of Care Patient      Patient will benefit from skilled therapeutic intervention in order to improve the following deficits and impairments:  Abnormal gait, Decreased balance, Decreased coordination  Visit Diagnosis: Other lack of coordination     Problem List Patient Active Problem List   Diagnosis Date Noted  . Occlusion and stenosis of carotid artery with cerebral infarction 07/27/2013   Geraldine Solar, SPT This entire session was performed under direct supervision and direction of a licensed therapist/therapist assistant . I have personally read, edited and approve of the note as written.  Brice,Carrie PT, DPT 01/21/2016, 4:32 PM  [place] MAIN [facility] SERVICES [address] [place], [place], [address] Phone: [phone]   Fax:  [phone]  Name: Troy Pugh. MRN: [id number] Date of Birth: 1940/08/25

## 2016-01-30 ENCOUNTER — Ambulatory Visit: Payer: Medicare Other | Attending: Internal Medicine

## 2016-01-30 DIAGNOSIS — R278 Other lack of coordination: Secondary | ICD-10-CM | POA: Diagnosis not present

## 2016-01-30 NOTE — Therapy (Signed)
[place] MAIN [facility] SERVICES [address] [place], [place], [address] Phone: [phone]   Fax:  [phone]  Physical Therapy Treatment  Patient Details  Name: Troy Pugh. MRN: [id number] Date of Birth: [birth year] Referring Provider: Dr. Doy Hutching  Encounter Date: 01/30/2016      PT End of Session - 01/30/16 1810    Visit Number 11   Number of Visits 25   Date for PT Re-Evaluation 02/04/16   Authorization Type 5/10   PT Start Time 1648   PT Stop Time 1730   PT Time Calculation (min) 42 min   Equipment Utilized During Treatment Gait belt   Activity Tolerance Patient tolerated treatment well   Behavior During Therapy WFL for tasks assessed/performed      Past Medical History:  Diagnosis Date  . Carotid stent occlusion ([place])   . High cholesterol   . Hypertension   . Stroke [facility])     Past Surgical History:  Procedure Laterality Date  . CATARACT EXTRACTION    . GALLBLADDER SURGERY      There were no vitals filed for this visit.      Subjective Assessment - 01/30/16 1808    Subjective Pt states he is tired this date because he performed a lot of yardwork prior to therapy.   Pertinent History CVA 2011   Limitations Walking   Patient Stated Goals try to get walking as best I can   Currently in Pain? No/denies      NMR: L single leg on firm surface with OH elevation of small yellow medicine ball; CGA - min A throughout for unsteadiness Bil SLS on airex with fast low rows with RTB, x 20 each; min A for balance when on LLE Bil SLS on airex, 5x15 sec holds each; increased difficulty with LLE Bil tandem stance on airex 5x30 sec holds each; CGA - min A for balance  Gait training: Bil side stepping on red mat with agility ladder x 3 laps; increased difficulty when going L, but demonstrated increased toe drag when going R; mod cues to increase hip flexion Fwd walking over red mat with obstacles underneath x 5 laps; min cues  to increase LLE clearance Stepping stones under red mat and fwd walking over uneven surface Retro walking in hallway, 139fx2; mod verbal cues to increased hip extension and knee flexion Walking in hallway 1517f2; min verbal cues for increase toe clearance Side stepping with GTB, 159f 5 laps       PT Education - 01/30/16 1810    Education provided Yes   Education Details decrease toe drag, increase hip flexion, gait   Person(s) Educated Patient   Methods Explanation   Comprehension Verbalized understanding             PT Long Term Goals - 01/07/16 1307      PT LONG TERM GOAL #1   Title pt will increase FGA score by >5 points to demonstrate improved dynamic balance and decrease risk of falls.   Baseline 26 (7/10)   Time 4   Period Weeks   Status Partially Met     PT LONG TERM GOAL #2   Title pt will walk uneven ground for 50 ft without toe drag to perform yard work and decrease risk of falls.   Time 4   Period Weeks   Status Partially Met     PT LONG TERM GOAL #3   Title pt will be independent with HEP  to further increase strength and ROM to improve overall function.   Time 4   Period Weeks   Status Achieved     PT LONG TERM GOAL #4   Title pt will increase LEFS score by > 9 points to demonstrate improved overall function and decrease risk of falls.   Baseline 45   Time 4   Period Weeks   Status On-going               Plan - 01/30/16 1811    Clinical Impression Statement Pt making progress towards goals. He was given updated HEP to include balance and LE strengthening exercises. Pt continues to require cues for increased toe clearance during ambulation.    Rehab Potential Good   Clinical Impairments Affecting Rehab Potential chronicity of issue, spasticity L gastroc/soleus   PT Frequency 2x / week   PT Duration 4 weeks   PT Treatment/Interventions ADLs/Self Care Home Management;Electrical Stimulation;Gait training;Functional mobility  training;Therapeutic exercise;Therapeutic activities;Balance training;Neuromuscular re-education;Patient/family education;Manual techniques;Passive range of motion   PT Next Visit Plan dynamic balance, gait training, stretching, strengthening   Consulted and Agree with Plan of Care Patient      Patient will benefit from skilled therapeutic intervention in order to improve the following deficits and impairments:  Abnormal gait, Decreased balance, Decreased coordination  Visit Diagnosis: Other lack of coordination     Problem List Patient Active Problem List   Diagnosis Date Noted  . Occlusion and stenosis of carotid artery with cerebral infarction 07/27/2013   Troy Pugh, SPT This entire session was performed under direct supervision and direction of a licensed therapist/therapist assistant . I have personally read, edited and approve of the note as written. Gorden Harms. Tortorici, PT, DPT [id number]  Tortorici,Ashley 01/31/2016, 5:29 PM  [place] MAIN [facility] SERVICES [address] [place], [place], [address] Phone: [phone]   Fax:  [phone]  Name: Troy Pugh. MRN: [id number] Date of Birth: [birth year]

## 2016-01-30 NOTE — Patient Instructions (Signed)
[email or web address]  bil SLS and tandem stance on pillow bil side stepping with GTB  3x10, 2x/day each

## 2016-02-07 ENCOUNTER — Ambulatory Visit: Payer: Medicare Other

## 2016-02-07 DIAGNOSIS — R278 Other lack of coordination: Secondary | ICD-10-CM

## 2016-02-07 NOTE — Therapy (Signed)
[place] [facility] SERVICES [address] [place], [place], [address] Phone: [phone]   Fax:  [phone]  Physical Therapy Treatment/Discharge Summary  Patient Details  Name: Troy Pugh. MRN: [id number] Date of Birth: 05/30/1941 Referring Provider: Dr. Doy Hutching  Encounter Date: 02/07/2016      PT End of Session - 02/07/16 1438    Visit Number 12   Number of Visits 25   Date for PT Re-Evaluation 02/04/16   Authorization Type 6/10   PT Start Time 1348   PT Stop Time 1433   PT Time Calculation (min) 45 min   Equipment Utilized During Treatment Gait belt   Activity Tolerance Patient tolerated treatment well   Behavior During Therapy WFL for tasks assessed/performed      Past Medical History:  Diagnosis Date  . Carotid stent occlusion ([place])   . High cholesterol   . Hypertension   . Stroke [facility])     Past Surgical History:  Procedure Laterality Date  . CATARACT EXTRACTION    . GALLBLADDER SURGERY      There were no vitals filed for this visit.      Subjective Assessment - 02/07/16 1450    Subjective Pt reports he wsa compliant with his HEP and states that he has been working hard on proper gait when walking his dog.   Pertinent History CVA 2011   Limitations Walking   Patient Stated Goals try to get walking as best I can   Currently in Pain? No/denies        SPT reassessed outcome measures as followed:     [facility] PT Assessment - 02/07/16 0001      Functional Gait  Assessment   Gait assessed  Yes   Gait Level Surface Walks 20 ft in less than 5.5 sec, no assistive devices, good speed, no evidence for imbalance, normal gait pattern, deviates no more than 6 in outside of the 12 in walkway width.   Change in Gait Speed Able to smoothly change walking speed without loss of balance or gait deviation. Deviate no more than 6 in outside of the 12 in walkway width.   Gait with Horizontal Head Turns Performs head turns  smoothly with no change in gait. Deviates no more than 6 in outside 12 in walkway width   Gait with Vertical Head Turns Performs head turns with no change in gait. Deviates no more than 6 in outside 12 in walkway width.   Gait and Pivot Turn Pivot turns safely within 3 sec and stops quickly with no loss of balance.   Step Over Obstacle Is able to step over 2 stacked shoe boxes taped together (9 in total height) without changing gait speed. No evidence of imbalance.   Gait with Narrow Base of Support Is able to ambulate for 10 steps heel to toe with no staggering.   Gait with Eyes Closed Walks 20 ft, uses assistive device, slower speed, mild gait deviations, deviates 6-10 in outside 12 in walkway width. Ambulates 20 ft in less than 9 sec but greater than 7 sec.   Ambulating Backwards Walks 20 ft, no assistive devices, good speed, no evidence for imbalance, normal gait   Steps Alternating feet, no rail.   Total Score 29     Gait training: TM w/u x 1.44mh at 1% grade x 3 min (unbilled) Fwd gait in grass 548fx 2; SBA Bil side stepping in grass with agility ladder and RTB around BLE x  3 laps; CGA; min verbal cues for increased hip flexion Kicking ball in grass with BLE x 8 mins; SBA-CGA for balance  Therex: Outcome measures: FGA: 29/30, not at risk for falls LEFS: 72/80, low perceived disability Walk outside for 66f without toe drag: met       PT Education - [id number]    Education provided Yes   Education Details discharge HEP   Person(s) Educated Patient   Methods Explanation   Comprehension Verbalized understanding             PT Long Term Goals - [id number]      PT LONG TERM GOAL #1   Title pt will increase FGA score by >5 points to demonstrate improved dynamic balance and decrease risk of falls.   Baseline 29/30   Time 4   Period Weeks   Status Achieved     PT LONG TERM GOAL #2   Title pt will walk uneven ground for 50 ft without toe drag to perform yard work  and decrease risk of falls.   Time 4   Period Weeks   Status Achieved     PT LONG TERM GOAL #3   Title pt will be independent with HEP to further increase strength and ROM to improve overall function.   Time 4   Period Weeks   Status Achieved     PT LONG TERM GOAL #4   Title pt will increase LEFS score by > 9 points to demonstrate improved overall function and decrease risk of falls.   Baseline 72   Time 4   Period Weeks   Status Achieved               Plan - 008/18/171452    Clinical Impression Statement SPT reassessed pt's goals this date and pt has met all of them. His gait and dynamic balance have improved significantly since beginning therapy and he is able to correct his gait with no verbal cues. Due to progress, pt will be discharged to his advanced HEP. Pt's self reported amount of disability has improved greatly as demonstrated by his 72/80 on LEFS (lower the score, higher the perceived disability). He and his wife were educated that if he has a decline in function in the future then he can return to PT with referral.   Rehab Potential Good   Clinical Impairments Affecting Rehab Potential chronicity of issue, spasticity L gastroc/soleus   PT Frequency 2x / week   PT Duration 4 weeks   PT Treatment/Interventions ADLs/Self Care Home Management;Electrical Stimulation;Gait training;Functional mobility training;Therapeutic exercise;Therapeutic activities;Balance training;Neuromuscular re-education;Patient/family education;Manual techniques;Passive range of motion   PT Next Visit Plan dynamic balance, gait training, stretching, strengthening   Consulted and Agree with Plan of Care Patient      Patient will benefit from skilled therapeutic intervention in order to improve the following deficits and impairments:  Abnormal gait, Decreased balance, Decreased coordination  Visit Diagnosis: Other lack of coordination - Plan: PT plan of care cert/re-cert       G-Codes -  [id number]-[id number]    Functional Assessment Tool Used FGA   Functional Limitation Mobility: Walking and moving around   Mobility: Walking and Moving Around Current Status ((I0973 At least 1 percent but less than 20 percent impaired, limited or restricted   Mobility: Walking and Moving Around Goal Status ((Z3299 At least 1 percent but less than 20 percent impaired, limited or restricted   Mobility: Walking and Moving Around Discharge Status [phone]  At least 1 percent but less than 20 percent impaired, limited or restricted      Problem List Patient Active Problem List   Diagnosis Date Noted  . Occlusion and stenosis of carotid artery with cerebral infarction 07/27/2013   Geraldine Solar, SPT This entire session was performed under direct supervision and direction of a licensed therapist/therapist assistant . I have personally read, edited and approve of the note as written. Gorden Harms. Tortorici, PT, DPT [phone]  Tortorici,Ashley 02/07/2016, 5:01 PM  [place] [facility]IN [facility] SERVICES [address] [place], [place], [address] Phone: [phone]   Fax:  [phone]  Name: Troy Pugh. MRN: [id number] Date of Birth: 03/19/1941

## 2016-02-07 NOTE — Patient Instructions (Signed)
[email or web address] Standing marching with GTB Standing hip abd with GTB Monster walks with GTB 3x10, 2x/day

## 2016-06-09 DIAGNOSIS — I350 Nonrheumatic aortic (valve) stenosis: Secondary | ICD-10-CM | POA: Insufficient documentation

## 2016-08-27 ENCOUNTER — Ambulatory Visit (INDEPENDENT_AMBULATORY_CARE_PROVIDER_SITE_OTHER): Payer: Medicare Other | Admitting: Neurology

## 2016-08-27 ENCOUNTER — Encounter: Payer: Self-pay | Admitting: Neurology

## 2016-08-27 VITALS — BP 125/80 | HR 63 | Wt 187.0 lb

## 2016-08-27 DIAGNOSIS — I69354 Hemiplegia and hemiparesis following cerebral infarction affecting left non-dominant side: Secondary | ICD-10-CM | POA: Diagnosis not present

## 2016-08-27 NOTE — Progress Notes (Signed)
[facility] [address] [place]. [place] [address] [phone]       OFFICE FOLLOW-UP NOTE  [name]. Date of Birth:  June 20, 1941 Medical Record Number:  [id number]   HPI: 76 year old Caucasian male with remote history of right hemispheric infarct in March 2011 terminal right ICA occlusion and proximal right ICA stenosis treated with IV plus IA TPA and mechanical thrombectomy with Merci device with full recanalization and also needing rescue right ICA angioplasty/stenting for proximal right ICA stenosis which appeared occluded post procedure fortunately without clinical worsening. Risk factors of hypertension and hyperlipidemia. Patient his participating in the IRIS stroke trial. Update 07/27/13 : He returns for followup after last visit on 07/13/12. He continues to do well without any recurrent stroke or TIA symptoms. His blood pressure has been running low and his cardiologist discontinued her report has an S. he was having orthostatic symptoms. He had lipid profile check [place] 03/25/13 which showed total cholesterol 149, triglycerides 120, LDL 87 mg percent. Transcranial Doppler study dated 11/16/12 showed retrograde flow from the right ophthalmic artery and limited mean flow velocities in left A1 and right C4 indicating of proximal right ICA occlusion with collateral flow. Carotid Dopplers showed and related right ECA velocity is consistent with moderate stenosis of the vessel. There were no significant changes compared with previous carotid Doppler study. He still continues to work part-time doing axis. He is still quite active and walks daily. He is still participating in the IRIS  study in scheduled to come for the study termination visit in May. Update 08/30/2014 ; he returns for follow-up after last visit a year ago. He continues to do well from stroke standpoint and has not had any further strokes now for more than 4 years. He recently received a letter from the [place]  study after the results were published and found out that he was on the active medicine but he had stopped it in June 2013 due to prostate cancer. He has noticed some new ringing sound in his ear but has not discussed this yet with his primary physician. He has had issues with anxiety and intermittent speech difficulties. He has tried Celexa which didn't work and is currently on Wellbutrin 150 mg which also does not seem to be helping. I advised him to see his primary physician to further adjust his medications. He has no stroke related complaints. His tolerating Plavix well without bleeding, bruising. He states his blood pressure is under good control and last lipid profile was fine. 08/31/2015 : He returns for follow-up after last visit a year ago. He continues to do well without recurrent stroke or TIA symptoms. He continues to have minimum diminished fine motor skills and some stiffness and heaviness in his left leg particularly when he is tired or tries to walk quickly. His at no falls. He states blood pressure control was good and today it is 115/67. His cholesterol was changed to Lipitor because of cost concerns and TriCor was recently discontinued by his primary physician. Last lipid profile checked in November 2016 was fine. He had a carotid ultrasound done on 11/16/13 which showed chronic right ICA occlusion with some attempt at collateralization. No significant left-sided stenosis. He remains on Plavix which is tolerating well without bruising bleeding or other side effects. He has no new complaints today. Update 08/27/2016 : He returns for f/u after last visit 1 year ago. He continues to do well without recurrent stroke or TIA symptoms now since more  than 6 years ago. He does complain of mild short-term memory difficulties and being forgetful and not remembering recent information however this is not progressive. He did have follow-up carotid ultrasound and ask them Doppler studies in May of last year and  this showed no significant changes. He remains on Plavix which is tolerating well without bleeding or bruising. States his blood pressure well-controlled today it is 125/80. His tolerate Lipitor well without muscle aches and pains. On cognitive testing today in the office his recall was 3/3 and he was able to name 10 animals with full legs. He continues to have mild left leg dragging particularly when is tired but is fully independent and handles all his activities of daily living. ROS:   14 system review of systems is positive for sleep talking, restless legs, gait difficulty  ,   and all other systems negative  PMH:  Past Medical History:  Diagnosis Date  . Carotid stent occlusion ([place])   . High cholesterol   . Hypertension   . Stroke [facility])     Social History:  Social History   Social History  . Marital status: Married    Spouse name: chris  . Number of children: 0  . Years of education: college   Occupational History  . Not on file.   Social History Main Topics  . Smoking status: Never Smoker  . Smokeless tobacco: Never Used  . Alcohol use 1.8 oz/week    1 Glasses of wine, 1 Cans of beer, 1 Shots of liquor per week     Comment: occasionally  . Drug use: No  . Sexual activity: Not on file   Other Topics Concern  . Not on file   Social History Narrative   Patient is married, no children.   Patient is-- handed.   Patient has college education.   Patient drinks -cups daily.    Medications:   Current Outpatient Prescriptions on File Prior to Visit  Medication Sig Dispense Refill  . allopurinol (ZYLOPRIM) 100 MG tablet Take 100 mg by mouth daily.     Marland Kitchen atorvastatin (LIPITOR) 10 MG tablet     . buPROPion (WELLBUTRIN XL) 150 MG 24 hr tablet Take 150 mg by mouth daily.    . Calcium Carb-Cholecalciferol ([employer] CALCIUM + D3) 600-500 MG-UNIT CAPS Take 1 tablet by mouth daily.     . Cholecalciferol (VITAMIN D) 2000 UNITS tablet Take 2,000 Units by mouth daily.    . clopidogrel  (PLAVIX) 75 MG tablet Take 75 mg by mouth daily.     . colchicine 0.6 MG tablet Take 0.6 mg by mouth as needed.    . folic acid (FOLVITE) A999333 MCG tablet Take 400 mcg by mouth daily.    . pindolol (VISKEN) 5 MG tablet Take 5 mg by mouth daily.    . tamsulosin (FLOMAX) 0.4 MG CAPS capsule Take 0.4 mg by mouth daily.     No current facility-administered medications on file prior to visit.     Allergies:   Allergies  Allergen Reactions  . Other Hives  . Sulphadimidine [Sulfamethazine]     Physical Exam General: well developed, well nourished Caucasian male, seated, in no evident distress Head: head normocephalic and atraumatic.   Neck: supple with no carotid or supraclavicular bruits Cardiovascular: regular rate and rhythm, no murmurs Musculoskeletal: no deformity Skin:  no rash/petichiae Vascular:  Normal pulses all extremities Vitals:   08/27/16 1153  BP: 125/80  Pulse: 63   Neurologic Exam Mental Status:  Awake and fully alert. Oriented to place and time. Recent and remote memory intact. Attention span, concentration and fund of knowledge appropriate. Mood and affect appropriate.  Cranial Nerves: Fundoscopic exam not done . Pupils equal, briskly reactive to light. Extraocular movements full without nystagmus. Visual fields full to confrontation. Hearing intact. Voice is slightly hypophonic. Facial sensation intact. Face, tongue, palate moves normally and symmetrically.  Motor: Normal bulk and tone. Normal strength in all tested extremity muscles. Diminished fine finger movements on the left. Orbits right over left upper extremity.Increased tone on the left side with mild spasticity Sensory.: intact to touch and pinprick and vibratory sensation.  Coordination: Rapid alternating movements normal in all extremities. Finger-to-nose and heel-to-shin performed accurately bilaterally. Gait and Station: Arises from chair without difficulty. Stance is normal. Gait demonstrates normal stride  length and balance but slight stiffness and dragging of left leg . Able to heel, toe and tandem walk without difficulty.  Reflexes: 1+ and symmetric. Toes downgoing.      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 Risk factors of hypertension and hyperlipidemia. Patient completed participating in the IRIS stroke trial.     PLAN:   I had a long d/w patient and his wife about his remote stroke,spastic left hemiparesis, risk for recurrent stroke/TIAs, personally independently reviewed imaging studies and stroke evaluation results and answered questions.Continue Plavix for secondary stroke prevention and maintain strict control of hypertension with blood pressure goal below 130/90, diabetes with hemoglobin A1c goal below 6.5% and lipids with LDL cholesterol goal below 70 mg/dL. I also advised the patient to eat a healthy diet with plenty of whole grains, cereals, fruits and vegetables, exercise regularly and maintain ideal body weight . Greater than 50% time during this 25 minute visit was spent on counseling about stroke risk, prevention and discussion of treatment and answering questions No routine scheduled follow-up visit as necessary but patient can call or be referred back in the future as necessary.Antony Contras, MD Note: This document was prepared with digital dictation and possible smart phrase technology. Any transcriptional errors that result from this process are unintentional

## 2016-08-27 NOTE — Patient Instructions (Signed)
I had a long d/w patient and his wife about his remote stroke,spastic left hemiparesis, risk for recurrent stroke/TIAs, personally independently reviewed imaging studies and stroke evaluation results and answered questions.Continue Plavix for secondary stroke prevention and maintain strict control of hypertension with blood pressure goal below 130/90, diabetes with hemoglobin A1c goal below 6.5% and lipids with LDL cholesterol goal below 70 mg/dL. I also advised the patient to eat a healthy diet with plenty of whole grains, cereals, fruits and vegetables, exercise regularly and maintain ideal body weight . No routine scheduled follow-up visit as necessary but patient can call or be referred back in the future as necessary.

## 2016-08-29 ENCOUNTER — Ambulatory Visit: Payer: Medicare Other | Admitting: Neurology

## 2016-10-01 ENCOUNTER — Emergency Department
Admission: EM | Admit: 2016-10-01 | Discharge: 2016-10-01 | Disposition: A | Discharge status: Home / Self Care | Payer: Medicare Other | Attending: Emergency Medicine | Admitting: Emergency Medicine

## 2016-10-01 ENCOUNTER — Encounter: Payer: Self-pay | Admitting: *Deleted

## 2016-10-01 DIAGNOSIS — R55 Syncope and collapse: Secondary | ICD-10-CM | POA: Insufficient documentation

## 2016-10-01 DIAGNOSIS — I10 Essential (primary) hypertension: Secondary | ICD-10-CM | POA: Insufficient documentation

## 2016-10-01 DIAGNOSIS — Z79899 Other long term (current) drug therapy: Secondary | ICD-10-CM | POA: Insufficient documentation

## 2016-10-01 LAB — BASIC METABOLIC PANEL
Anion gap: 8 (ref 5–15)
BUN: 20 mg/dL (ref 6–20)
CALCIUM: 8.8 mg/dL — AB (ref 8.9–10.3)
CO2: 24 mmol/L (ref 22–32)
Chloride: 106 mmol/L (ref 101–111)
Creatinine, Ser: 1.44 mg/dL — ABNORMAL HIGH (ref 0.61–1.24)
GFR calc Af Amer: 54 mL/min — ABNORMAL LOW (ref >60)
GFR, EST NON AFRICAN AMERICAN: 46 mL/min — AB (ref >60)
GLUCOSE: 101 mg/dL — AB (ref 65–99)
POTASSIUM: 3.9 mmol/L (ref 3.5–5.1)
Sodium: 138 mmol/L (ref 135–145)

## 2016-10-01 LAB — CBC WITH DIFFERENTIAL/PLATELET
Basophils Absolute: 0.1 10*3/uL (ref 0–0.1)
Basophils Relative: 1 %
EOS PCT: 3 %
Eosinophils Absolute: 0.2 10*3/uL (ref 0–0.7)
HCT: 40.3 % (ref 40.0–52.0)
Hemoglobin: 13.8 g/dL (ref 13.0–18.0)
LYMPHS ABS: 1.2 10*3/uL (ref 1.0–3.6)
LYMPHS PCT: 18 %
MCH: 31.3 pg (ref 26.0–34.0)
MCHC: 34.4 g/dL (ref 32.0–36.0)
MCV: 91.1 fL (ref 80.0–100.0)
MONO ABS: 0.7 10*3/uL (ref 0.2–1.0)
MONOS PCT: 11 %
Neutro Abs: 4.7 10*3/uL (ref 1.4–6.5)
Neutrophils Relative %: 67 %
PLATELETS: 192 10*3/uL (ref 150–440)
RBC: 4.42 MIL/uL (ref 4.40–5.90)
RDW: 13.2 % (ref 11.5–14.5)
WBC: 6.9 10*3/uL (ref 3.8–10.6)

## 2016-10-01 LAB — TROPONIN I: Troponin I: <0.03 ng/mL (ref <0.03)

## 2016-10-01 LAB — GLUCOSE, CAPILLARY: Glucose-Capillary: 94 mg/dL (ref 65–99)

## 2016-10-01 MED ORDER — SODIUM CHLORIDE 0.9 % IV BOLUS (SEPSIS)
1000.0000 mL | Freq: Once | INTRAVENOUS | Status: AC
  Administered 2016-10-01: 1000 mL via INTRAVENOUS

## 2016-10-01 NOTE — ED Provider Notes (Signed)
[facility] Emergency Department Provider Note  ____________________________________________   First MD Initiated Contact with Patient 10/01/16 2138     (approximate)  I have reviewed the triage vital signs and the nursing notes.   HISTORY  Chief Complaint Near Syncope   HPI Troy Esty. is a 76 y.o. male With a history of stroke and hypertension who is presenting after nursing will episode. He says that he was doing yard work all day today and then came in, had 2 scotches and then ate dinner. He says he was standing up and doing the dishes when he began to feel lightheaded. He became diaphoretic and lowered himself to a chair. His family says for about 56 minutes he was borderline unconscious but still able to have a conversation. The patient remembers everything but remembers being drowsy. He never fully loss consciousness. Denies any chest pain or shortness of breath. Says that he feels back to his baseline at this time. He had chili beans as   Well as corn bread for dinner and says that he did not have a disproportionately large portion.Initially the patient was in the 70W, systolic. However, on arrival he is 237 systolic.   Past Medical History:  Diagnosis Date  . Carotid stent occlusion ([place])   . High cholesterol   . Hypertension   . Stroke [facility])     Patient Active Problem List   Diagnosis Date Noted  . Occlusion and stenosis of carotid artery with cerebral infarction 07/27/2013    Past Surgical History:  Procedure Laterality Date  . CATARACT EXTRACTION    . GALLBLADDER SURGERY      Prior to Admission medications   Medication Sig Start Date End Date Taking? Authorizing Provider  allopurinol (ZYLOPRIM) 100 MG tablet Take 100 mg by mouth daily.  07/18/13   Historical Provider, MD  atorvastatin (LIPITOR) 10 MG tablet  06/27/15   Historical Provider, MD  buPROPion (WELLBUTRIN XL) 150 MG 24 hr tablet Take 150 mg by mouth daily. 08/30/14    Historical Provider, MD  Calcium Carb-Cholecalciferol ([employer] CALCIUM + D3) 600-500 MG-UNIT CAPS Take 1 tablet by mouth daily.     Historical Provider, MD  Cholecalciferol (VITAMIN D) 2000 UNITS tablet Take 2,000 Units by mouth daily.    Historical Provider, MD  clopidogrel (PLAVIX) 75 MG tablet Take 75 mg by mouth daily.  07/01/13   Historical Provider, MD  colchicine 0.6 MG tablet Take 0.6 mg by mouth as needed.    Historical Provider, MD  folic acid (FOLVITE) 628 MCG tablet Take 400 mcg by mouth daily.    Historical Provider, MD  pindolol (VISKEN) 5 MG tablet Take 5 mg by mouth daily.    Historical Provider, MD  tamsulosin (FLOMAX) 0.4 MG CAPS capsule Take 0.4 mg by mouth daily.    Historical Provider, MD    Allergies Other and Sulphadimidine [sulfamethazine]  Family History  Problem Relation Age of Onset  . Kidney failure Mother   . Stroke Father     Social History Social History  Substance Use Topics  . Smoking status: Never Smoker  . Smokeless tobacco: Never Used  . Alcohol use 1.8 oz/week    1 Glasses of wine, 1 Cans of beer, 1 Shots of liquor per week     Comment: occasionally    Review of Systems Constitutional: No fever/chills Eyes: No visual changes. ENT: No sore throat. Cardiovascular: Denies chest pain. Respiratory: Denies shortness of breath. Gastrointestinal: No abdominal pain.  No nausea, no vomiting.  No diarrhea.  No constipation. Genitourinary: Negative for dysuria. Musculoskeletal: Negative for back pain. Skin: Negative for rash. Neurological: Negative for headaches, focal weakness or numbness.  10-point ROS otherwise negative.  ____________________________________________   PHYSICAL EXAM:  VITAL SIGNS: ED Triage Vitals  Enc Vitals Group     BP 10/01/16 2127 109/69     Pulse Rate 10/01/16 2127 77     Resp 10/01/16 2127 18     Temp 10/01/16 2127 97.5 F (36.4 C)     Temp Source 10/01/16 2127 Oral     SpO2 --      Weight 10/01/16 2130 187 lb  (84.8 kg)     Height 10/01/16 2130 5\' 9"  (1.753 m)     Head Circumference --      Peak Flow --      Pain Score --      Pain Loc --      Pain Edu? --      Excl. in [place]? --     Constitutional: Alert and oriented. Well appearing and in no acute distress. Eyes: Conjunctivae are normal. PERRL. EOMI. Head: Atraumatic. Nose: No congestion/rhinnorhea. Mouth/Throat: Mucous membranes are moist.   Neck: No stridor.   Cardiovascular: Normal rate, regular rhythm. Grossly normal heart sounds.   Respiratory: Normal respiratory effort.  No retractions. Lungs CTAB. Gastrointestinal: Soft and nontender. No distention.  Musculoskeletal: No lower extremity tenderness nor edema.  No joint effusions. Neurologic:  Normal speech and language. No gross focal neurologic deficits are appreciated.  Skin:  Skin is warm, dry and intact. No rash noted. Psychiatric: Mood and affect are normal. Speech and behavior are normal.  ____________________________________________   LABS (all labs ordered are listed, but only abnormal results are displayed)  Labs Reviewed  BASIC METABOLIC PANEL - Abnormal; Notable for the following:       Result Value   Glucose, Bld 101 (*)    Creatinine, Ser 1.44 (*)    Calcium 8.8 (*)    GFR calc non Af Amer 46 (*)    GFR calc Af Amer 54 (*)    All other components within normal limits  GLUCOSE, CAPILLARY  CBC WITH DIFFERENTIAL/PLATELET  TROPONIN I   ____________________________________________  EKG  ED ECG REPORT I, Doran Stabler, the attending physician, personally viewed and interpreted this ECG.   Date: 10/01/2016  EKG Time: 2126  Rate: 78  Rhythm: normal sinus rhythm  Axis: normal  Intervals:left bundle branch block  ST&T Change: No ST segment elevation or depression. No abnormal T-wave inversion. Morphologies are consistent with a left bundle branch block which is also seen on previous EKG from  2011.  ____________________________________________  RADIOLOGY   ____________________________________________   PROCEDURES  Procedure(s) performed:   Procedures  Critical Care performed:   ____________________________________________   INITIAL IMPRESSION / ASSESSMENT AND PLAN / ED COURSE  Pertinent labs & imaging results that were available during my care of the patient were reviewed by me and considered in my medical decision making (see chart for details).  ----------------------------------------- 10:40 PM on 10/01/2016 -----------------------------------------  Patient with reassuring lab work. Slight renal insufficiency which I suspect will be helped with hydration. He is a symptomatic at this time and "feels great." He is unable to ambulate throughout the room without any lightheadedness, dizziness or near-syncope or diaphoresis. We discussed staying well-hydrated as well as reducing his liquor intake. He is understanding of this plan and willing to comply. Will be following up with  his primary care doctor.      ____________________________________________   FINAL CLINICAL IMPRESSION(S) / ED DIAGNOSES  Near-syncope.    NEW MEDICATIONS STARTED DURING THIS VISIT:  New Prescriptions   No medications on file     Note:  This document was prepared using Dragon voice recognition software and may include unintentional dictation errors.    Orbie Pyo, MD 10/01/16 [phone]

## 2016-10-01 NOTE — ED Triage Notes (Signed)
Per EMS report, patient mowed today and had 2 drinks of Scotch tonight and had a near-syncopal event when loading the dishwasher. Patient's wife states patient did not drink much water today. EMS report that patient was pale and diaphoretic when standing to do orthostatics. Patient had normal skin color and temperature upon arrival.

## 2016-10-17 DIAGNOSIS — Z8546 Personal history of malignant neoplasm of prostate: Secondary | ICD-10-CM | POA: Insufficient documentation

## 2016-12-02 ENCOUNTER — Other Ambulatory Visit: Payer: Self-pay | Admitting: Internal Medicine

## 2016-12-02 DIAGNOSIS — R55 Syncope and collapse: Secondary | ICD-10-CM

## 2016-12-18 ENCOUNTER — Ambulatory Visit
Admission: RE | Admit: 2016-12-18 | Discharge: 2016-12-18 | Disposition: A | Discharge status: Home / Self Care | Payer: Medicare Other | Source: Ambulatory Visit | Attending: Internal Medicine | Admitting: Internal Medicine

## 2016-12-18 DIAGNOSIS — R55 Syncope and collapse: Secondary | ICD-10-CM | POA: Insufficient documentation

## 2016-12-18 DIAGNOSIS — G9389 Other specified disorders of brain: Secondary | ICD-10-CM | POA: Diagnosis not present

## 2017-01-01 ENCOUNTER — Telehealth: Payer: Self-pay | Admitting: Neurology

## 2017-01-01 NOTE — Telephone Encounter (Signed)
Pt wife calling to inform that a Carodid ultrasound was done last Friday, she states same as What Dr Leonie Man did for the last few years ago. Pt wife wanting to know if Dr Leonie Man is wanting to evaluate the results from this last ultrasound versus those from years ago while pt was seeing him. please call pt wife.the internist of pt is wanting him to go to [facility] and vascular, wife wanting to have the opinion of Dr Leonie Man 1st.

## 2017-01-01 NOTE — Telephone Encounter (Signed)
Rn spoke with Troy Pugh pts wife on dpr. Pts wanted to know should they keep the vein vascular appt on 01/27/2017. Pt had a carotid ultrasound on 12/26/2016 at [facility] clinic in [place]. The results are in everywhere.This was order by pts PCP. Pts wife wanted to get Dr. Leonie Man opinion and have him compare.Rn stated pt was release from Dr. Leonie Man care as of 08/27/2016 at last visit. Rn stated when pts are stable and need yearly carotid dopplers the PCP can refer them to vein vascular. Rn stated the vein vascular can see Dr. Leonie Man notes and results because they are part of the [facility]. PTs wife verbalized understanding and will keep the appt that was handle by the PCP to the vein vascular doctor.

## 2017-01-27 ENCOUNTER — Ambulatory Visit (INDEPENDENT_AMBULATORY_CARE_PROVIDER_SITE_OTHER): Payer: Medicare Other | Admitting: Vascular Surgery

## 2017-01-27 ENCOUNTER — Encounter (INDEPENDENT_AMBULATORY_CARE_PROVIDER_SITE_OTHER): Payer: Self-pay | Admitting: Vascular Surgery

## 2017-01-27 VITALS — BP 116/64 | HR 74 | Resp 16 | Ht 69.5 in | Wt 186.0 lb

## 2017-01-27 DIAGNOSIS — E7800 Pure hypercholesterolemia, unspecified: Secondary | ICD-10-CM | POA: Diagnosis not present

## 2017-01-27 DIAGNOSIS — I6320 Cerebral infarction due to unspecified occlusion or stenosis of unspecified precerebral arteries: Secondary | ICD-10-CM | POA: Diagnosis not present

## 2017-01-27 DIAGNOSIS — I10 Essential (primary) hypertension: Secondary | ICD-10-CM

## 2017-01-27 DIAGNOSIS — I6529 Occlusion and stenosis of unspecified carotid artery: Secondary | ICD-10-CM | POA: Insufficient documentation

## 2017-01-27 DIAGNOSIS — I6523 Occlusion and stenosis of bilateral carotid arteries: Secondary | ICD-10-CM | POA: Diagnosis not present

## 2017-01-27 DIAGNOSIS — I639 Cerebral infarction, unspecified: Secondary | ICD-10-CM | POA: Insufficient documentation

## 2017-01-27 NOTE — Assessment & Plan Note (Signed)
blood pressure control important in reducing the progression of atherosclerotic disease. On appropriate oral medications.  

## 2017-01-27 NOTE — Patient Instructions (Signed)
Carotid Artery Disease The carotid arteries are arteries on both sides of the neck. They carry blood to the brain. Carotid artery disease is when the arteries get smaller (narrow) or get blocked. If these arteries get smaller or get blocked, you are more likely to have a stroke or warning stroke (transient ischemic attack). Follow these instructions at home:  Take medicines as told by your doctor. Make sure you understand all your medicine instructions. Do not stop your medicines without talking to your doctor first.  Follow your doctor's diet instructions. It is important to eat a healthy diet that includes plenty of: ? Fresh fruits. ? Vegetables. ? Lean meats.  Avoid: ? High-fat foods. ? High-sodium foods. ? Foods that are fried, overly processed, or have poor nutritional value.  Stay a healthy weight.  Stay active. Get at least 30 minutes of activity every day.  Do not smoke.  Limit alcohol use to: ? No more than 2 drinks a day for men. ? No more than 1 drink a day for women who are not pregnant.  Do not use illegal drugs.  Keep all doctor visits as told. Get help right away if:  You have sudden weakness or loss of feeling (numbness) on one side of the body, such as the face, arm, or leg.  You have sudden confusion.  You have trouble speaking (aphasia) or understanding.  You have sudden trouble seeing out of one or both eyes.  You have sudden trouble walking.  You have dizziness or feel like you might pass out (faint).  You have a loss of balance or your movements are not steady (uncoordinated).  You have a sudden, severe headache with no known cause.  You have trouble swallowing (dysphagia). Call your local emergency services (911 in [place] Do notdrive yourself to the clinic or hospital. This information is not intended to replace advice given to you by your health care provider. Make sure you discuss any questions you have with your health care  provider. Document Released: 06/02/2012 Document Revised: 11/22/2015 Document Reviewed: 12/15/2012 Elsevier Interactive Patient Education  2018 [employer] Inc[employer]  

## 2017-01-27 NOTE — Assessment & Plan Note (Signed)
The patient has a long history of carotid artery stenosis and is 7 years status post right carotid artery stent placement for acute stroke. His most recent duplex suggests velocities in the 50-70% range on the right although they are in the lower end of this range. His left carotid artery has plaque but no hemodynamically significant stenosis. At this point, I would continue Plavix and Lipitor. The degree of stenosis is not high enough to warrant intervention. I would recommend rechecking this in 6 months for follow-up and I will be happy to see him back in any point prior to that if problems develop in the interim.

## 2017-01-27 NOTE — Assessment & Plan Note (Signed)
lipid control important in reducing the progression of atherosclerotic disease. Continue statin therapy  

## 2017-01-27 NOTE — Assessment & Plan Note (Signed)
2011

## 2017-01-27 NOTE — Progress Notes (Signed)
Patient ID: Troy Pugh., male   DOB: Apr 01, 1941, 76 y.o.   MRN: [id number]  Chief Complaint  Patient presents with  . New Evaluation    carotid stenosis    HPI Troy Pugh. is a 76 y.o. male.  I am asked to see the patient by Dr. Doy Hutching for evaluation of carotid artery stenosis.  The patient reports Carotid artery stenting in 2011 for an acute stroke. He has been told following that that there was some narrowing within the stent or that the stent "did not take completely". Not had any recent focal neurologic symptoms. Specifically, the patient denies amaurosis fugax, speech or swallowing difficulties, or arm or leg weakness or numbness. He did have a syncopal episode from dehydration about 3-4 months ago. His most recent duplex suggests velocities in the 50-70% range on the right although they are in the lower end of this range. His left carotid artery has plaque but no hemodynamically significant stenosis.   Past Medical History:  Diagnosis Date  . Carotid stent occlusion ([place])   . High cholesterol   . Hypertension   . Stroke [facility])     Past Surgical History:  Procedure Laterality Date  . CATARACT EXTRACTION    . GALLBLADDER SURGERY      Family History  Problem Relation Age of Onset  . Kidney failure Mother   . Stroke Father    No bleeding disorders, clotting disorders, or aneurysms  Social History Social History  Substance Use Topics  . Smoking status: Never Smoker  . Smokeless tobacco: Never Used  . Alcohol use 1.8 oz/week    1 Glasses of wine, 1 Cans of beer, 1 Shots of liquor per week     Comment: occasionally   No IVDU Married  Allergies  Allergen Reactions  . Other Hives  . Sulfa Antibiotics Hives  . Sulphadimidine [Sulfamethazine]     Current Outpatient Prescriptions  Medication Sig Dispense Refill  . allopurinol (ZYLOPRIM) 100 MG tablet Take 100 mg by mouth daily.     Marland Kitchen atorvastatin (LIPITOR) 10 MG tablet     . Calcium  Carb-Cholecalciferol (CVS CALCIUM + D3) 600-500 MG-UNIT CAPS Take 1 tablet by mouth daily.     . Cholecalciferol (VITAMIN D) 2000 UNITS tablet Take 2,000 Units by mouth daily.    . clopidogrel (PLAVIX) 75 MG tablet Take 75 mg by mouth daily.     . colchicine 0.6 MG tablet Take 0.6 mg by mouth as needed.    . fexofenadine (ALLEGRA) 180 MG tablet Take 180 mg by mouth daily.    . fluticasone (FLONASE) 50 MCG/ACT nasal spray     . folic acid (FOLVITE) 409 MCG tablet Take 400 mcg by mouth daily.    Marland Kitchen ketoconazole (NIZORAL) 2 % shampoo Apply 1 application topically once a week.    . pantoprazole (PROTONIX) 20 MG tablet     . pindolol (VISKEN) 5 MG tablet Take 5 mg by mouth daily.    Marland Kitchen buPROPion (WELLBUTRIN XL) 150 MG 24 hr tablet Take 150 mg by mouth daily.    . tamsulosin (FLOMAX) 0.4 MG CAPS capsule Take 0.4 mg by mouth daily.     No current facility-administered medications for this visit.       REVIEW OF SYSTEMS (Negative unless checked)  Constitutional: [] Weight loss  [] Fever  [] Chills Cardiac: [] Chest pain   [] Chest pressure   [] Palpitations   [] Shortness of breath when laying flat   [] Shortness of breath  at rest   [] Shortness of breath with exertion. Vascular:  [] Pain in legs with walking   [] Pain in legs at rest   [] Pain in legs when laying flat   [] Claudication   [] Pain in feet when walking  [] Pain in feet at rest  [] Pain in feet when laying flat   [] History of DVT   [] Phlebitis   [] Swelling in legs   [] Varicose veins   [] Non-healing ulcers Pulmonary:   [] Uses home oxygen   [] Productive cough   [] Hemoptysis   [] Wheeze  [] COPD   [] Asthma Neurologic:  [] Dizziness  [x] Blackouts   [] Seizures   [x] History of stroke   [] History of TIA  [] Aphasia   [] Temporary blindness   [] Dysphagia   [] Weakness or numbness in arms   [] Weakness or numbness in legs Musculoskeletal:  [x] Arthritis   [] Joint swelling   [] Joint pain   [] Low back pain Hematologic:  [] Easy bruising  [] Easy bleeding    [] Hypercoagulable state   [] Anemic  [] Hepatitis Gastrointestinal:  [] Blood in stool   [] Vomiting blood  [] Gastroesophageal reflux/heartburn   [] Abdominal pain Genitourinary:  [] Chronic kidney disease   [] Difficult urination  [] Frequent urination  [] Burning with urination   [] Hematuria Skin:  [] Rashes   [] Ulcers   [] Wounds Psychological:  [] History of anxiety   []  History of major depression.    Physical Exam BP 116/64   Pulse 74   Resp 16   Ht 5' 9.5" (1.765 m)   Wt 186 lb (84.4 kg)   BMI 27.07 kg/m  Gen:  WD/WN, NAD Head: [place]/AT, No temporalis wasting. Prominent temp pulse not noted. Ear/Nose/Throat: Hearing grossly intact, nares w/o erythema or drainage, oropharynx w/o Erythema/Exudate Eyes: Conjunctiva clear, sclera non-icteric  Neck: trachea midline.  No JVD. Soft right carotid bruit Pulmonary:  Good air movement, clear to auscultation bilaterally.  Cardiac: RRR, normal S1, S2, no Murmurs, rubs or gallops. Vascular:  Vessel Right Left  Radial Palpable Palpable                          PT Palpable Palpable  DP Palpable Palpable   Gastrointestinal: soft, non-tender/non-distended. Musculoskeletal: M/S 5/5 throughout.  Extremities without ischemic changes.  No deformity or atrophy. No significant edema. Neurologic: Sensation grossly intact in extremities.  Symmetrical.  Speech is fluent. Motor exam as listed above. Psychiatric: Judgment intact, Mood & affect appropriate for pt's clinical situation. Dermatologic: No rashes or ulcers noted.  No cellulitis or open wounds.    Radiology No results found.  Labs No results found for this or any previous visit (from the past 2160 hour(s)).  Assessment/Plan:  Stroke [facility]) 2011  High cholesterol lipid control important in reducing the progression of atherosclerotic disease. Continue statin therapy   Hypertension blood pressure control important in reducing the progression of atherosclerotic disease. On appropriate oral  medications.   Carotid stenosis The patient has a long history of carotid artery stenosis and is 7 years status post right carotid artery stent placement for acute stroke. His most recent duplex suggests velocities in the 50-70% range on the right although they are in the lower end of this range. His left carotid artery has plaque but no hemodynamically significant stenosis. At this point, I would continue Plavix and Lipitor. The degree of stenosis is not high enough to warrant intervention. I would recommend rechecking this in 6 months for follow-up and I will be happy to see him back in any point prior to that if problems develop  in the interim.      Leotis Pain 01/27/2017, 1:32 PM   This note was created with Dragon medical transcription system.  Any errors from dictation are unintentional.

## 2017-01-28 ENCOUNTER — Encounter (INDEPENDENT_AMBULATORY_CARE_PROVIDER_SITE_OTHER): Payer: Self-pay

## 2017-07-31 ENCOUNTER — Encounter (INDEPENDENT_AMBULATORY_CARE_PROVIDER_SITE_OTHER): Payer: Self-pay | Admitting: Vascular Surgery

## 2017-07-31 ENCOUNTER — Ambulatory Visit (INDEPENDENT_AMBULATORY_CARE_PROVIDER_SITE_OTHER): Payer: Medicare Other

## 2017-07-31 ENCOUNTER — Ambulatory Visit (INDEPENDENT_AMBULATORY_CARE_PROVIDER_SITE_OTHER): Payer: Medicare Other | Admitting: Vascular Surgery

## 2017-07-31 VITALS — BP 127/74 | HR 78 | Resp 16 | Wt 184.8 lb

## 2017-07-31 DIAGNOSIS — I10 Essential (primary) hypertension: Secondary | ICD-10-CM

## 2017-07-31 DIAGNOSIS — E7800 Pure hypercholesterolemia, unspecified: Secondary | ICD-10-CM | POA: Diagnosis not present

## 2017-07-31 DIAGNOSIS — I6523 Occlusion and stenosis of bilateral carotid arteries: Secondary | ICD-10-CM

## 2017-07-31 DIAGNOSIS — I6320 Cerebral infarction due to unspecified occlusion or stenosis of unspecified precerebral arteries: Secondary | ICD-10-CM | POA: Diagnosis not present

## 2017-07-31 NOTE — Progress Notes (Signed)
MRN : [id number]  Troy Pugh. is a 77 y.o. (12/28/1940) male who presents with chief complaint of  Chief Complaint  Patient presents with  . Carotid    74months follow up  .  History of Present Illness: She returns in follow-up of his carotid disease.  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  He has a previous history of stroke but no recent focal cerebrovascular symptoms.  His carotid duplex today reveals stable stenosis in the 50-75% range of the right carotid artery stent with mild left carotid artery stenosis seen       Past Medical History:  Diagnosis Date  . 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 High cholesterol   . Hypertension   . Stroke [facility])          Past Surgical History:  Procedure Laterality Date  . CATARACT EXTRACTION    . GALLBLADDER SURGERY           Family History  Problem Relation Age of Onset  . Kidney failure Mother   . Stroke Father    No bleeding disorders, clotting disorders, or aneurysms  Social History        Social History   Substance Use Topics   . Smoking status: Never Smoker   . Smokeless tobacco: Never Used   . Alcohol use 1.8 oz/week    1 Glasses of wine, 1 Cans of beer, 1 Shots of liquor per week     Comment: occasionally    No IVDU Married      Allergies  Allergen Reactions  . Other Hives  . Sulfa Antibiotics Hives  . Sulphadimidine [Sulfamethazine]           Current Outpatient Prescriptions  Medication Sig Dispense Refill  . allopurinol (ZYLOPRIM) 100 MG tablet Take 100 mg by mouth daily.     Marland Kitchen atorvastatin (LIPITOR) 10 MG tablet     . Calcium Carb-Cholecalciferol ([employer] CALCIUM + D3) 600-500 MG-UNIT CAPS Take 1 tablet by mouth daily.     . Cholecalciferol (VITAMIN D) 2000 UNITS tablet Take 2,000 Units by mouth daily.    . clopidogrel (PLAVIX) 75 MG tablet Take 75 mg by mouth daily.     . colchicine 0.6 MG  tablet Take 0.6 mg by mouth as needed.    . fexofenadine (ALLEGRA) 180 MG tablet Take 180 mg by mouth daily.    . fluticasone (FLONASE) 50 MCG/ACT nasal spray     . folic acid (FOLVITE) 361 MCG tablet Take 400 mcg by mouth daily.    Marland Kitchen ketoconazole (NIZORAL) 2 % shampoo Apply 1 application topically once a week.    . pantoprazole (PROTONIX) 20 MG tablet     . pindolol (VISKEN) 5 MG tablet Take 5 mg by mouth daily.    Marland Kitchen buPROPion (WELLBUTRIN XL) 150 MG 24 hr tablet Take 150 mg by mouth daily.    . tamsulosin (FLOMAX) 0.4 MG CAPS capsule Take 0.4 mg by mouth daily.     No current facility-administered medications for this visit.       REVIEW OF SYSTEMS (Negative unless checked)  Constitutional: [] Weight loss  [] Fever  [] Chills Cardiac: [] Chest pain   [] Chest pressure   [] Palpitations   [] Shortness of breath when laying flat   [] Shortness of breath at rest   [] Shortness of breath with exertion. Vascular:  [] Pain in legs with walking   [] Pain in legs at rest   []   Pain in legs when laying flat   [] Claudication   [] Pain in feet when walking  [] Pain in feet at rest  [] Pain in feet when laying flat   [] History of DVT   [] Phlebitis   [] Swelling in legs   [] Varicose veins   [] Non-healing ulcers Pulmonary:   [] Uses home oxygen   [] Productive cough   [] Hemoptysis   [] Wheeze  [] COPD   [] Asthma Neurologic:  [] Dizziness  [x] Blackouts   [] Seizures   [x] History of stroke   [] History of TIA  [] Aphasia   [] Temporary blindness   [] Dysphagia   [] Weakness or numbness in arms   [] Weakness or numbness in legs Musculoskeletal:  [x] Arthritis   [] Joint swelling   [] Joint pain   [] Low back pain Hematologic:  [] Easy bruising  [] Easy bleeding   [] Hypercoagulable state   [] Anemic  [] Hepatitis Gastrointestinal:  [] Blood in stool   [] Vomiting blood  [] Gastroesophageal reflux/heartburn   [] Abdominal pain Genitourinary:  [] Chronic kidney disease   [] Difficult urination  [] Frequent urination  [] Burning  with urination   [] Hematuria Skin:  [] Rashes   [] Ulcers   [] Wounds Psychological:  [] History of anxiety   []  History of major depression.     Physical Examination  Vitals:   07/31/17 1411 07/31/17 1412  BP: 116/72 127/74  Pulse: 78   Resp: 16   Weight: 184 lb 12.8 oz (83.8 kg)    Body mass index is 26.9 kg/m. Gen:  WD/WN, NAD Head: [place]/AT, No temporalis wasting. Ear/Nose/Throat: Hearing grossly intact, nares w/o erythema or drainage, trachea midline Eyes: Conjunctiva clear. Sclera non-icteric Neck: Supple.  Right carotid bruit is present Pulmonary:  Good air movement, equal and clear to auscultation bilaterally.  Cardiac: Irregular Vascular:  Vessel Right Left  Radial Palpable Palpable                                     Musculoskeletal: M/S 5/5 throughout.  No deformity or atrophy.  No edema. Neurologic: CN 2-12 intact. Sensation grossly intact in extremities.  Symmetrical.  Speech is fluent. Motor exam as listed above. Psychiatric: Judgment intact, Mood & affect appropriate for pt's clinical situation. Dermatologic: No rashes or ulcers noted.  No cellulitis or open wounds.      CBC Lab Results  Component Value Date   WBC 6.9 10/01/2016   HGB 13.8 10/01/2016   HCT 40.3 10/01/2016   MCV 91.1 10/01/2016   PLT 192 10/01/2016    BMET    Component Value Date/Time   NA 138 10/01/2016 2132   NA 136 03/17/2014 0406   K 3.9 10/01/2016 2132   K 3.2 (L) 03/17/2014 0406   CL 106 10/01/2016 2132   CL 102 03/17/2014 0406   CO2 24 10/01/2016 2132   CO2 24 03/17/2014 0406   GLUCOSE 101 (H) 10/01/2016 2132   GLUCOSE 90 03/17/2014 0406   BUN 20 10/01/2016 2132   BUN 12 03/17/2014 0406   CREATININE 1.44 (H) 10/01/2016 2132   CREATININE 1.19 03/17/2014 0406   CALCIUM 8.8 (L) 10/01/2016 2132   CALCIUM 7.9 (L) 03/17/2014 0406   GFRNONAA 46 (L) 10/01/2016 2132   GFRNONAA >60 03/17/2014 0406   GFRAA 54 (L) 10/01/2016 2132   GFRAA >60 03/17/2014 0406   CrCl  cannot be calculated (Patient's most recent lab result is older than the maximum 21 days allowed.).  COAG Lab Results  Component Value Date   INR 1.3 03/15/2014  INR 0.92 04/01/2010   INR 0.99 08/24/2009    Radiology No results found.   Assessment/Plan Stroke [facility]) 2011  High cholesterol lipid control important in reducing the progression of atherosclerotic disease. Continue statin therapy   Hypertension blood pressure control important in reducing the progression of atherosclerotic disease. On appropriate oral medications.  Carotid stenosis His carotid duplex today reveals stable stenosis in the 50-75% range of the right carotid artery stent with mild left carotid artery stenosis seen. Overall he seems to be doing well and this would not warrant current intervention.  I would recommend he continue on his Plavix and his Lipitor.  We will recheck this in 6 months with carotid duplex.     Leotis Pain, MD  07/31/2017 2:53 PM    This note was created with Dragon medical transcription system.  Any errors from dictation are purely unintentional

## 2017-07-31 NOTE — Assessment & Plan Note (Signed)
His carotid duplex today reveals stable stenosis in the 50-75% range of the right carotid artery stent with mild left carotid artery stenosis seen. Overall he seems to be doing well and this would not warrant current intervention.  I would recommend he continue on his Plavix and his Lipitor.  We will recheck this in 6 months with carotid duplex.

## 2017-07-31 NOTE — Patient Instructions (Signed)
Carotid Artery Disease The carotid arteries are the two main arteries on either side of the neck that supply blood to the brain. Carotid artery disease, also called carotid artery stenosis, is the narrowing or blockage of one or both carotid arteries. Carotid artery disease increases your risk for a stroke or a transient ischemic attack (TIA). A TIA is an episode in which a waxy, fatty substance that accumulates within the artery (plaque) blocks blood flow to the brain. A TIA is considered a "warning stroke." What are the causes?  Buildup of plaque inside the carotid arteries (atherosclerosis) (common).  A weakened outpouching in an artery (aneurysm).  Inflammation of the carotid artery (arteritis).  A fibrous growth within the carotid artery (fibromuscular dysplasia).  Tissue death within the carotid artery due to radiation treatment (post-radiation necrosis).  Decreased blood flow due to spasms of the carotid artery (vasospasm).  Separation of the walls of the carotid artery (carotid dissection). What increases the risk?  High cholesterol (dyslipidemia).  High blood pressure (hypertension).  Smoking.  Obesity.  Diabetes.  Family history of cardiovascular disease.  Inactivity or lack of regular exercise.  Being male. Men have an increased risk of developing atherosclerosis earlier in life than women. What are the signs or symptoms? Carotid artery disease does not cause symptoms. How is this diagnosed? Diagnosis of carotid artery disease may include:  A physical exam. Your health care provider may hear an abnormal sound (bruit) when listening to the carotid arteries.  Specific tests that look at the blood flow in the carotid arteries. These tests include: ? Carotid artery ultrasonography. ? Carotid or cerebral angiography. ? Computerized tomographic angiography (CTA). ? Magnetic resonance angiography (MRA).  How is this treated? Treatment of carotid artery disease can  include a combination of treatments. Treatment options include:  Surgery. You may have: ? A carotid endarterectomy. This is a surgery to remove the blockages in the carotid arteries. ? A carotid angioplasty with stenting. This is a nonsurgical interventional procedure. A wire mesh (stent) is used to widen the blocked carotid arteries.  Medicines to control blood pressure, cholesterol, and reduce blood clotting (antiplatelet therapy).  Adjusting your diet.  Lifestyle changes such as: ? Quitting smoking. ? Exercising as tolerated or as directed by your health care provider. ? Controlling and maintaining a good blood pressure. ? Keeping cholesterol levels under control.  Follow these instructions at home:  Take medicines only as directed by your health care provider. Make sure you understand all your medicine instructions. Do not stop your medicines without talking to your health care provider.  Follow your health care provider's diet instructions. It is important to eat a healthy diet that is low in saturated fats and includes plenty of fresh fruits, vegetables, and lean meats. High-fat, high-sodium foods as well as foods that are fried, overly processed, or have poor nutritional value should be avoided.  Maintain a healthy weight.  Stay physically active. It is recommended that you get at least 30 minutes of activity every day.  Do not use any tobacco products including cigarettes, chewing tobacco, or electronic cigarettes. If you need help quitting, ask your health care provider.  Limit alcohol use to: ? No more than 2 drinks per day for men. ? No more than 1 drink per day for nonpregnant women.  Do not use illegal drugs.  Keep all follow-up visits as directed by your health care provider. Get help right away if: You develop TIA or stroke symptoms. These include:  Sudden   weakness or numbness on one side of the body, such as in the face, arm, or leg.  Sudden  confusion.  Trouble speaking (aphasia) or understanding.  Sudden trouble seeing out of one or both eyes.  Sudden trouble walking.  Dizziness or feeling like you might faint.  Loss of balance or coordination.  Sudden severe headache with no known cause.  Sudden trouble swallowing (dysphagia).  If you have any of these symptoms, call your local emergency services (911 in [place] Do not drive yourself to the clinic or hospital. This is a medical emergency. This information is not intended to replace advice given to you by your health care provider. Make sure you discuss any questions you have with your health care provider. Document Released: 09/08/2011 Document Revised: 11/22/2015 Document Reviewed: 12/15/2012 Elsevier Interactive Patient Education  2017 Elsevier Inc.  

## 2017-08-05 ENCOUNTER — Encounter (INDEPENDENT_AMBULATORY_CARE_PROVIDER_SITE_OTHER): Payer: Self-pay | Admitting: Internal Medicine

## 2018-01-29 ENCOUNTER — Ambulatory Visit (INDEPENDENT_AMBULATORY_CARE_PROVIDER_SITE_OTHER): Payer: Medicare Other | Admitting: Vascular Surgery

## 2018-01-29 ENCOUNTER — Encounter (INDEPENDENT_AMBULATORY_CARE_PROVIDER_SITE_OTHER): Payer: Medicare Other

## 2018-02-03 ENCOUNTER — Encounter (INDEPENDENT_AMBULATORY_CARE_PROVIDER_SITE_OTHER): Payer: Medicare Other

## 2018-02-03 ENCOUNTER — Ambulatory Visit (INDEPENDENT_AMBULATORY_CARE_PROVIDER_SITE_OTHER): Payer: Medicare Other | Admitting: Vascular Surgery

## 2018-03-17 ENCOUNTER — Ambulatory Visit (INDEPENDENT_AMBULATORY_CARE_PROVIDER_SITE_OTHER): Payer: Medicare Other | Admitting: Vascular Surgery

## 2018-03-17 ENCOUNTER — Ambulatory Visit (INDEPENDENT_AMBULATORY_CARE_PROVIDER_SITE_OTHER): Payer: Medicare Other

## 2018-03-17 ENCOUNTER — Encounter (INDEPENDENT_AMBULATORY_CARE_PROVIDER_SITE_OTHER): Payer: Self-pay | Admitting: Vascular Surgery

## 2018-03-17 VITALS — BP 120/72 | HR 72 | Resp 16 | Ht 69.0 in | Wt 183.0 lb

## 2018-03-17 DIAGNOSIS — E7800 Pure hypercholesterolemia, unspecified: Secondary | ICD-10-CM

## 2018-03-17 DIAGNOSIS — I6523 Occlusion and stenosis of bilateral carotid arteries: Secondary | ICD-10-CM

## 2018-03-17 DIAGNOSIS — I10 Essential (primary) hypertension: Secondary | ICD-10-CM

## 2018-03-17 NOTE — Progress Notes (Signed)
Subjective:    Patient ID: Troy Fridge., male    DOB: Apr 19, 1941, 77 y.o.   MRN: [id number] Chief Complaint  Patient presents with  . Follow-up    50month carotid    Patient presents for a six non-invasive study follow up for carotid stenosis. The stenosis has been followed by surveillance duplexes. The patient underwent a bilateral carotid duplex scan which showed no change from the previous exam on 07/31/17. Duplex is stable with a patent right carotid stent (50-75%) stenosis and Left ICA stenosis (1-39%). Bilateral vertebral arteries demonstrate antegrade. Normal flow hemodynamics were seen in the bilateral subclavian arteries. The patient denies experiencing Amaurosis Fugax, TIA like symptoms or focal motor deficits.  The patient denies any fever, nausea vomiting.  Review of Systems  Constitutional: Negative.   HENT: Negative.   Eyes: Negative.   Respiratory: Negative.   Cardiovascular:       Carotid Stenosis  Gastrointestinal: Negative.   Endocrine: Negative.   Genitourinary: Negative.   Musculoskeletal: Negative.   Skin: Negative.   Allergic/Immunologic: Negative.   Neurological: Negative.   Hematological: Negative.   Psychiatric/Behavioral: Negative.       Objective:   Physical Exam  Constitutional: He is oriented to person, place, and time. He appears well-developed and well-nourished.  HENT:  Head: Normocephalic and atraumatic.  Right Ear: External ear normal.  Left Ear: External ear normal.  Mouth/Throat: Oropharynx is clear and moist.  Eyes: Pupils are equal, round, and reactive to light. Conjunctivae and EOM are normal.  Neck: Normal range of motion.  No carotid bruits noted on exam  Cardiovascular: Normal rate, regular rhythm, normal heart sounds and intact distal pulses.  Pulses:      Radial pulses are 2+ on the right side, and 2+ on the left side.  Pulmonary/Chest: Effort normal and breath sounds normal.  Musculoskeletal: Normal range of motion. He  exhibits no edema.  Neurological: He is alert and oriented to person, place, and time.  Skin: Skin is warm and dry.  Psychiatric: He has a normal mood and affect. His behavior is normal. Judgment and thought content normal.  Vitals reviewed.  BP 120/72 (BP Location: Right Arm)   Pulse 72   Resp 16   Ht 5\' 9"  (1.753 m)   Wt 183 lb (83 kg)   BMI 27.02 kg/m   Past Medical History:  Diagnosis Date  . 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 High cholesterol   . Hypertension   . Stroke [facility])    Social History   Socioeconomic History  . Marital status: Married    Spouse name: chris  . Number of children: 0  . Years of education: college  . Highest education level: Not on file  Occupational History  . Not on file  Social Needs  . Financial resource strain: Not on file  . Food insecurity:    Worry: Not on file    Inability: Not on file  . Transportation needs:    Medical: Not on file    Non-medical: Not on file  Tobacco Use  . Smoking status: Never Smoker  . Smokeless tobacco: Never Used  Substance and Sexual Activity  . Alcohol use: Yes    Alcohol/week: 3.0 standard drinks    Types: 1 Glasses of wine, 1 Cans of beer, 1 Shots of liquor per week    Comment: occasionally  . Drug use: No  . Sexual activity: Not on file  Lifestyle  . Physical activity:  Days per week: Not on file    Minutes per session: Not on file  . Stress: Not on file  Relationships  . Social connections:    Talks on phone: Not on file    Gets together: Not on file    Attends religious service: Not on file    Active member of club or organization: Not on file    Attends meetings of clubs or organizations: Not on file    Relationship status: Not on file  . Intimate partner violence:    Fear of current or ex partner: Not on file    Emotionally abused: Not on file    Physically abused: Not on file    Forced sexual activity: Not on file  Other Topics Concern  . Not on file  Social History Narrative     Patient is married, no children.   Patient is-- handed.   Patient has college education.   Patient drinks -cups daily.   Past Surgical History:  Procedure Laterality Date  . CATARACT EXTRACTION    . GALLBLADDER SURGERY     Family History  Problem Relation Age of Onset  . Kidney failure Mother   . Stroke Father    Allergies  Allergen Reactions  . Other Hives  . Sulfa Antibiotics Hives  . Sulphadimidine [Sulfamethazine]       Assessment & Plan:  Patient presents for a six non-invasive study follow up for carotid stenosis. The stenosis has been followed by surveillance duplexes. The patient underwent a bilateral carotid duplex scan which showed no change from the previous exam on 07/31/17. Duplex is stable with a patent right carotid stent (50-75%) stenosis and Left ICA stenosis (1-39%). Bilateral vertebral arteries demonstrate antegrade. Normal flow hemodynamics were seen in the bilateral subclavian arteries. The patient denies experiencing Amaurosis Fugax, TIA like symptoms or focal motor deficits.  The patient denies any fever, nausea vomiting.  1. Bilateral carotid artery stenosis - Stable Studies reviewed with patient. Patient asymptomatic with stable duplex.  No intervention at this time.  Patient to return in one year for surveillance carotid duplex. Patient to continue medical optimization with Plavix and dyslipidemia medication. Patient to remain abstinent of tobacco use. I have discussed with the patient at length the risk factors for and pathogenesis of atherosclerotic disease and encouraged a healthy diet, regular exercise regimen and blood pressure / glucose control.  Patient was instructed to contact our office in the interim with problems such as arm / leg weakness or numbness, speech / swallowing difficulty or temporary monocular blindness. The patient expresses their understanding.   - VAS US CAROTID; Future  2. High cholesterol - Stable On Statin Encouraged good  control as its slows the progression of atherosclerotic disease  3. Essential hypertension - Stable Encouraged good control as its slows the progression of atherosclerotic disease  Current Outpatient Medications on File Prior to Visit  Medication Sig Dispense Refill  . allopurinol (ZYLOPRIM) 100 MG tablet Take 100 mg by mouth daily.     Marland Kitchen atorvastatin (LIPITOR) 10 MG tablet     . Calcium Carb-Cholecalciferol ([employer] CALCIUM + D3) 600-500 MG-UNIT CAPS Take 1 tablet by mouth daily.     . Cholecalciferol (VITAMIN D) 2000 UNITS tablet Take 2,000 Units by mouth daily.    . clopidogrel (PLAVIX) 75 MG tablet Take 75 mg by mouth daily.     . colchicine 0.6 MG tablet Take 0.6 mg by mouth as needed.    . fexofenadine (ALLEGRA) 180 MG  tablet Take 180 mg by mouth as needed.     . fluticasone (FLONASE) 50 MCG/ACT nasal spray     . folic acid (FOLVITE) 709 MCG tablet Take 400 mcg by mouth daily.    . pantoprazole (PROTONIX) 20 MG tablet     . tamsulosin (FLOMAX) 0.4 MG CAPS capsule Take 0.4 mg by mouth daily.    Marland Kitchen venlafaxine XR (EFFEXOR-XR) 75 MG 24 hr capsule     . buPROPion (WELLBUTRIN XL) 150 MG 24 hr tablet Take 150 mg by mouth daily.    Marland Kitchen ketoconazole (NIZORAL) 2 % shampoo Apply 1 application topically once a week.    . pindolol (VISKEN) 5 MG tablet Take 5 mg by mouth daily.     No current facility-administered medications on file prior to visit.    There are no Patient Instructions on file for this visit. No follow-ups on file.  Virna Livengood A Cosandra Plouffe, PA-C

## 2019-03-21 ENCOUNTER — Ambulatory Visit (INDEPENDENT_AMBULATORY_CARE_PROVIDER_SITE_OTHER): Payer: Medicare Other | Admitting: Nurse Practitioner

## 2019-03-21 ENCOUNTER — Encounter (INDEPENDENT_AMBULATORY_CARE_PROVIDER_SITE_OTHER): Payer: Self-pay

## 2019-03-21 ENCOUNTER — Encounter (INDEPENDENT_AMBULATORY_CARE_PROVIDER_SITE_OTHER): Payer: Self-pay | Admitting: Nurse Practitioner

## 2019-03-21 ENCOUNTER — Other Ambulatory Visit: Payer: Self-pay

## 2019-03-21 ENCOUNTER — Ambulatory Visit (INDEPENDENT_AMBULATORY_CARE_PROVIDER_SITE_OTHER): Payer: Medicare Other

## 2019-03-21 VITALS — BP 170/84 | HR 70 | Resp 16 | Wt 178.0 lb

## 2019-03-21 DIAGNOSIS — I10 Essential (primary) hypertension: Secondary | ICD-10-CM | POA: Diagnosis not present

## 2019-03-21 DIAGNOSIS — E7800 Pure hypercholesterolemia, unspecified: Secondary | ICD-10-CM

## 2019-03-21 DIAGNOSIS — I6523 Occlusion and stenosis of bilateral carotid arteries: Secondary | ICD-10-CM

## 2019-03-21 NOTE — Progress Notes (Signed)
SUBJECTIVE:  Patient ID: Troy Pugh., male    DOB: [birth year], 78 y.o.   MRN: [id number] Chief Complaint  Patient presents with  . Follow-up    ultrasound follow up    HPI  Troy Pugh. is a 78 y.o. male The patient is seen for follow up evaluation of carotid stenosis. The carotid stenosis followed by ultrasound.   The patient denies amaurosis fugax. There is no recent history of TIA symptoms or focal motor deficits. There is no prior documented CVA.  The patient is taking plavix  daily.  There is no history of migraine headaches. There is no history of seizures.  The patient has a history of coronary artery disease, no recent episodes of angina or shortness of breath. The patient denies PAD or claudication symptoms. There is a history of hyperlipidemia which is being treated with a statin.    Carotid Duplex done today shows 1 to 39% stenosis of the left internal carotid artery with 40 to 59% stenosis of the right internal carotid artery, patent right carotid stent.  No change compared to last study in 03/17/2018  Past Medical History:  Diagnosis Date  . 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 High cholesterol   . Hypertension   . Stroke [facility])     Past Surgical History:  Procedure Laterality Date  . CATARACT EXTRACTION    . GALLBLADDER SURGERY      Social History   Socioeconomic History  . Marital status: Married    Spouse name: chris  . Number of children: 0  . Years of education: college  . Highest education level: Not on file  Occupational History  . Not on file  Social Needs  . Financial resource strain: Not on file  . Food insecurity    Worry: Not on file    Inability: Not on file  . Transportation needs    Medical: Not on file    Non-medical: Not on file  Tobacco Use  . Smoking status: Never Smoker  . Smokeless tobacco: Never Used  Substance and Sexual Activity  . Alcohol use: Yes    Alcohol/week: 3.0 standard drinks    Types: 1 Glasses  of wine, 1 Cans of beer, 1 Shots of liquor per week    Comment: occasionally  . Drug use: No  . Sexual activity: Not on file  Lifestyle  . Physical activity    Days per week: Not on file    Minutes per session: Not on file  . Stress: Not on file  Relationships  . Social Herbalist on phone: Not on file    Gets together: Not on file    Attends religious service: Not on file    Active member of club or organization: Not on file    Attends meetings of clubs or organizations: Not on file    Relationship status: Not on file  . Intimate partner violence    Fear of current or ex partner: Not on file    Emotionally abused: Not on file    Physically abused: Not on file    Forced sexual activity: Not on file  Other Topics Concern  . Not on file  Social History Narrative   Patient is married, no children.   Patient is-- handed.   Patient has college education.   Patient drinks -cups daily.    Family History  Problem Relation Age of Onset  . Kidney failure Mother   .  Stroke Father     Allergies  Allergen Reactions  . Other Hives  . Sulfa Antibiotics Hives  . Sulphadimidine [Sulfamethazine]      Review of Systems   Review of Systems: Negative Unless Checked Constitutional: [] Weight loss  [] Fever  [] Chills Cardiac: [] Chest pain   []  Atrial Fibrillation  [] Palpitations   [] Shortness of breath when laying flat   [] Shortness of breath with exertion. [] Shortness of breath at rest Vascular:  [] Pain in legs with walking   [] Pain in legs with standing [] Pain in legs when laying flat   [] Claudication    [] Pain in feet when laying flat    [] History of DVT   [] Phlebitis   [] Swelling in legs   [] Varicose veins   [] Non-healing ulcers Pulmonary:   [] Uses home oxygen   [] Productive cough   [] Hemoptysis   [] Wheeze  [] COPD   [] Asthma Neurologic:  [] Dizziness   [] Seizures  [] Blackouts [x] History of stroke   [] History of TIA  [] Aphasia   [] Temporary Blindness   [] Weakness or numbness in  arm   [x] Weakness or numbness in leg Musculoskeletal:   [] Joint swelling   [] Joint pain   [] Low back pain  []  History of Knee Replacement [] Arthritis [] back Surgeries  []  Spinal Stenosis    Hematologic:  [] Easy bruising  [] Easy bleeding   [] Hypercoagulable state   [] Anemic Gastrointestinal:  [] Diarrhea   [] Vomiting  [] Gastroesophageal reflux/heartburn   [] Difficulty swallowing. [] Abdominal pain Genitourinary:  [] Chronic kidney disease   [] Difficult urination  [] Anuric   [] Blood in urine [] Frequent urination  [] Burning with urination   [] Hematuria Skin:  [] Rashes   [] Ulcers [] Wounds Psychological:  [] History of anxiety   []  History of major depression  []  Memory Difficulties      OBJECTIVE:   Physical Exam  BP (!) 170/84 (BP Location: Left Arm)   Pulse 70   Resp 16   Wt 178 lb (80.7 kg)   BMI 26.29 kg/m   Gen: WD/WN, NAD Head: [place]/AT, No temporalis wasting.  Ear/Nose/Throat: Hearing grossly intact, nares w/o erythema or drainage Eyes: PER, EOMI, sclera nonicteric.  Neck: Supple, no masses.  No JVD.  Pulmonary:  Good air movement, no use of accessory muscles.  Cardiac: RRR Vascular:  Vessel Right Left  Radial Palpable Palpable   Gastrointestinal: soft, non-distended. No guarding/no peritoneal signs.  Musculoskeletal: M/S 5/5 throughout.  No deformity or atrophy.  Neurologic: Pain and light touch intact in extremities.  Symmetrical.  Speech is fluent. Motor exam as listed above. Psychiatric: Judgment intact, Mood & affect appropriate for pt's clinical situation. Dermatologic: No Venous rashes. No Ulcers Noted.  No changes consistent with cellulitis. Lymph : No Cervical lymphadenopathy, no lichenification or skin changes of chronic lymphedema.       ASSESSMENT AND PLAN:  1. Bilateral carotid artery stenosis Recommend:  Given the patient's asymptomatic subcritical stenosis no further invasive testing or surgery at this time.  Carotid Duplex done today shows 1 to 39% stenosis  of the left internal carotid artery with 40 to 59% stenosis of the right internal carotid artery  Continue antiplatelet therapy as prescribed Continue management of CAD, HTN and Hyperlipidemia Healthy heart diet,  encouraged exercise at least 4 times per week Follow up in 12 months with duplex ultrasound and physical exam  - VAS US CAROTID; Future  2. High cholesterol Continue statin as ordered and reviewed, no changes at this time   3. Essential hypertension Continue antihypertensive medications as already ordered, these medications have been reviewed and there  are no changes at this time.  However, the patient states that he has been taking medication in order to raise his blood pressure however today it is elevated.  I have advised the patient to contact his PCP to determine if he needs modification of these medications.    Current Outpatient Medications on File Prior to Visit  Medication Sig Dispense Refill  . allopurinol (ZYLOPRIM) 100 MG tablet Take 100 mg by mouth daily.     Marland Kitchen atorvastatin (LIPITOR) 10 MG tablet     . buPROPion (WELLBUTRIN XL) 150 MG 24 hr tablet Take 150 mg by mouth daily.    . Calcium Carb-Cholecalciferol ([employer] CALCIUM + D3) 600-500 MG-UNIT CAPS Take 1 tablet by mouth daily.     . Cholecalciferol (VITAMIN D) 2000 UNITS tablet Take 2,000 Units by mouth daily.    . clopidogrel (PLAVIX) 75 MG tablet Take 75 mg by mouth daily.     . colchicine 0.6 MG tablet Take 0.6 mg by mouth as needed.    . fexofenadine (ALLEGRA) 180 MG tablet Take 180 mg by mouth as needed.     . fluticasone (FLONASE) 50 MCG/ACT nasal spray     . folic acid (FOLVITE) A999333 MCG tablet Take 400 mcg by mouth daily.    Marland Kitchen ketoconazole (NIZORAL) 2 % shampoo Apply 1 application topically once a week.    . pantoprazole (PROTONIX) 20 MG tablet     . pindolol (VISKEN) 5 MG tablet Take 5 mg by mouth daily.    . tamsulosin (FLOMAX) 0.4 MG CAPS capsule Take 0.4 mg by mouth daily.    Marland Kitchen venlafaxine XR  (EFFEXOR-XR) 75 MG 24 hr capsule      No current facility-administered medications on file prior to visit.     There are no Patient Instructions on file for this visit. No follow-ups on file.   Kris Hartmann, NP  This note was completed with Sales executive.  Any errors are purely unintentional.

## 2019-05-04 ENCOUNTER — Other Ambulatory Visit: Payer: Self-pay | Admitting: Internal Medicine

## 2019-05-04 DIAGNOSIS — R270 Ataxia, unspecified: Secondary | ICD-10-CM

## 2019-05-04 DIAGNOSIS — R4182 Altered mental status, unspecified: Secondary | ICD-10-CM

## 2019-05-15 ENCOUNTER — Other Ambulatory Visit: Payer: Self-pay

## 2019-05-15 ENCOUNTER — Ambulatory Visit
Admission: RE | Admit: 2019-05-15 | Discharge: 2019-05-15 | Disposition: A | Discharge status: Home / Self Care | Payer: Medicare Other | Source: Ambulatory Visit | Attending: Internal Medicine | Admitting: Internal Medicine

## 2019-05-15 DIAGNOSIS — R270 Ataxia, unspecified: Secondary | ICD-10-CM | POA: Diagnosis present

## 2019-05-15 DIAGNOSIS — R4182 Altered mental status, unspecified: Secondary | ICD-10-CM | POA: Diagnosis present

## 2019-06-28 ENCOUNTER — Ambulatory Visit: Payer: Federal, State, Local not specified - PPO | Admitting: Speech Pathology

## 2019-07-05 ENCOUNTER — Encounter: Payer: Federal, State, Local not specified - PPO | Admitting: Speech Pathology

## 2019-07-28 DIAGNOSIS — F01518 Vascular dementia, unspecified severity, with other behavioral disturbance: Secondary | ICD-10-CM | POA: Insufficient documentation

## 2019-11-10 ENCOUNTER — Ambulatory Visit: Payer: Medicare Other | Attending: Internal Medicine

## 2019-11-10 ENCOUNTER — Other Ambulatory Visit: Payer: Self-pay

## 2019-11-10 DIAGNOSIS — Z9181 History of falling: Secondary | ICD-10-CM | POA: Diagnosis present

## 2019-11-10 DIAGNOSIS — R2681 Unsteadiness on feet: Secondary | ICD-10-CM | POA: Insufficient documentation

## 2019-11-10 NOTE — Therapy (Signed)
[place] MAIN [facility] SERVICES [address] [place], [place], [address] Phone: [phone]   Fax:  [phone]  Physical Therapy Evaluation  Patient Details  Name: Troy Pugh. MRN: [id number] Date of Birth: Jun 18, 1941 Referring Provider (PT): Dr. Doy Hutching   Encounter Date: 11/10/2019  PT End of Session - 11/12/19 1405    Visit Number  1    Number of Visits  25    Date for PT Re-Evaluation  02/02/20    Authorization Type  eval: 11/10/19    PT Start Time  1105    PT Stop Time  1205    PT Time Calculation (min)  60 min    Equipment Utilized During Treatment  Gait belt    Activity Tolerance  Patient tolerated treatment well    Behavior During Therapy  WFL for tasks assessed/performed       Past Medical History:  Diagnosis Date  . 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 High cholesterol   . Hypertension   . Stroke [facility])     Past Surgical History:  Procedure Laterality Date  . CATARACT EXTRACTION    . GALLBLADDER SURGERY      There were no vitals filed for this visit.   Subjective Assessment - 11/12/19 1403    Subjective  Unsteadiness    Pertinent History  Pt suffered a CVA 10 years ago. Due to recent worsening balance and falls his MD ordered PT. He started [facility] PT in January and had his last therapy session this Monday. Pt reports that he was working on balance and strengthening with therapy. He has been experiencing worsening L drop foot over the last year or so and now has an AFO which he picked up 2-3 weeks ago. Pt reports improvement in his gait since getting his AFO. He has had at least 6 falls in the last 12 months. He initially had PT at [facility] after his CVA but was doing relatively well until recently. He has a history of vascular dementia and started Aricept last night. History is felt to be unreliable from patient and most information is provided by his wife.    Patient Stated Goals  Improve balance, gait, and decrease risk for  falls    Currently in Pain?  No/denies           SUBJECTIVE Onset: Pt suffered a CVA 10 years ago. Due to recent worsening balance and falls his MD ordered PT. He started [name] PT in January and had his last therapy session this Monday. Pt reports that he was working on balance and strengthening with therapy. He has been experiencing worsening L drop foot over the last year or so and now has an AFO which he picked up 2-3 weeks ago. Pt reports improvement in his gait since getting his AFO. He has had at least 6 falls in the last 12 months. He initially had PT at [name] after his CVA but was doing relatively well until recently. He has a history of vascular dementia and started Aricept last night. History is felt to be unreliable from patient and most information is provided by his wife.  Recent changes in overall health/medication: Pt started on Aricept last night Directional pattern for falls: Forward Prior history of physical therapy for balance: PT at [name] immediatly after CVA. Pt has been receiving [name] PT since January which ended this Monday. Follow-up appointment with MD: PCP tomorrow  Red flags (bowel/bladder changes, saddle paresthesia, personal history of  cancer, chills/fever, night sweats, unrelenting pain) Negative with the exception of 34# weight loss over the last 6 months, dysphagia and dysarthria since CVA  OBJECTIVE  MUSCULOSKELETAL: Tremor: Absent Bulk: Normal Tone: Decreased L ankle PROM dorsiflexion  Posture Forward head and rounded shoulders  Gait L foot drop with AFO but persistent LLE drag. Self-selected gait speed below what is necessary for full community ambulation. HH PT recommended pt use a rollator but pt refuses. Wife has bought a cane for him to trial but he isn't using.   Strength R/L 5/5 Hip flexion Actively resists abduction/adduction in sitting with minor weakness noted in L hip; 5/5 Knee extension 5/5 Knee flexion Decreased L ankle plantarflexion  strength noted; 5/4+ Ankle Dorsiflexion   NEUROLOGICAL:  Mental Status Patient is oriented to person, place and time.  Recent and remote memory are impaired;  Attention span and concentration are intact.  Expressive speech is intact but speech is slurred Patient's fund of knowledge is within normal limits for educational level.  Cranial Nerves Deferred  Sensation Grossly intact to light touch bilateral UEs/LEs as determined by testing dermatomes C2-T2/L2-S2 respectively Proprioception and hot/cold testing deferred on this date  Reflexes Deferred  Coordination/Cerebellar Deferred  FUNCTIONAL OUTCOME MEASURES   Results Comments  BERG 51/56 Fall risk, in need of intervention  TUG 11.1 seconds WNL  Cognitive TUG 20.9 seconds Unable to perform serial 7 or 3 subtraction, used boys names  5TSTS 10.2 seconds WNL, minimal weight shifting to LLE  10 Meter Gait Speed Self-selected: 11.9s = 0.84 m/s; Fastest: 7.9s = 1.27 m/s Self-selected speed below normative values for full community ambulation  ABC Scale 43.125% Completed by wife, below cut-off  FOTO 58 Predicted improvement to 68    POSTURAL CONTROL TESTS   Modified Clinical Test of Sensory Interaction for Balance    (CTSIB): Deferred  OCULOMOTOR / VESTIBULAR TESTING: Deferred  BPPV TESTS: Deferred    [name] PT Assessment - 11/12/19 1404      Assessment   Medical Diagnosis  CVA    Referring Provider (PT)  Dr. Doy Hutching    Onset Date/Surgical Date  11/09/09   Approximate   Hand Dominance  Right    Next MD Visit  Tomorrow with Dr. Doy Hutching    Prior Therapy  Yes, remote history of OP PT, recent [name] PT      Precautions   Precautions  Fall      Restrictions   Weight Bearing Restrictions  No      Balance Screen   Has the patient fallen in the past 6 months  Yes    How many times?  6   In the last 12 months   Has the patient had a decrease in activity level because of a fear of falling?   No    Is the patient reluctant  to leave their home because of a fear of falling?   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[employer], watch TV      Cognition   Overall Cognitive Status  Within Functional Limits for tasks assessed      Standardized Balance Assessment   Standardized Balance Assessment  Berg Balance Test      Berg Balance Test   Sit to Stand  Able to stand without using hands and stabilize independently    Standing Unsupported  Able to stand safely 2 minutes    Sitting with Back Unsupported but Feet Supported on Floor or Stool  Able to sit safely and securely 2 minutes    Stand to Sit  Sits safely with minimal use of hands    Transfers  Able to transfer safely, minor use of hands    Standing Unsupported with Eyes Closed  Able to stand 10 seconds safely    Standing Unsupported with Feet Together  Able to place feet together independently and stand 1 minute safely    From Standing, Reach Forward with Outstretched Arm  Can reach confidently >25 cm (10")    From Standing Position, Pick up Object from Floor  Able to pick up shoe safely and easily    From Standing Position, Turn to Look Behind Over each Shoulder  Looks behind from both sides and weight shifts well    Turn 360 Degrees  Able to turn 360 degrees safely but slowly    Standing Unsupported, Alternately Place Feet on Step/Stool  Able to stand independently and safely and complete 8 steps in 20 seconds    Standing Unsupported, One Foot in  Front  Able to plae foot ahead of the other independently and hold 30 seconds    Standing on One Leg  Able to lift leg independently and hold equal to or more than 3 seconds    Total Score  51                  Objective measurements completed on examination: See above findings.        TREATMENT   Ther-ex  Seated clams with green tband 2s hold 2 x 10; Seated marches with green tband 2s hold 2 x 10; Sit to stand without UE support 2 x 10; HEP issued to patient and wife with extensive education about how to perform correctly;            PT Short Term Goals - 11/10/19 1559      PT SHORT TERM GOAL #1   Title  Pt will be independent with HEP in order to improve strength and balance in order to decrease fall risk and improve function at home.    Time  6    Period  Weeks    Status  New    Target Date  12/22/19        PT Long Term Goals - 11/10/19 1600      PT LONG TERM GOAL #1   Title  Pt will improve BERG by at least 3 points in order to demonstrate clinically significant improvement in balance.    Baseline  11/10/19: 51/56    Time  12    Period  Weeks    Status  New    Target Date  02/02/20      PT LONG TERM GOAL #2   Title  Pt will improve ABC by at least 13% in order to demonstrate clinically  significant improvement in balance confidence.    Baseline  11/10/19: 43.125%    Time  12    Period  Weeks    Status  New    Target Date  02/02/20      PT LONG TERM GOAL #3   Title  Pt will improve FOTO to at least 68 in order to demonstrate significant improvement in function at home.    Baseline  11/10/19: 58    Time  12    Period  Weeks    Status  New    Target Date  02/02/20             Plan - 11/12/19 1411    Clinical Impression Statement  Pt is a pleasant 79 year-old male referred for difficulty with balance. PT examination reveals deficits in cognition and balance. His TUG is normal at 11.1s but his cognitive TUG is 20.9s with pt struggling  considerably with attempts at multiple cognitive tasks. His BERG is 51/56 indicating increased risk for falls and his ABC that was completed by wife show low balance confidence. He ambulates with L AFO however still demonstrates considerable L foot drag. Pt presents with deficits in strength, gait and balance and will benefit from skilled PT services to address these deficits and decrease risk for future falls.    Personal Factors and Comorbidities  Age;Comorbidity 2    Comorbidities  CVA, vascular dementia    Examination-Activity Limitations  Locomotion Level;Transfers    Examination-Participation Restrictions  Community Activity;Shop    Stability/Clinical Decision Making  Evolving/Moderate complexity    Clinical Decision Making  Moderate    Rehab Potential  Fair    PT Frequency  2x / week    PT Duration  12 weeks    PT Treatment/Interventions  ADLs/Self Care Home Management;Aquatic Therapy;Biofeedback;Cryotherapy;Canalith Repostioning;Electrical Stimulation;Iontophoresis 4mg /ml Dexamethasone;Moist Heat;Traction;Ultrasound;DME Instruction;Gait training;Stair training;Functional mobility training;Therapeutic activities;Therapeutic exercise;Balance training;Neuromuscular re-education;Cognitive remediation;Patient/family education;Manual techniques;Passive range of motion;Dry needling;Vestibular    PT Next Visit Plan  Review HEP, consider 6MWT, consider referral for OT or SLP for cognitive tasks    PT [place] Access Code: [email or web address] (clams, marches, sit to stand green tband);    Consulted and Agree with Plan of Care  Patient;Family member/caregiver    Family Member Consulted  Wife       Patient will benefit from skilled therapeutic intervention in order to improve the following deficits and impairments:  Abnormal gait, Decreased balance, Decreased safety awareness, Decreased knowledge of precautions, Decreased cognition, Decreased mobility  Visit Diagnosis: Unsteadiness on  feet  History of falling     Problem List Patient Active Problem List   Diagnosis Date Noted  . Stroke ([place]) 01/27/2017  . High cholesterol 01/27/2017  . Hypertension 01/27/2017  . Carotid stenosis 01/27/2017  . Occlusion and stenosis of carotid artery with cerebral infarction 07/27/2013   Lyndel Safe Troy Pugh PT, DPT, GCS  Troy Pugh 11/12/2019, 2:14 PM  [place] MAIN [facility] SERVICES [address] [place], [place], [address] Phone: [phone]   Fax:  [phone]  Name: Troy Pugh. MRN: [id number] Date of Birth: December 31, 1940

## 2019-11-10 NOTE — Patient Instructions (Signed)
Access Code: [email or web address] URL: [email or web address] Date: 11/10/2019 Prepared by: Roxana Hires  Exercises Seated March with Resistance - 1 x daily - 7 x weekly - 2 sets - 10 reps - 3s hold Seated Hip Abduction with Resistance - 1 x daily - 7 x weekly - 2 sets - 10 reps - 3s hold Sit to Stand without Arm Support - 1 x daily - 7 x weekly - 2 sets - 10 reps

## 2019-11-11 DIAGNOSIS — E538 Deficiency of other specified B group vitamins: Secondary | ICD-10-CM | POA: Insufficient documentation

## 2019-11-14 ENCOUNTER — Other Ambulatory Visit: Payer: Self-pay

## 2019-11-14 ENCOUNTER — Ambulatory Visit: Payer: Medicare Other

## 2019-11-14 VITALS — BP 120/64 | HR 90

## 2019-11-14 DIAGNOSIS — R2681 Unsteadiness on feet: Secondary | ICD-10-CM | POA: Diagnosis not present

## 2019-11-14 DIAGNOSIS — Z9181 History of falling: Secondary | ICD-10-CM

## 2019-11-14 NOTE — Therapy (Signed)
[place] MAIN [facility] SERVICES [address] [place], [place], [address] Phone: [phone]   Fax:  [phone]  Physical Therapy Treatment  Patient Details  Name: Troy Pugh. MRN: [id number] Date of Birth: [birth year] Referring Provider (PT): Dr. Doy Hutching   Encounter Date: 11/14/2019  PT End of Session - 11/14/19 1428    Visit Number  2    Number of Visits  25    Date for PT Re-Evaluation  02/02/20    Authorization Type  eval: 11/10/19    PT Start Time  1430    PT Stop Time  1515    PT Time Calculation (min)  45 min    Equipment Utilized During Treatment  Gait belt    Activity Tolerance  Patient tolerated treatment well    Behavior During Therapy  WFL for tasks assessed/performed       Past Medical History:  Diagnosis Date  . 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 High cholesterol   . Hypertension   . Stroke [facility])     Past Surgical History:  Procedure Laterality Date  . CATARACT EXTRACTION    . GALLBLADDER SURGERY      Vitals:   11/14/19 1433  BP: 120/64  Pulse: 90  SpO2: 98%    Subjective Assessment - 11/14/19 1428    Subjective  Pt reports he is doing well today. Denies any falls since the initial evaluation. No pain reported currently. States he was able to perform his HEP since the initial evaluation. No specific questions or concerns upon arrival.    Pertinent History  Pt suffered a CVA 10 years ago. Due to recent worsening balance and falls his MD ordered PT. He started [facility] PT in January and had his last therapy session this Monday. Pt reports that he was working on balance and strengthening with therapy. He has been experiencing worsening L drop foot over the last year or so and now has an AFO which he picked up 2-3 weeks ago. Pt reports improvement in his gait since getting his AFO. He has had at least 6 falls in the last 12 months. He initially had PT at [facility] after his CVA but was doing relatively well until recently. He  has a history of vascular dementia and started Aricept last night. History is felt to be unreliable from patient and most information is provided by his wife.    Patient Stated Goals  Improve balance, gait, and decrease risk for falls    Currently in Pain?  No/denies         [facility] PT Assessment - 11/14/19 1508      6 Minute Walk- Baseline   6 Minute Walk- Baseline  yes    BP (mmHg)  120/64    HR (bpm)  90    02 Sat (%RA)  98 %    Modified Borg Scale for Dyspnea  0- Nothing at all    Perceived Rate of Exertion (Borg)  6-      6 Minute walk- Post Test   6 Minute Walk Post Test  yes    BP (mmHg)  122/58    HR (bpm)  104    02 Sat (%RA)  99 %    Modified Borg Scale for Dyspnea  0- Nothing at all    Perceived Rate of Exertion (Borg)  13- Somewhat hard      6 minute walk test results    Aerobic Endurance Distance Walked  955  Endurance additional comments  cane in RUE, mask donned, CGA only          TREATMENT   Ther-ex  NuStep L2 x 4 minutes for warm-up during history (3 minutes unbilled); Reviewed HEP with pt including: Seated clams with green tband 2s hold x 20; Seated marches with 3# ankle weights (AW) 2s hold x 20; Sit to stand without UE support 2 x 10;  Seated adductor ball squeeze 2s hold x 20; Seated LAQ with 3# AW x 20 BLE;  6MWT: 955' with single point cane in LUE, CGA;  Quantum single leg press 40# x 10 BLE;   Pt educated throughout session about proper posture and technique with exercises. Improved exercise technique, movement at target joints, use of target muscles after min to mod verbal, visual, tactile cues.    Pt demonstrates excellent motivation during session today. Reviewed and performed HEP with patient today. No progressions added at this time. Performed 6MWT and pt was able to ambulate 955' with single point cane in LUE and CGA from therapist throughout. Initiated single leg press today which will be advanced at future session. Pt will benefit  from PT services to address deficits in strength, balance, and mobility in order to return to full function at home.                          PT Short Term Goals - 11/10/19 1559      PT SHORT TERM GOAL #1   Title  Pt will be independent with HEP in order to improve strength and balance in order to decrease fall risk and improve function at home.    Time  6    Period  Weeks    Status  New    Target Date  12/22/19        PT Long Term Goals - 11/10/19 1600      PT LONG TERM GOAL #1   Title  Pt will improve BERG by at least 3 points in order to demonstrate clinically significant improvement in balance.    Baseline  11/10/19: 51/56    Time  12    Period  Weeks    Status  New    Target Date  02/02/20      PT LONG TERM GOAL #2   Title  Pt will improve ABC by at least 13% in order to demonstrate clinically significant improvement in balance confidence.    Baseline  11/10/19: 43.125%    Time  12    Period  Weeks    Status  New    Target Date  02/02/20      PT LONG TERM GOAL #3   Title  Pt will improve FOTO to at least 68 in order to demonstrate significant improvement in function at home.    Baseline  11/10/19: 58    Time  12    Period  Weeks    Status  New    Target Date  02/02/20            Plan - 11/14/19 1428    Clinical Impression Statement  Pt demonstrates excellent motivation during session today. Reviewed and performed HEP with patient today. No progressions added at this time. Performed 6MWT and pt was able to ambulate 955' with single point cane in LUE and CGA from therapist throughout. Initiated single leg press today which will be advanced at future session. Pt will benefit from PT services to  address deficits in strength, balance, and mobility in order to return to full function at home.    Personal Factors and Comorbidities  Age;Comorbidity 2    Comorbidities  CVA, vascular dementia    Examination-Activity Limitations  Locomotion  Level;Transfers    Examination-Participation Restrictions  Community Activity;Shop    Stability/Clinical Decision Making  Evolving/Moderate complexity    Rehab Potential  Fair    PT Frequency  2x / week    PT Duration  12 weeks    PT Treatment/Interventions  ADLs/Self Care Home Management;Aquatic Therapy;Biofeedback;Cryotherapy;Canalith Repostioning;Electrical Stimulation;Iontophoresis 4mg /ml Dexamethasone;Moist Heat;Traction;Ultrasound;DME Instruction;Gait training;Stair training;Functional mobility training;Therapeutic activities;Therapeutic exercise;Balance training;Neuromuscular re-education;Cognitive remediation;Patient/family education;Manual techniques;Passive range of motion;Dry needling;Vestibular    PT Next Visit Plan  Review HEP, consider 6MWT, consider referral for OT or SLP for cognitive tasks    PT [place] Access Code: [email or web address] (clams, marches, sit to stand green tband);    Consulted and Agree with Plan of Care  Patient;Family member/caregiver    Family Member Consulted  Wife       Patient will benefit from skilled therapeutic intervention in order to improve the following deficits and impairments:  Abnormal gait, Decreased balance, Decreased safety awareness, Decreased knowledge of precautions, Decreased cognition, Decreased mobility  Visit Diagnosis: Unsteadiness on feet  History of falling     Problem List Patient Active Problem List   Diagnosis Date Noted  . Stroke ([place]) 01/27/2017  . High cholesterol 01/27/2017  . Hypertension 01/27/2017  . Carotid stenosis 01/27/2017  . Occlusion and stenosis of carotid artery with cerebral infarction 07/27/2013   Lyndel Safe Jacody Beneke PT, DPT, GCS  Lilton Pare 11/14/2019, 3:39 PM  [place] MAIN [facility] SERVICES [address] [place], [place], [address] Phone: [phone]   Fax:  [phone]  Name: Troy Pugh. MRN: [id number] Date of Birth: April 09, 1941

## 2019-11-17 ENCOUNTER — Ambulatory Visit: Payer: Medicare Other

## 2019-11-17 ENCOUNTER — Other Ambulatory Visit: Payer: Self-pay

## 2019-11-17 DIAGNOSIS — Z9181 History of falling: Secondary | ICD-10-CM

## 2019-11-17 DIAGNOSIS — R2681 Unsteadiness on feet: Secondary | ICD-10-CM | POA: Diagnosis not present

## 2019-11-17 NOTE — Therapy (Signed)
[place] MAIN [facility] SERVICES [address] [place], [place], [address] Phone: [phone]   Fax:  [phone]  Physical Therapy Treatment  Patient Details  Name: Troy Pugh. MRN: [id number] Date of Birth: [birth year] Referring Provider (PT): Dr. Doy Hutching   Encounter Date: 11/17/2019  PT End of Session - 11/17/19 1049    Visit Number  3    Number of Visits  25    Date for PT Re-Evaluation  02/02/20    Authorization Type  eval: 11/10/19    PT Start Time  1100    PT Stop Time  1145    PT Time Calculation (min)  45 min    Equipment Utilized During Treatment  Gait belt    Activity Tolerance  Patient tolerated treatment well    Behavior During Therapy  WFL for tasks assessed/performed       Past Medical History:  Diagnosis Date  . 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 High cholesterol   . Hypertension   . Stroke [facility])     Past Surgical History:  Procedure Laterality Date  . CATARACT EXTRACTION    . GALLBLADDER SURGERY      There were no vitals filed for this visit.  Subjective Assessment - 11/17/19 1049    Subjective  Pt reports he is doing well today. Denies any falls since last therapy session. No pain reported currently. No specific questions or concerns upon arrival.    Pertinent History  Pt suffered a CVA 10 years ago. Due to recent worsening balance and falls his MD ordered PT. He started [facility] PT in January and had his last therapy session this Monday. Pt reports that he was working on balance and strengthening with therapy. He has been experiencing worsening L drop foot over the last year or so and now has an AFO which he picked up 2-3 weeks ago. Pt reports improvement in his gait since getting his AFO. He has had at least 6 falls in the last 12 months. He initially had PT at [facility] after his CVA but was doing relatively well until recently. He has a history of vascular dementia and started Aricept last night. History is felt to be  unreliable from patient and most information is provided by his wife.    Patient Stated Goals  Improve balance, gait, and decrease risk for falls    Currently in Pain?  No/denies          TREATMENT   Ther-ex  NuStep L4 x 4 minutes for warm-up during history (3 minutes unbilled); Quantum single leg press 55# 2 x 20 BLE; Sit to stand without UE support RLE on 5" step to bias LLE 2 x 10;  Standing exercises with 4# ankle weights (AW) x 20 BLE: Hip flexion marches; Hip abduction; HS curls;  6" alternating step-ups x 10 BLE; Assessed L foot drop and pt has significant limitation in his L ankle dorsiflexion range of motion; Standing L gastroc stretch with straight knee 30s x 2, pt requiring extensive verbal and tactile cues with education to patient and wife about how to perform correctly. He continues to struggle. Attempted step gastroc stretch however pt is unable to perform correctly;   Neuromuscular Re-education  Semi tandem balance alternating forward LE x 30s each; Tandem balance alternating forward LE x 30s each;   Pt educated throughout session about proper posture and technique with exercises. Improved exercise technique, movement at target joints, use of target muscles after  min to mod verbal, visual, tactile cues.    Pt demonstrates excellent motivation during session today. Continued with leg press and increased to two reps at 55#. He is lacking considerable L ankle dorsiflexion range of motion which is contributing to his continued difficulty with L foot drag during gait. Pt provided L ankle dorsiflexion stretch however he demonstrates significant difficulty performing correctly. Progressed HEP and education to patient and wife. Pt will benefit from PT services to address deficits in strength, balance, and mobility in order to return to full function at home.                         PT Short Term Goals - 11/10/19 1559      PT SHORT TERM GOAL  #1   Title  Pt will be independent with HEP in order to improve strength and balance in order to decrease fall risk and improve function at home.    Time  6    Period  Weeks    Status  New    Target Date  12/22/19        PT Long Term Goals - 11/10/19 1600      PT LONG TERM GOAL #1   Title  Pt will improve BERG by at least 3 points in order to demonstrate clinically significant improvement in balance.    Baseline  11/10/19: 51/56    Time  12    Period  Weeks    Status  New    Target Date  02/02/20      PT LONG TERM GOAL #2   Title  Pt will improve ABC by at least 13% in order to demonstrate clinically significant improvement in balance confidence.    Baseline  11/10/19: 43.125%    Time  12    Period  Weeks    Status  New    Target Date  02/02/20      PT LONG TERM GOAL #3   Title  Pt will improve FOTO to at least 68 in order to demonstrate significant improvement in function at home.    Baseline  11/10/19: 58    Time  12    Period  Weeks    Status  New    Target Date  02/02/20            Plan - 11/17/19 1050    Clinical Impression Statement  Pt demonstrates excellent motivation during session today. Continued with leg press and increased to two reps at 55#. He is lacking considerable L ankle dorsiflexion range of motion which is contributing to his continued difficulty with L foot drag during gait. Pt provided L ankle dorsiflexion stretch however he demonstrates significant difficulty performing correctly. Progressed HEP and education to patient and wife. Pt will benefit from PT services to address deficits in strength, balance, and mobility in order to return to full function at home.    Personal Factors and Comorbidities  Age;Comorbidity 2    Comorbidities  CVA, vascular dementia    Examination-Activity Limitations  Locomotion Level;Transfers    Examination-Participation Restrictions  Community Activity;Shop    Stability/Clinical Decision Making  Evolving/Moderate  complexity    Rehab Potential  Fair    PT Frequency  2x / week    PT Duration  12 weeks    PT Treatment/Interventions  ADLs/Self Care Home Management;Aquatic Therapy;Biofeedback;Cryotherapy;Canalith Repostioning;Electrical Stimulation;Iontophoresis 4mg /ml Dexamethasone;Moist Heat;Traction;Ultrasound;DME Instruction;Gait training;Stair training;Functional mobility training;Therapeutic activities;Therapeutic exercise;Balance training;Neuromuscular re-education;Cognitive remediation;Patient/family education;Manual techniques;Passive range  of motion;Dry needling;Vestibular    PT Next Visit Plan  Review HEP, consider 6MWT    PT Home Exercise Plan  Medbridge Access Code: [email or web address] (clams, marches, sit to stand green tband, tandem balance, gastroc stretch);    Consulted and Agree with Plan of Care  Patient;Family member/caregiver    Family Member Consulted  Wife       Patient will benefit from skilled therapeutic intervention in order to improve the following deficits and impairments:  Abnormal gait, Decreased balance, Decreased safety awareness, Decreased knowledge of precautions, Decreased cognition, Decreased mobility  Visit Diagnosis: Unsteadiness on feet  History of falling     Problem List Patient Active Problem List   Diagnosis Date Noted  . Stroke ([place]) 01/27/2017  . High cholesterol 01/27/2017  . Hypertension 01/27/2017  . Carotid stenosis 01/27/2017  . Occlusion and stenosis of carotid artery with cerebral infarction 07/27/2013   Lyndel Safe Arvine Clayburn PT, DPT, GCS  Kijana Estock 11/18/2019, 9:29 AM  [place] MAIN [facility] SERVICES [address] [place], [place], [address] Phone: [phone]   Fax:  [phone]  Name: Troy Pugh. MRN: [id number] Date of Birth: August 20, 1940

## 2019-11-22 ENCOUNTER — Ambulatory Visit: Payer: Medicare Other

## 2019-11-22 ENCOUNTER — Other Ambulatory Visit: Payer: Self-pay

## 2019-11-22 DIAGNOSIS — R2681 Unsteadiness on feet: Secondary | ICD-10-CM

## 2019-11-22 DIAGNOSIS — Z9181 History of falling: Secondary | ICD-10-CM

## 2019-11-22 NOTE — Therapy (Signed)
[place] MAIN [facility] SERVICES [address] [place], [place], [address] Phone: [phone]   Fax:  [phone]  Physical Therapy Treatment  Patient Details  Name: Troy Pugh. MRN: [id number] Date of Birth: Nov 28, 1940 Referring Provider (PT): Dr. Doy Hutching   Encounter Date: 11/22/2019  PT End of Session - 11/22/19 1355    Visit Number  4    Number of Visits  25    Date for PT Re-Evaluation  02/02/20    Authorization Type  eval: 11/10/19    PT Start Time  1345    PT Stop Time  1430    PT Time Calculation (min)  45 min    Equipment Utilized During Treatment  Gait belt    Activity Tolerance  Patient tolerated treatment well    Behavior During Therapy  WFL for tasks assessed/performed       Past Medical History:  Diagnosis Date  . 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 High cholesterol   . Hypertension   . Stroke [facility])     Past Surgical History:  Procedure Laterality Date  . CATARACT EXTRACTION    . GALLBLADDER SURGERY      There were no vitals filed for this visit.  Subjective Assessment - 11/22/19 1356    Subjective  Pt reports he is doing well today. Denies any falls since last therapy session. No pain reported currently. No specific questions or concerns upon arrival.    Pertinent History  Pt suffered a CVA 10 years ago. Due to recent worsening balance and falls his MD ordered PT. He started [facility] PT in January and had his last therapy session this Monday. Pt reports that he was working on balance and strengthening with therapy. He has been experiencing worsening L drop foot over the last year or so and now has an AFO which he picked up 2-3 weeks ago. Pt reports improvement in his gait since getting his AFO. He has had at least 6 falls in the last 12 months. He initially had PT at [facility] after his CVA but was doing relatively well until recently. He has a history of vascular dementia and started Aricept last night. History is felt to be  unreliable from patient and most information is provided by his wife.    Patient Stated Goals  Improve balance, gait, and decrease risk for falls    Currently in Pain?  No/denies          TREATMENT   Ther-ex  NuStep L4 x 4 minutes for warm-up during history (3 minutes unbilled); Seated exercises with 4# ankle weights (AW) x 20 BLE: Marches x 20 BLE; LAQ x 20 BLE;  Seated clams with manual resistance x 20 BLE; Seated adductor squeeze with manual resistance x 20 BLE;  Standing exercises with 4# ankle weights (AW) x 20 BLE: Hip flexion marches; Hip abduction; HS curls;  Quantum single leg press 70# 2 x 20 BLE; Matrix resisted gait 7.5# forward/backward/R lateral/L lateral x 2 each direction; 6" alternating step taps alternating LE without UE support x 10 BLE; 6" alternating step ups alternating LE without UE support x 10 BLE;   Pt educated throughout session about proper posture and technique with exercises. Improved exercise technique, movement at target joints, use of target muscles after min to mod verbal, visual, tactile cues.    Pt demonstrates excellent motivation during session today. Continued with leg press and increased weight today. Initiated resisted gait with patient today. He demonstrates  continued difficulty with L foot drag during gait and it is slightly worse today. Pt requires CGA to minA+1 throughout entire session for balance and safety. Pt encouraged to continue HEP and follow-up as scheduled. Pt will benefit from PT services to address deficits in strength, balance, and mobility in order to return to full function at home.                          PT Short Term Goals - 11/10/19 1559      PT SHORT TERM GOAL #1   Title  Pt will be independent with HEP in order to improve strength and balance in order to decrease fall risk and improve function at home.    Time  6    Period  Weeks    Status  New    Target Date  12/22/19         PT Long Term Goals - 11/10/19 1600      PT LONG TERM GOAL #1   Title  Pt will improve BERG by at least 3 points in order to demonstrate clinically significant improvement in balance.    Baseline  11/10/19: 51/56    Time  12    Period  Weeks    Status  New    Target Date  02/02/20      PT LONG TERM GOAL #2   Title  Pt will improve ABC by at least 13% in order to demonstrate clinically significant improvement in balance confidence.    Baseline  11/10/19: 43.125%    Time  12    Period  Weeks    Status  New    Target Date  02/02/20      PT LONG TERM GOAL #3   Title  Pt will improve FOTO to at least 68 in order to demonstrate significant improvement in function at home.    Baseline  11/10/19: 58    Time  12    Period  Weeks    Status  New    Target Date  02/02/20            Plan - 11/23/19 0826    Clinical Impression Statement  Pt demonstrates excellent motivation during session today. Continued with leg press and increased weight today. Initiated resisted gait with patient today. He demonstrates continued difficulty with L foot drag during gait and it is slightly worse today. Pt requires CGA to minA+1 throughout entire session for balance and safety. Pt encouraged to continue HEP and follow-up as scheduled. Pt will benefit from PT services to address deficits in strength, balance, and mobility in order to return to full function at home.    Personal Factors and Comorbidities  Age;Comorbidity 2    Comorbidities  CVA, vascular dementia    Examination-Activity Limitations  Locomotion Level;Transfers    Examination-Participation Restrictions  Community Activity;Shop    Stability/Clinical Decision Making  Evolving/Moderate complexity    Rehab Potential  Fair    PT Frequency  2x / week    PT Duration  12 weeks    PT Treatment/Interventions  ADLs/Self Care Home Management;Aquatic Therapy;Biofeedback;Cryotherapy;Canalith Repostioning;Electrical Stimulation;Iontophoresis 4mg /ml  Dexamethasone;Moist Heat;Traction;Ultrasound;DME Instruction;Gait training;Stair training;Functional mobility training;Therapeutic activities;Therapeutic exercise;Balance training;Neuromuscular re-education;Cognitive remediation;Patient/family education;Manual techniques;Passive range of motion;Dry needling;Vestibular    PT Next Visit Plan  Review HEP, consider 6MWT    PT Home Exercise Plan  Medbridge Access Code: [email or web address] (clams, marches, sit to stand green tband, tandem balance, gastroc stretch);  Consulted and Agree with Plan of Care  Patient;Family member/caregiver    Family Member Consulted  Wife       Patient will benefit from skilled therapeutic intervention in order to improve the following deficits and impairments:  Abnormal gait, Decreased balance, Decreased safety awareness, Decreased knowledge of precautions, Decreased cognition, Decreased mobility  Visit Diagnosis: Unsteadiness on feet  History of falling     Problem List Patient Active Problem List   Diagnosis Date Noted  . Stroke ([place]) 01/27/2017  . High cholesterol 01/27/2017  . Hypertension 01/27/2017  . Carotid stenosis 01/27/2017  . Occlusion and stenosis of carotid artery with cerebral infarction 07/27/2013   Lyndel Safe Demetrio Leighty PT, DPT, GCS  Zelene Barga 11/23/2019, 9:12 AM  [place] MAIN [facility] SERVICES [address] [place], [place], [address] Phone: [phone]   Fax:  [phone]  Name: Troy Pugh. MRN: [id number] Date of Birth: [birth year]

## 2019-11-24 ENCOUNTER — Other Ambulatory Visit: Payer: Self-pay

## 2019-11-24 ENCOUNTER — Ambulatory Visit: Payer: Medicare Other

## 2019-11-24 DIAGNOSIS — Z9181 History of falling: Secondary | ICD-10-CM

## 2019-11-24 DIAGNOSIS — R2681 Unsteadiness on feet: Secondary | ICD-10-CM | POA: Diagnosis not present

## 2019-11-24 NOTE — Therapy (Signed)
[place] MAIN [facility] SERVICES [address] [place], [place], [address] Phone: [phone]   Fax:  [phone]  Physical Therapy Treatment  Patient Details  Name: Troy Pugh. MRN: [id number] Date of Birth: 1940/07/07 Referring Provider (PT): Dr. Doy Hutching   Encounter Date: 11/24/2019  PT End of Session - 11/24/19 1414    Visit Number  5    Number of Visits  25    Date for PT Re-Evaluation  02/02/20    Authorization Type  eval: 11/10/19    PT Start Time  1101    PT Stop Time  1146    PT Time Calculation (min)  45 min    Equipment Utilized During Treatment  Gait belt    Activity Tolerance  Patient tolerated treatment well    Behavior During Therapy  WFL for tasks assessed/performed       Past Medical History:  Diagnosis Date  . Carotid stent occlusion ([place])   . High cholesterol   . Hypertension   . Stroke [facility])     Past Surgical History:  Procedure Laterality Date  . CATARACT EXTRACTION    . GALLBLADDER SURGERY      There were no vitals filed for this visit.  Subjective Assessment - 11/24/19 1105    Subjective  Patient reports no falls or LOB since last session. Has been somewhat compliant with HEP.    Pertinent History  Pt suffered a CVA 10 years ago. Due to recent worsening balance and falls his MD ordered PT. He started [facility] PT in January and had his last therapy session this Monday. Pt reports that he was working on balance and strengthening with therapy. He has been experiencing worsening L drop foot over the last year or so and now has an AFO which he picked up 2-3 weeks ago. Pt reports improvement in his gait since getting his AFO. He has had at least 6 falls in the last 12 months. He initially had PT at [facility] after his CVA but was doing relatively well until recently. He has a history of vascular dementia and started Aricept last night. History is felt to be unreliable from patient and most information is provided by his wife.     Patient Stated Goals  Improve balance, gait, and decrease risk for falls    Currently in Pain?  No/denies           TREATMENT  Ther-ex  NuStep L4 x 4 minutes for warm-up during history RPM>80 for cardiovascular support  3lb ankle weight in // bars:  -High knee marching 4x length of // bars, cueing for high knees  -Static high knee march to PT hand for visual cue of arc of motion 12x each LE  -Side step 4x length of // bars, SUE support, cueing for increased exaggeration of movement with "stomp".  -hip extension 15x each LE ; BUE support   Neuro Re-ed:  Squat down pick up ball from bin on 6" step stand up throw ball into hoop, 10x w single UE support, 10x w/o UE support  One foot on airex pad one foot on 6" step for modified tandem stance; decreasing UE support 2x 45 seconds each foot placement.  airex pad: 6" step:airex pad: lateral step up/down with UE support, cues for foot placement for optimal positioning 10x each direction Speed ladder: one foot in each square x 6 lengths of // bars. SUE support, cueing for foot placement with improved coordination with repetition.  Pt educated throughout session about proper posture and technique with exercises. Improved exercise technique, movement at target joints, use of target muscles after min to mod verbal, visual, tactile cues.                         PT Education - 11/24/19 1414    Education Details  exercise technique, body mechanics    Person(s) Educated  Patient    Methods  Explanation;Demonstration;Tactile cues;Verbal cues    Comprehension  Verbalized understanding;Returned demonstration;Verbal cues required;Tactile cues required       PT Short Term Goals - 11/10/19 1559      PT SHORT TERM GOAL #1   Title  Pt will be independent with HEP in order to improve strength and balance in order to decrease fall risk and improve function at home.    Time  6    Period  Weeks    Status  New    Target Date   12/22/19        PT Long Term Goals - 11/10/19 1600      PT LONG TERM GOAL #1   Title  Pt will improve BERG by at least 3 points in order to demonstrate clinically significant improvement in balance.    Baseline  11/10/19: 51/56    Time  12    Period  Weeks    Status  New    Target Date  02/02/20      PT LONG TERM GOAL #2   Title  Pt will improve ABC by at least 13% in order to demonstrate clinically significant improvement in balance confidence.    Baseline  11/10/19: 43.125%    Time  12    Period  Weeks    Status  New    Target Date  02/02/20      PT LONG TERM GOAL #3   Title  Pt will improve FOTO to at least 68 in order to demonstrate significant improvement in function at home.    Baseline  11/10/19: 58    Time  12    Period  Weeks    Status  New    Target Date  02/02/20            Plan - 11/24/19 1416    Clinical Impression Statement  Patient initially is challenged with heel strike of affected limb however demonstrated improved coordination and sequencing with visual cue of placement of extremity. He remains highly motivated throughout session. Unstable surfaces are challenging for the patient to stabilize self on with forward trunk lean requiring tactile and visual/verbal cues for upright posture in mirror. Pt will benefit from PT services to address deficits in strength, balance, and mobility in order to return to full function at home.    Personal Factors and Comorbidities  Age;Comorbidity 2    Comorbidities  CVA, vascular dementia    Examination-Activity Limitations  Locomotion Level;Transfers    Examination-Participation Restrictions  Community Activity;Shop    Stability/Clinical Decision Making  Evolving/Moderate complexity    Rehab Potential  Fair    PT Frequency  2x / week    PT Duration  12 weeks    PT Treatment/Interventions  ADLs/Self Care Home Management;Aquatic Therapy;Biofeedback;Cryotherapy;Canalith Repostioning;Electrical Stimulation;Iontophoresis  4mg /ml Dexamethasone;Moist Heat;Traction;Ultrasound;DME Instruction;Gait training;Stair training;Functional mobility training;Therapeutic activities;Therapeutic exercise;Balance training;Neuromuscular re-education;Cognitive remediation;Patient/family education;Manual techniques;Passive range of motion;Dry needling;Vestibular    PT Next Visit Plan  Review HEP, consider 6MWT    PT Home Exercise Plan  Medbridge Access  Code: [email or web address] (clams, marches, sit to stand green tband, tandem balance, gastroc stretch);    Consulted and Agree with Plan of Care  Patient;Family member/caregiver    Family Member Consulted  Wife       Patient will benefit from skilled therapeutic intervention in order to improve the following deficits and impairments:  Abnormal gait, Decreased balance, Decreased safety awareness, Decreased knowledge of precautions, Decreased cognition, Decreased mobility  Visit Diagnosis: Unsteadiness on feet  History of falling     Problem List Patient Active Problem List   Diagnosis Date Noted  . Stroke ([place]) 01/27/2017  . High cholesterol 01/27/2017  . Hypertension 01/27/2017  . Carotid stenosis 01/27/2017  . Occlusion and stenosis of carotid artery with cerebral infarction 07/27/2013   Janna Arch, PT, DPT   11/24/2019, 2:17 PM  [place] MAIN [facility] SERVICES [address] [place], [place], [address] Phone: [phone]   Fax:  [phone]  Name: Hoyte Voisine. MRN: [id number] Date of Birth: August 28, 1940

## 2019-11-30 ENCOUNTER — Ambulatory Visit: Payer: Medicare Other | Attending: Internal Medicine

## 2019-11-30 ENCOUNTER — Other Ambulatory Visit: Payer: Self-pay

## 2019-11-30 DIAGNOSIS — R2681 Unsteadiness on feet: Secondary | ICD-10-CM

## 2019-11-30 DIAGNOSIS — Z9181 History of falling: Secondary | ICD-10-CM | POA: Diagnosis present

## 2019-11-30 NOTE — Therapy (Signed)
[place] MAIN [facility] SERVICES [address] [place], [place], [address] Phone: [phone]   Fax:  [phone]  Physical Therapy Treatment  Patient Details  Name: Troy Pugh. MRN: [id number] Date of Birth: January 09, 1941 Referring Provider (PT): Dr. Doy Hutching   Encounter Date: 11/30/2019  PT End of Session - 11/30/19 1417    Visit Number  6    Number of Visits  25    Date for PT Re-Evaluation  02/02/20    Authorization Type  eval: 11/10/19    PT Start Time  1430    PT Stop Time  1515    PT Time Calculation (min)  45 min    Equipment Utilized During Treatment  Gait belt    Activity Tolerance  Patient tolerated treatment well    Behavior During Therapy  WFL for tasks assessed/performed       Past Medical History:  Diagnosis Date  . Carotid stent occlusion ([place])   . High cholesterol   . Hypertension   . Stroke [facility])     Past Surgical History:  Procedure Laterality Date  . CATARACT EXTRACTION    . GALLBLADDER SURGERY      There were no vitals filed for this visit.  Subjective Assessment - 11/30/19 1417    Subjective  Patient reports no falls or LOB since last session. No pain reported upon arrival. No specific questions or concerns currently.    Pertinent History  Pt suffered a CVA 10 years ago. Due to recent worsening balance and falls his MD ordered PT. He started [facility] PT in January and had his last therapy session this Monday. Pt reports that he was working on balance and strengthening with therapy. He has been experiencing worsening L drop foot over the last year or so and now has an AFO which he picked up 2-3 weeks ago. Pt reports improvement in his gait since getting his AFO. He has had at least 6 falls in the last 12 months. He initially had PT at [facility] after his CVA but was doing relatively well until recently. He has a history of vascular dementia and started Aricept last night. History is felt to be unreliable from patient and most  information is provided by his wife.    Patient Stated Goals  Improve balance, gait, and decrease risk for falls    Currently in Pain?  No/denies           TREATMENT   Ther-ex  NuStep L4 x 4 minutes for warm-up during history (3 minutes unbilled); Seated exercises with 4# ankle weights (AW) x 20 BLE: Marches x 20 BLE; LAQ x 20 BLE;  Seated clams with manual resistance x 20 BLE; Seated adductor squeeze with manual resistance x 20 BLE;  Standing exercises with 4# ankle weights (AW) x 20 BLE: Hip flexion marches; Hip abduction; HS curls;  Sit to stand from regular height chair with RLE on dynadisc to challenge LLE x 10;   Neuromuscular Re-education  Head/eye turns to pick up ball from bin on rolling cart and throw ball into hoop while standing with feet together on Airex pad; Staggered stance balance with rear foot on Airex pad and front foot on dynadisc without UE support alternating x 30s each; Airex stepping stone taps with therapist calling out laterality and color x multiple bouts; Speed ladder: one foot in each square x 6 lengths of // bars. SUE support, cueing for foot placement with improved coordination with repetition.  Pt educated throughout session about proper posture and technique with exercises. Improved exercise technique, movement at target joints, use of target muscles after min to mod verbal, visual, tactile cues.    Pt demonstrates excellent motivation during session today. Continued with LE strengthening in sitting and standing with focus on LLE as it is the weaker side. Pt requires CGA to minA+1 throughout entire session for balance and safety. He is also able to continue with head turns with dynamic ball tosses as well as speed ladder stepping. Pt encouraged to continue HEP and follow-up as scheduled. Pt will benefit from PT services to address deficits in strength, balance, and mobility in order to return to full function at home.                        PT Short Term Goals - 11/10/19 1559      PT SHORT TERM GOAL #1   Title  Pt will be independent with HEP in order to improve strength and balance in order to decrease fall risk and improve function at home.    Time  6    Period  Weeks    Status  New    Target Date  12/22/19        PT Long Term Goals - 11/10/19 1600      PT LONG TERM GOAL #1   Title  Pt will improve BERG by at least 3 points in order to demonstrate clinically significant improvement in balance.    Baseline  11/10/19: 51/56    Time  12    Period  Weeks    Status  New    Target Date  02/02/20      PT LONG TERM GOAL #2   Title  Pt will improve ABC by at least 13% in order to demonstrate clinically significant improvement in balance confidence.    Baseline  11/10/19: 43.125%    Time  12    Period  Weeks    Status  New    Target Date  02/02/20      PT LONG TERM GOAL #3   Title  Pt will improve FOTO to at least 68 in order to demonstrate significant improvement in function at home.    Baseline  11/10/19: 58    Time  12    Period  Weeks    Status  New    Target Date  02/02/20            Plan - 11/30/19 1417    Clinical Impression Statement  Pt demonstrates excellent motivation during session today. Continued with LE strengthening in sitting and standing with focus on LLE as it is the weaker side. Pt requires CGA to minA+1 throughout entire session for balance and safety. He is also able to continue with head turns with dynamic ball tosses as well as speed ladder stepping. Pt encouraged to continue HEP and follow-up as scheduled. Pt will benefit from PT services to address deficits in strength, balance, and mobility in order to return to full function at home.    Personal Factors and Comorbidities  Age;Comorbidity 2    Comorbidities  CVA, vascular dementia    Examination-Activity Limitations  Locomotion Level;Transfers    Examination-Participation Restrictions   Community Activity;Shop    Stability/Clinical Decision Making  Evolving/Moderate complexity    Rehab Potential  Fair    PT Frequency  2x / week    PT Duration  12 weeks  PT Treatment/Interventions  ADLs/Self Care Home Management;Aquatic Therapy;Biofeedback;Cryotherapy;Canalith Repostioning;Electrical Stimulation;Iontophoresis 4mg /ml Dexamethasone;Moist Heat;Traction;Ultrasound;DME Instruction;Gait training;Stair training;Functional mobility training;Therapeutic activities;Therapeutic exercise;Balance training;Neuromuscular re-education;Cognitive remediation;Patient/family education;Manual techniques;Passive range of motion;Dry needling;Vestibular    PT Next Visit Plan  Review HEP, continue with strength and balance    PT Home Exercise Plan  Medbridge Access Code: [email or web address] (clams, marches, sit to stand green tband, tandem balance, gastroc stretch);    Consulted and Agree with Plan of Care  Patient;Family member/caregiver    Family Member Consulted  Wife       Patient will benefit from skilled therapeutic intervention in order to improve the following deficits and impairments:  Abnormal gait, Decreased balance, Decreased safety awareness, Decreased knowledge of precautions, Decreased cognition, Decreased mobility  Visit Diagnosis: Unsteadiness on feet  History of falling     Problem List Patient Active Problem List   Diagnosis Date Noted  . Stroke ([place]) 01/27/2017  . High cholesterol 01/27/2017  . Hypertension 01/27/2017  . Carotid stenosis 01/27/2017  . Occlusion and stenosis of carotid artery with cerebral infarction 07/27/2013    Troy Pugh PT, DPT, GCS  Troy Pugh 12/01/2019, 10:55 AM  [place] MAIN [facility] SERVICES [address] [place], [place], [address] Phone: [phone]   Fax:  [phone]  Name: Troy Pugh. MRN: [id number] Date of Birth: 04-10-1941

## 2019-12-05 ENCOUNTER — Ambulatory Visit: Payer: Medicare Other | Admitting: Physical Therapy

## 2019-12-05 ENCOUNTER — Encounter: Payer: Self-pay | Admitting: Physical Therapy

## 2019-12-05 ENCOUNTER — Other Ambulatory Visit: Payer: Self-pay

## 2019-12-05 DIAGNOSIS — R2681 Unsteadiness on feet: Secondary | ICD-10-CM

## 2019-12-05 DIAGNOSIS — Z9181 History of falling: Secondary | ICD-10-CM

## 2019-12-05 NOTE — Therapy (Signed)
[place] MAIN [facility] SERVICES [address] [place], [place], [address] Phone: [phone]   Fax:  [phone]  Physical Therapy Treatment  Patient Details  Name: Troy Pugh. MRN: [id number] Date of Birth: 04-03-1941 Referring Provider (PT): Dr. Doy Hutching   Encounter Date: 12/05/2019  PT End of Session - 12/05/19 1112    Visit Number  7    Number of Visits  25    Date for PT Re-Evaluation  02/02/20    Authorization Type  eval: 11/10/19    PT Start Time  1105    PT Stop Time  1145    PT Time Calculation (min)  40 min    Equipment Utilized During Treatment  Gait belt    Activity Tolerance  Patient tolerated treatment well    Behavior During Therapy  WFL for tasks assessed/performed       Past Medical History:  Diagnosis Date  . Carotid stent occlusion ([place])   . High cholesterol   . Hypertension   . Stroke [facility])     Past Surgical History:  Procedure Laterality Date  . CATARACT EXTRACTION    . GALLBLADDER SURGERY      There were no vitals filed for this visit.  Subjective Assessment - 12/05/19 1111    Subjective  Patient reports doing well; No falls or recent loss of balance; Reports adherence with HEP; Reports having a hard time this weekend with the hot weather;    Pertinent History  Pt suffered a CVA 10 years ago. Due to recent worsening balance and falls his MD ordered PT. He started [facility] PT in January and had his last therapy session this Monday. Pt reports that he was working on balance and strengthening with therapy. He has been experiencing worsening L drop foot over the last year or so and now has an AFO which he picked up 2-3 weeks ago. Pt reports improvement in his gait since getting his AFO. He has had at least 6 falls in the last 12 months. He initially had PT at [facility] after his CVA but was doing relatively well until recently. He has a history of vascular dementia and started Aricept last night. History is felt to be  unreliable from patient and most information is provided by his wife.    Patient Stated Goals  Improve balance, gait, and decrease risk for falls    Currently in Pain?  No/denies    Multiple Pain Sites  No         TREATMENT  Ther-ex NuStep L4 x 4 minutes for warm-up during history (3 minutes unbilled); Seated exercises with 4# ankle weights (AW) x 20 BLE: LAQ x 20 BLE, required mod VCS to slow down LE movement and isolate knee extension with ankle DF. Patient had significant difficulty with LLE due to weakness;    Standing exercises with 4# ankle weights (AW) x 20 BLE: Hip flexion marches; Hip abduction; HS curls; Required BUE rail assist for safety; He also required mod VCs for proper positioning and exercise technique for optimal muscle activation.    Neuromuscular Re-education  Standing on airex pad: -alternate toe tap to 6 inch step with 1 rail assist x5 reps each; Progressed to alternate heel strike on step x10-15 reps with 1-0 rail assist with min A for safety;  Tandem stance on airex pad:   Unsupported standing head turns side/side x5 reps each with min A and cues to slow down head turns for better stance control;  Feet together:   Diona Foley toss up/down x10 reps with cues for high toss to challenge erect posture  Forward ball toss/catch x10 reps unsupported with close supervision;   Speed ladder: one foot in each square x 6 lengths of // bars. 1-0 rail support, cueing for foot placement and increase ankle DF for better foot clearance with improved coordination with repetition.  Side stepping in speed ladder x3 laps each direction with 1-0 rail assist and cues for erect posture to challenge dynamic balance;    Pt educated throughout session about proper posture and technique with exercises. Improved exercise technique, movement at target joints, use of target muscles after min to mod verbal, visual, tactile cues.   Pt demonstrates excellent motivation during session  today. Continued with LE strengthening in sitting and standing with focus on LLE as it is the weaker side. Pt requires CGA to minA+1 throughout entire session for balance and safety. He requires frequent cues for erect posture to challenge stance control and trunk control. He also required increased cues for better heel strike for better gait safety; Patient very unstable at end of session requiring CGA to close supervision when walking out of facility;                      PT Education - 12/05/19 1112    Education Details  exercise technique, body mechanics, HEP    Person(s) Educated  Patient    Methods  Explanation;Verbal cues    Comprehension  Verbalized understanding;Returned demonstration;Verbal cues required;Need further instruction       PT Short Term Goals - 11/10/19 1559      PT SHORT TERM GOAL #1   Title  Pt will be independent with HEP in order to improve strength and balance in order to decrease fall risk and improve function at home.    Time  6    Period  Weeks    Status  New    Target Date  12/22/19        PT Long Term Goals - 11/10/19 1600      PT LONG TERM GOAL #1   Title  Pt will improve BERG by at least 3 points in order to demonstrate clinically significant improvement in balance.    Baseline  11/10/19: 51/56    Time  12    Period  Weeks    Status  New    Target Date  02/02/20      PT LONG TERM GOAL #2   Title  Pt will improve ABC by at least 13% in order to demonstrate clinically significant improvement in balance confidence.    Baseline  11/10/19: 43.125%    Time  12    Period  Weeks    Status  New    Target Date  02/02/20      PT LONG TERM GOAL #3   Title  Pt will improve FOTO to at least 68 in order to demonstrate significant improvement in function at home.    Baseline  11/10/19: 58    Time  12    Period  Weeks    Status  New    Target Date  02/02/20            Plan - 12/06/19 0920    Clinical Impression Statement   Patient motivated and participated fair within session. Initially patient exhibits heavy toe strike at initial contact with decreased foot clearance and heavy heel off during terminal stance to facilitate  better swing. Patient instructed in advanced balance exercise to challenge foot clearance/heel strike. Patient was able to exhibit good DF with verbal/visual cues. However with increased repetition he reverts back to heavy toe strike. Patient required CGA to close supervision when walking outside of gym due to increased instability. He would benefit from additional skilled PT intervention to improve strength, balance and gait safety;    Personal Factors and Comorbidities  Age;Comorbidity 2    Comorbidities  CVA, vascular dementia    Examination-Activity Limitations  Locomotion Level;Transfers    Examination-Participation Restrictions  Community Activity;Shop    Stability/Clinical Decision Making  Evolving/Moderate complexity    Rehab Potential  Fair    PT Frequency  2x / week    PT Duration  12 weeks    PT Treatment/Interventions  ADLs/Self Care Home Management;Aquatic Therapy;Biofeedback;Cryotherapy;Canalith Repostioning;Electrical Stimulation;Iontophoresis 4mg /ml Dexamethasone;Moist Heat;Traction;Ultrasound;DME Instruction;Gait training;Stair training;Functional mobility training;Therapeutic activities;Therapeutic exercise;Balance training;Neuromuscular re-education;Cognitive remediation;Patient/family education;Manual techniques;Passive range of motion;Dry needling;Vestibular    PT Next Visit Plan  Review HEP, continue with strength and balance    PT Home Exercise Plan  Medbridge Access Code: [email or web address] (clams, marches, sit to stand green tband, tandem balance, gastroc stretch);    Consulted and Agree with Plan of Care  Patient;Family member/caregiver    Family Member Consulted  Wife       Patient will benefit from skilled therapeutic intervention in order to improve the following deficits and  impairments:  Abnormal gait, Decreased balance, Decreased safety awareness, Decreased knowledge of precautions, Decreased cognition, Decreased mobility  Visit Diagnosis: Unsteadiness on feet  History of falling     Problem List Patient Active Problem List   Diagnosis Date Noted  . Stroke ([place]) 01/27/2017  . High cholesterol 01/27/2017  . Hypertension 01/27/2017  . Carotid stenosis 01/27/2017  . Occlusion and stenosis of carotid artery with cerebral infarction 07/27/2013    Regene Mccarthy PT, DPT 12/06/2019, 9:24 AM  [place] MAIN [facility] SERVICES [address] [place], [place], [address] Phone: [phone]   Fax:  [phone]  Name: Troy Pugh. MRN: [id number] Date of Birth: 03/03/1941

## 2019-12-07 ENCOUNTER — Other Ambulatory Visit: Payer: Self-pay

## 2019-12-07 ENCOUNTER — Ambulatory Visit: Payer: Medicare Other

## 2019-12-07 DIAGNOSIS — Z9181 History of falling: Secondary | ICD-10-CM

## 2019-12-07 DIAGNOSIS — R2681 Unsteadiness on feet: Secondary | ICD-10-CM

## 2019-12-07 NOTE — Therapy (Signed)
[place] MAIN [facility] SERVICES [address] [place], [place], [address] Phone: [phone]   Fax:  [phone]  Physical Therapy Treatment  Patient Details  Name: Troy Pugh. MRN: [id number] Date of Birth: 1940/08/28 Referring Provider (PT): Dr. Doy Hutching   Encounter Date: 12/07/2019  PT End of Session - 12/07/19 1421    Visit Number  8    Number of Visits  25    Date for PT Re-Evaluation  02/02/20    Authorization Type  eval: 11/10/19    PT Start Time  1430    PT Stop Time  1515    PT Time Calculation (min)  45 min    Equipment Utilized During Treatment  Gait belt    Activity Tolerance  Patient tolerated treatment well    Behavior During Therapy  WFL for tasks assessed/performed       Past Medical History:  Diagnosis Date  . Carotid stent occlusion ([place])   . High cholesterol   . Hypertension   . Stroke [facility])     Past Surgical History:  Procedure Laterality Date  . CATARACT EXTRACTION    . GALLBLADDER SURGERY      There were no vitals filed for this visit.  Subjective Assessment - 12/07/19 1420    Subjective  Patient reports doing well; No falls or recent loss of balance; Reports adherence with HEP; Reports having a hard time this weekend with the hot weather;    Pertinent History  Pt suffered a CVA 10 years ago. Due to recent worsening balance and falls his MD ordered PT. He started [facility] in January and had his last therapy session this Monday. Pt reports that he was working on balance and strengthening with therapy. He has been experiencing worsening L drop foot over the last year or so and now has an AFO which he picked up 2-3 weeks ago. Pt reports improvement in his gait since getting his AFO. He has had at least 6 falls in the last 12 months. He initially had PT at [facility] after his CVA but was doing relatively well until recently. He has a history of vascular dementia and started Aricept last night. History is felt to be  unreliable from patient and most information is provided by his wife.    Patient Stated Goals  Improve balance, gait, and decrease risk for falls    Currently in Pain?  No/denies           TREATMENT   Ther-ex NuStep L4 x 5 minutes for warm-up during history (3 minutes unbilled); Quantum single leg press 70# RLE: 2 x 20, LLE: x 20, x 16;  Seated exercises with 5# ankle weights (AW) x 20 BLE: Marches x 20 BLE; LAQ x 20 BLE; Seated clams with manual resistance x 20 BLE; Seated adductor squeeze with manual resistance x 20 BLE;  Standing exercises with 4# ankle weights (AW) x 20 BLE: Hip flexion marches; Hip abduction; HS curls;   Neuromuscular Re-education  Gait in hallway with single point cane in RUE, repeated cues and education about practicing knee extension prior to initial contact on the left and increasing L step length to improve heel strike. When provided dual tasks pt quickly reverts back to previous gait pattern, 75' x 4;  Gait in hallway with horizontal and vertical head turns on command x 75', mild lateral gait deviation, pt quickly reverts back to shuffling pattern when dual tasks added; Head/eye turns to pick up ball  from bin on rolling cart and throw ball into hoop while standing with feet together on Airex pad, multiple attempts;   Pt educated throughout session about proper posture and technique with exercises. Improved exercise technique, movement at target joints, use of target muscles after min to mod verbal, visual, tactile cues.    Pt demonstrates excellent motivation during session today. Continued with LE strengthening in sitting and standing with focus on LLE as it is the weaker side. Pt requires CGA to minA+1 throughout entire session for balance and safety. Gait in hallway with single point cane in RUE, repeated cues and education about practicing knee extension prior to initial contact on the left and increasing L step length to improve heel  strike. When provided dual tasks pt quickly reverts back to previous gait pattern. Pt unstable after session and therapist ambulated with pt and wife to elevator providing CGA for patient. Pt encouraged to continue HEP and follow-up as scheduled. Pt will benefit from PT services to address deficits in strength, balance, and mobility in order to return to full function at home.                         PT Short Term Goals - 11/10/19 1559      PT SHORT TERM GOAL #1   Title  Pt will be independent with HEP in order to improve strength and balance in order to decrease fall risk and improve function at home.    Time  6    Period  Weeks    Status  New    Target Date  12/22/19        PT Long Term Goals - 11/10/19 1600      PT LONG TERM GOAL #1   Title  Pt will improve BERG by at least 3 points in order to demonstrate clinically significant improvement in balance.    Baseline  11/10/19: 51/56    Time  12    Period  Weeks    Status  New    Target Date  02/02/20      PT LONG TERM GOAL #2   Title  Pt will improve ABC by at least 13% in order to demonstrate clinically significant improvement in balance confidence.    Baseline  11/10/19: 43.125%    Time  12    Period  Weeks    Status  New    Target Date  02/02/20      PT LONG TERM GOAL #3   Title  Pt will improve FOTO to at least 68 in order to demonstrate significant improvement in function at home.    Baseline  11/10/19: 58    Time  12    Period  Weeks    Status  New    Target Date  02/02/20            Plan - 12/07/19 1421    Clinical Impression Statement  Pt demonstrates excellent motivation during session today. Continued with LE strengthening in sitting and standing with focus on LLE as it is the weaker side. Pt requires CGA to minA+1 throughout entire session for balance and safety. Gait in hallway with single point cane in RUE, repeated cues and education about practicing knee extension prior to initial  contact on the left and increasing L step length to improve heel strike. When provided dual tasks pt quickly reverts back to previous gait pattern. Pt unstable after session and therapist ambulated with  pt and wife to elevator providing CGA for patient. Pt encouraged to continue HEP and follow-up as scheduled. Pt will benefit from PT services to address deficits in strength, balance, and mobility in order to return to full function at home.    Personal Factors and Comorbidities  Age;Comorbidity 2    Comorbidities  CVA, vascular dementia    Examination-Activity Limitations  Locomotion Level;Transfers    Examination-Participation Restrictions  Community Activity;Shop    Stability/Clinical Decision Making  Evolving/Moderate complexity    Rehab Potential  Fair    PT Frequency  2x / week    PT Duration  12 weeks    PT Treatment/Interventions  ADLs/Self Care Home Management;Aquatic Therapy;Biofeedback;Cryotherapy;Canalith Repostioning;Electrical Stimulation;Iontophoresis 4mg /ml Dexamethasone;Moist Heat;Traction;Ultrasound;DME Instruction;Gait training;Stair training;Functional mobility training;Therapeutic activities;Therapeutic exercise;Balance training;Neuromuscular re-education;Cognitive remediation;Patient/family education;Manual techniques;Passive range of motion;Dry needling;Vestibular    PT Next Visit Plan  Review HEP, continue with strength and balance    PT Home Exercise Plan  Medbridge Access Code: [email or web address] (clams, marches, sit to stand green tband, tandem balance, gastroc stretch);    Consulted and Agree with Plan of Care  Patient;Family member/caregiver    Family Member Consulted  Wife       Patient will benefit from skilled therapeutic intervention in order to improve the following deficits and impairments:  Abnormal gait, Decreased balance, Decreased safety awareness, Decreased knowledge of precautions, Decreased cognition, Decreased mobility  Visit Diagnosis: Unsteadiness on  feet  History of falling     Problem List Patient Active Problem List   Diagnosis Date Noted  . Stroke ([place]) 01/27/2017  . High cholesterol 01/27/2017  . Hypertension 01/27/2017  . Carotid stenosis 01/27/2017  . Occlusion and stenosis of carotid artery with cerebral infarction 07/27/2013   Lyndel Safe Akira Perusse PT, DPT, GCS  Saralynn Langhorst 12/07/2019, 3:46 PM  [place] MAIN [facility] SERVICES [address] [place], [place], [address] Phone: [phone]   Fax:  [phone]  Name: Reace Breshears. MRN: [id number] Date of Birth: 11-Aug-1940

## 2019-12-12 ENCOUNTER — Ambulatory Visit: Payer: Medicare Other | Admitting: Physical Therapy

## 2019-12-12 ENCOUNTER — Other Ambulatory Visit: Payer: Self-pay

## 2019-12-12 ENCOUNTER — Encounter: Payer: Self-pay | Admitting: Physical Therapy

## 2019-12-12 DIAGNOSIS — R2681 Unsteadiness on feet: Secondary | ICD-10-CM

## 2019-12-12 DIAGNOSIS — Z9181 History of falling: Secondary | ICD-10-CM

## 2019-12-12 NOTE — Therapy (Signed)
[place] MAIN [facility] SERVICES [address] [place], [place], [address] Phone: [phone]   Fax:  [phone]  Physical Therapy Treatment  Patient Details  Name: Troy Pugh. MRN: [id number] Date of Birth: 1940-10-28 Referring Provider (PT): Dr. Doy Hutching   Encounter Date: 12/12/2019   PT End of Session - 12/12/19 1102    Visit Number 9    Number of Visits 25    Date for PT Re-Evaluation 02/02/20    Authorization Type eval: 11/10/19    PT Start Time 1102    PT Stop Time 1145    PT Time Calculation (min) 43 min    Equipment Utilized During Treatment Gait belt    Activity Tolerance Patient tolerated treatment well    Behavior During Therapy WFL for tasks assessed/performed           Past Medical History:  Diagnosis Date  . Carotid stent occlusion ([place])   . High cholesterol   . Hypertension   . Stroke [facility])     Past Surgical History:  Procedure Laterality Date  . CATARACT EXTRACTION    . GALLBLADDER SURGERY      There were no vitals filed for this visit.   Subjective Assessment - 12/12/19 1105    Subjective Patient reports doing well; no new falls; reports no pain; No new complaints;    Pertinent History Pt suffered a CVA 10 years ago. Due to recent worsening balance and falls his MD ordered PT. He started [facility] PT in January and had his last therapy session this Monday. Pt reports that he was working on balance and strengthening with therapy. He has been experiencing worsening L drop foot over the last year or so and now has an AFO which he picked up 2-3 weeks ago. Pt reports improvement in his gait since getting his AFO. He has had at least 6 falls in the last 12 months. He initially had PT at [facility] after his CVA but was doing relatively well until recently. He has a history of vascular dementia and started Aricept last night. History is felt to be unreliable from patient and most information is provided by his wife.    Patient  Stated Goals Improve balance, gait, and decrease risk for falls    Currently in Pain? No/denies    Multiple Pain Sites No                TREATMENT  Ther-ex NuStep L4 x 4 minutes for warm-up during history (unbilled);  Leg press: BLE 70# x15 reps with cues for proper positioning and to slow down LE movement for better motor control; LLE only 70# 2x15 with mod VCS to slow down LE movement and increase AROM for better strengthening in LLE;   Seated ankle DF green tband x15 reps each LE  Standing with green tband around BLE: -hip abduction x15 reps; -hip extension x15 reps; -side stepping x10 feet x2 laps each direction; Patient required mod VCS to improve erect posture and increase AROM for better muscle activation/strengthening; Provided written HEP for better adherence;   Neuromuscular Re-education Standing on airex pad: -ball toss to basketball goal x15 total goals (approximately 25 attempts) with CGA for safety, feet apart; Required cues to slow down and improve erect posture prior to shooting for improved accuracy;   Outside parallel bars:  Speed ladder: one foot in each square x 6 lengths with and without SPC, with  Min A for safety; Patient able to exhibit good  reciprocal gait with good LLE knee flexion during swing and knee extension/heel strike at initial contact on LLE;   Inside parallel bars: Stepping over orange hurdle #2 x8 reps reciprocal stepping with 2-1-0 rail assist with cues for heel strike; Required CGA for safety and cues to increase step length for better foot clearance;  Following activity, patient ambulated in gym without SPC with CGA for safety often reverting back to heavy toe strike at initial contact with decreased step length on LLE; He does exhibit better foot clearance and gait ability with visual cues when stepping over obstacles;   Pt educated throughout session about proper posture and technique with exercises. Improved exercise  technique, movement at target joints, use of target muscles after min to mod verbal, visual, tactile cues.   Pt demonstrates excellent motivation during session today. Advanced HEP with LE strengthening, see patient instructions;  He was instructed in advanced balance tasks focusing on dynamic balance with stepping over obstacles. Patient is able to exhibit improved foot clearance and gait ability when he has obstacles to negotiate. However at end of session when walking out of gym he reverts back to short step length LLE with heavy toe strike at initial contact;                      PT Education - 12/12/19 1102    Education Details exercise technique, balance/strengthening, HEP    Person(s) Educated Patient    Methods Explanation;Verbal cues    Comprehension Verbalized understanding;Returned demonstration;Verbal cues required;Need further instruction            PT Short Term Goals - 11/10/19 1559      PT SHORT TERM GOAL #1   Title Pt will be independent with HEP in order to improve strength and balance in order to decrease fall risk and improve function at home.    Time 6    Period Weeks    Status New    Target Date 12/22/19             PT Long Term Goals - 11/10/19 1600      PT LONG TERM GOAL #1   Title Pt will improve BERG by at least 3 points in order to demonstrate clinically significant improvement in balance.    Baseline 11/10/19: 51/56    Time 12    Period Weeks    Status New    Target Date 02/02/20      PT LONG TERM GOAL #2   Title Pt will improve ABC by at least 13% in order to demonstrate clinically significant improvement in balance confidence.    Baseline 11/10/19: 43.125%    Time 12    Period Weeks    Status New    Target Date 02/02/20      PT LONG TERM GOAL #3   Title Pt will improve FOTO to at least 68 in order to demonstrate significant improvement in function at home.    Baseline 11/10/19: 58    Time 12    Period Weeks    Status  New    Target Date 02/02/20                 Plan - 12/12/19 1155    Clinical Impression Statement Patient motivated and participated well within session. He was instructed in advanced HEP with LE strengthening exercise, see patient instructions. Provided written HEP for better adherence. Patient continues to have increased weakness on LLE which limits gait and  mobility; Instructed patient in dynamic balance tasks with stepping over obstacles. When pt has a visual cue he is able to exhibit increased step length and heel strike with good reciprocal gait pattern. This is evident with ladder drills and stepping over hurdles. However with regular walking in open gym patient often reverts back to short shuffled steps with heavy toe strike on LLE with decreased foot clearance. patient would benefit from additional skilled PT intervention to improve strength, balance and mobility;    Personal Factors and Comorbidities Age;Comorbidity 2    Comorbidities CVA, vascular dementia    Examination-Activity Limitations Locomotion Level;Transfers    Examination-Participation Restrictions Community Activity;Shop    Stability/Clinical Decision Making Evolving/Moderate complexity    Rehab Potential Fair    PT Frequency 2x / week    PT Duration 12 weeks    PT Treatment/Interventions ADLs/Self Care Home Management;Aquatic Therapy;Biofeedback;Cryotherapy;Canalith Repostioning;Electrical Stimulation;Iontophoresis 4mg /ml Dexamethasone;Moist Heat;Traction;Ultrasound;DME Instruction;Gait training;Stair training;Functional mobility training;Therapeutic activities;Therapeutic exercise;Balance training;Neuromuscular re-education;Cognitive remediation;Patient/family education;Manual techniques;Passive range of motion;Dry needling;Vestibular    PT Next Visit Plan Review HEP, continue with strength and balance    PT Home Exercise Plan Medbridge Access Code: [email or web address] (clams, marches, sit to stand green tband, tandem balance,  gastroc stretch);    Consulted and Agree with Plan of Care Patient;Family member/caregiver    Family Member Consulted Wife           Patient will benefit from skilled therapeutic intervention in order to improve the following deficits and impairments:  Abnormal gait, Decreased balance, Decreased safety awareness, Decreased knowledge of precautions, Decreased cognition, Decreased mobility  Visit Diagnosis: Unsteadiness on feet  History of falling     Problem List Patient Active Problem List   Diagnosis Date Noted  . Stroke ([place]) 01/27/2017  . High cholesterol 01/27/2017  . Hypertension 01/27/2017  . Carotid stenosis 01/27/2017  . Occlusion and stenosis of carotid artery with cerebral infarction 07/27/2013    Insiya Oshea PT, DPT 12/12/2019, 12:01 PM  [place] MAIN [facility] SERVICES [address] [place], [place], [address] Phone: [phone]   Fax:  [phone]  Name: Hartwell Vandiver. MRN: [id number] Date of Birth: 11/06/1940

## 2019-12-12 NOTE — Patient Instructions (Signed)
Access Code: [email or web address] URL: [email or web address] Date: 12/12/2019 Prepared by: Blanche East  Exercises Seated Ankle Dorsiflexion with Resistance - 1 x daily - 3 x weekly - 2 sets - 15 reps Standing Hip Abduction with Resistance at Ankles and Counter Support - 1 x daily - 3 x weekly - 2 sets - 15 reps Standing Hip Extension with Resistance at Ankles and Counter Support - 1 x daily - 3 x weekly - 2 sets - 15 reps Side Stepping with Resistance at Ankles and Counter Support - 1 x daily - 3 x weekly - 2 sets - 10-15 reps

## 2019-12-14 ENCOUNTER — Other Ambulatory Visit: Payer: Self-pay

## 2019-12-14 ENCOUNTER — Ambulatory Visit: Payer: Medicare Other

## 2019-12-14 DIAGNOSIS — R2681 Unsteadiness on feet: Secondary | ICD-10-CM | POA: Diagnosis not present

## 2019-12-14 DIAGNOSIS — Z9181 History of falling: Secondary | ICD-10-CM

## 2019-12-14 NOTE — Therapy (Signed)
[place] MAIN [facility] SERVICES [place], [place], [address] Phone: [phone]   Fax:  [phone]  Physical Therapy Treatment Physical Therapy Progress Note   Dates of reporting period  11/10/19  to   12/14/19  Patient Details  Name: Troy Pugh. MRN: [id number] Date of Birth: [birth year] Referring Provider (PT): Dr. Doy Hutching   Encounter Date: 12/14/2019   PT End of Session - 12/14/19 1725    Visit Number 10    Number of Visits 25    Date for PT Re-Evaluation 02/02/20    Authorization Type next session 1/10 eval 6/16    PT Start Time 1430    PT Stop Time 1514    PT Time Calculation (min) 44 min    Equipment Utilized During Treatment Gait belt    Activity Tolerance Patient tolerated treatment well    Behavior During Therapy WFL for tasks assessed/performed           Past Medical History:  Diagnosis Date  . Carotid stent occlusion ([place])   . High cholesterol   . Hypertension   . Stroke [facility])     Past Surgical History:  Procedure Laterality Date  . CATARACT EXTRACTION    . GALLBLADDER SURGERY      There were no vitals filed for this visit.   Subjective Assessment - 12/14/19 1716    Subjective Patient presents with his wife. Reports no falls or LOB. Would like to practice walking with his cane more.    Pertinent History Pt suffered a CVA 10 years ago. Due to recent worsening balance and falls his MD ordered PT. He started [facility] PT in January and had his last therapy session this Monday. Pt reports that he was working on balance and strengthening with therapy. He has been experiencing worsening L drop foot over the last year or so and now has an AFO which he picked up 2-3 weeks ago. Pt reports improvement in his gait since getting his AFO. He has had at least 6 falls in the last 12 months. He initially had PT at [facility] after his CVA but was doing relatively well until recently. He has a history of vascular dementia and  started Aricept last night. History is felt to be unreliable from patient and most information is provided by his wife.    Patient Stated Goals Improve balance, gait, and decrease risk for falls    Currently in Pain? No/denies            Goals:   [facility] PT Assessment - 12/14/19 0001      Standardized Balance Assessment   Standardized Balance Assessment Berg Balance Test      Berg Balance Test   Sit to Stand Able to stand without using hands and stabilize independently    Standing Unsupported Able to stand safely 2 minutes    Sitting with Back Unsupported but Feet Supported on Floor or Stool Able to sit safely and securely 2 minutes    Stand to Sit Sits safely with minimal use of hands    Transfers Able to transfer safely, minor use of hands    Standing Unsupported with Eyes Closed Able to stand 10 seconds safely    Standing Unsupported with Feet Together Able to place feet together independently and stand 1 minute safely    From Standing, Reach Forward with Outstretched Arm Can reach confidently >25 cm (10")    From Standing Position, Pick up Object from Floor Able  to pick up shoe safely and easily    From Standing Position, Turn to Look Behind Over each Shoulder Looks behind from both sides and weight shifts well    Turn 360 Degrees Able to turn 360 degrees safely one side only in 4 seconds or less    Standing Unsupported, Alternately Place Feet on Step/Stool Able to stand independently and safely and complete 8 steps in 20 seconds    Standing Unsupported, One Foot in Front Able to plae foot ahead of the other independently and hold 30 seconds    Standing on One Leg Able to lift leg independently and hold 5-10 seconds    Total Score 53          BERG: 53/56  FOTO: 50    Treatment: Nustep Lvl 3-4 Rpm> 90 for cardiovascular challenge.  In // bars: Speed ladder: one foot in each square x 6 lengths of // bars. 1-0 rail support, cueing for foot placement and increase ankle DF for  better foot clearance with improved coordination with repetition.  Side stepping in speed ladder x3 laps each direction with 1-0 rail assist and cues for erect posture to challenge dynamic balance  airex pad: 6" step:airex pad: lateral step up/down with UE support, cues for foot placement for optimal positioning 10x each direction airex pad: reach for ball and throw at target 15x ; 2 sets no UE support Standing soccer ball kicks 20x each LE with BUE support for coordination, timing, sequencing.  Step over orange hurdle, heel strike hedgehog for visual cue and cue for arc of motion x 15 each LE  Ambulate 96 ft with SPC around gym with focus on heel strike, proper use of SPC, and close CGA. x3 trials; improved coordination with repetition   seated balloon taps reaching inside/outside BOS for coordination, sequencing, and reaction timing x 3 trials       Pt educated throughout session about proper posture and technique with exercises. Improved exercise technique, movement at target joints, use of target muscles after min to mod verbal, visual, tactile cues.    Patient's condition has the potential to improve in response to therapy. Maximum improvement is yet to be obtained. The anticipated improvement is attainable and reasonable in a generally predictable time.  Patient and patient's wife reports he is able to walk much better and is having less instability. Would like to practice walking with his cane.                      PT Education - 12/14/19 1717    Education Details exercise technique, body mechanics, goals.    Person(s) Educated Patient    Methods Explanation;Demonstration;Tactile cues;Verbal cues    Comprehension Verbalized understanding;Returned demonstration;Verbal cues required;Tactile cues required            PT Short Term Goals - 12/14/19 1727      PT SHORT TERM GOAL #1   Title Pt will be independent with HEP in order to improve strength and balance in order  to decrease fall risk and improve function at home.    Baseline 6/16: HEP compliant most days    Time 6    Period Weeks    Status Partially Met    Target Date 12/22/19             PT Long Term Goals - 12/14/19 1727      PT LONG TERM GOAL #1   Title Pt will improve BERG by at least  3 points in order to demonstrate clinically significant improvement in balance.    Baseline 11/10/19: 51/56 6/16: 53/56    Time 12    Period Weeks    Status Partially Met    Target Date 02/02/20      PT LONG TERM GOAL #2   Title Pt will improve ABC by at least 13% in order to demonstrate clinically significant improvement in balance confidence.    Baseline 11/10/19: 43.125% 6/16: 49%    Time 12    Period Weeks    Status Partially Met    Target Date 02/02/20      PT LONG TERM GOAL #3   Title Pt will improve FOTO to at least 68 in order to demonstrate significant improvement in function at home.    Baseline 11/10/19: 58 6/16: 50    Time 12    Period Weeks    Status On-going    Target Date 02/02/20                 Plan - 12/14/19 1731    Clinical Impression Statement Patient's balance is improving as noted with BERG score increased. FOTO score was decreased however patient and patient's wife verbalize and improvement in function. Instructed patient in dynamic balance tasks with stepping over obstacles. When pt has a visual cue he is able to exhibit increased step length and heel strike with good reciprocal gait pattern. This is evident with ladder drills and stepping over hurdles. Patient's condition has the potential to improve in response to therapy. Maximum improvement is yet to be obtained. The anticipated improvement is attainable and reasonable in a generally predictable time. patient would benefit from additional skilled PT intervention to improve strength, balance and mobility;    Personal Factors and Comorbidities Age;Comorbidity 2    Comorbidities CVA, vascular dementia     Examination-Activity Limitations Locomotion Level;Transfers    Examination-Participation Restrictions Community Activity;Shop    Stability/Clinical Decision Making Evolving/Moderate complexity    Rehab Potential Fair    PT Frequency 2x / week    PT Duration 12 weeks    PT Treatment/Interventions ADLs/Self Care Home Management;Aquatic Therapy;Biofeedback;Cryotherapy;Canalith Repostioning;Electrical Stimulation;Iontophoresis 70m/ml Dexamethasone;Moist Heat;Traction;Ultrasound;DME Instruction;Gait training;Stair training;Functional mobility training;Therapeutic activities;Therapeutic exercise;Balance training;Neuromuscular re-education;Cognitive remediation;Patient/family education;Manual techniques;Passive range of motion;Dry needling;Vestibular    PT Next Visit Plan Review HEP, continue with strength and balance    PT Home Exercise Plan Medbridge Access Code: [email or web address](clams, marches, sit to stand green tband, tandem balance, gastroc stretch);    Consulted and Agree with Plan of Care Patient;Family member/caregiver    Family Member Consulted Wife           Patient will benefit from skilled therapeutic intervention in order to improve the following deficits and impairments:  Abnormal gait, Decreased balance, Decreased safety awareness, Decreased knowledge of precautions, Decreased cognition, Decreased mobility  Visit Diagnosis: Unsteadiness on feet  History of falling     Problem List Patient Active Problem List   Diagnosis Date Noted  . St[place] ([place] 01/27/2017  . High cholesterol 01/27/2017  . Hypertension 01/27/2017  . Carotid stenosis 01/27/2017  . Occlusion and stenosis of carotid artery with cerebral infarction 07/27/2013   MJanna Arch PT, DPT   12/14/2019, 5:31 PM  [place] [facility] [address] [address]Phone: [phone]  Fax:  [phone] Name: HKiley Torrence MRN: [id number] of  Birth: [birth year]

## 2019-12-19 ENCOUNTER — Ambulatory Visit: Payer: Medicare Other

## 2019-12-22 ENCOUNTER — Ambulatory Visit: Payer: Medicare Other

## 2019-12-27 ENCOUNTER — Other Ambulatory Visit: Payer: Self-pay

## 2019-12-27 ENCOUNTER — Encounter: Payer: Self-pay | Admitting: Physical Therapy

## 2019-12-27 ENCOUNTER — Ambulatory Visit: Payer: Medicare Other | Admitting: Physical Therapy

## 2019-12-27 DIAGNOSIS — Z9181 History of falling: Secondary | ICD-10-CM

## 2019-12-27 DIAGNOSIS — R2681 Unsteadiness on feet: Secondary | ICD-10-CM

## 2019-12-27 NOTE — Therapy (Signed)
[place] MAIN [facility] SERVICES [address] [place], [place], [address] Phone: [phone]   Fax:  [phone]  Physical Therapy Treatment  Patient Details  Name: Troy Pugh. MRN: [id number] Date of Birth: [birth year] Referring Provider (PT): Dr. Doy Hutching   Encounter Date: 12/27/2019   PT End of Session - 12/27/19 1441    Visit Number 11    Number of Visits 25    Date for PT Re-Evaluation 02/02/20    Authorization Type next session 1/10 eval 6/16    PT Start Time 1431    PT Stop Time 1515    PT Time Calculation (min) 44 min    Equipment Utilized During Treatment Gait belt    Activity Tolerance Patient tolerated treatment well    Behavior During Therapy WFL for tasks assessed/performed           Past Medical History:  Diagnosis Date  . Carotid stent occlusion ([place])   . High cholesterol   . Hypertension   . Stroke [facility])     Past Surgical History:  Procedure Laterality Date  . CATARACT EXTRACTION    . GALLBLADDER SURGERY      There were no vitals filed for this visit.   Subjective Assessment - 12/27/19 1438    Subjective Patient reports experiencing a fall last week; his wife reports that he has been more agitated and confused; He has been more active and restless. He denies any pain;    Pertinent History Pt suffered a CVA 10 years ago. Due to recent worsening balance and falls his MD ordered PT. He started [facility] PT in January and had his last therapy session this Monday. Pt reports that he was working on balance and strengthening with therapy. He has been experiencing worsening L drop foot over the last year or so and now has an AFO which he picked up 2-3 weeks ago. Pt reports improvement in his gait since getting his AFO. He has had at least 6 falls in the last 12 months. He initially had PT at [facility] after his CVA but was doing relatively well until recently. He has a history of vascular dementia and started Aricept last night.  History is felt to be unreliable from patient and most information is provided by his wife.    Patient Stated Goals Improve balance, gait, and decrease risk for falls    Currently in Pain? No/denies    Multiple Pain Sites No               TREATMENT BP 113/61, HR 84, SPo2 100%  Ther-ex NuStep L4 x 4 minutes for warm-up during history (unbilled);  Leg press: BLE 85# x15 reps with cues for proper positioning and to slow down LE movement for better motor control; LLE only 55# 2x15 with mod VCS to slow down LE movement and increase AROM for better strengthening in LLE;    Neuromuscular Re-education  Outside parallel bars:  Speed ladder: one foot in each square x 8 lengths without SPC, with  Min A for safety; Patient continues to have increased left foot drag with decreased terminal knee extension at terminal swing. Pt also exhibit increased right heel off during mid/terminal stance to facilitate better LLE foot clearance;   Side stepping in parallel bars x1 lap each direction with min A for safety with mod Vcs for weight shift and to increase step length; He had increased difficulty with LLE hip adduction. Pt also exhibits increased LLE hip ER  during hip abduction when stepping to left to compensate for weakness in hip abductors;  Beside steps:  Stepping over orange hurdle #1 forward/backward x10 reps with 1 rail assist, Required min A for safety and cues to increase step length for better foot clearance;  Dual task: Taking step with LLE with heel strike and then tossing ball into basketball hoop x15 balls; Patient had difficulty with dual task, with variable stepping. Often standing still when throwing ball. He required min A for safety for dynamic balance;  Picking up balls #10 with min A for safety as patient would reach outside base of support impulsively trying to get ball rather than stepping close for better safety awareness;   Following activity, patient ambulated in  gym without SPC with CGA for safety often reverting back to heavy toe strike at initial contact with decreased step length on LLE; He does exhibit better foot clearance and gait ability with visual cues when stepping over obstacles;   Pt educated throughout session about proper posture and technique with exercises. Improved exercise technique, movement at target joints, use of target muscles after min to mod verbal, visual, tactile cues.   Caregiver provided with list of home aide services to assist with caregiving;                   PT Education - 12/27/19 1440    Education Details exercise technique, gait safety, positioning;    Person(s) Educated Patient    Methods Explanation;Verbal cues    Comprehension Verbalized understanding;Returned demonstration;Verbal cues required;Need further instruction            PT Short Term Goals - 12/14/19 1727      PT SHORT TERM GOAL #1   Title Pt will be independent with HEP in order to improve strength and balance in order to decrease fall risk and improve function at home.    Baseline 6/16: HEP compliant most days    Time 6    Period Weeks    Status Partially Met    Target Date 12/22/19             PT Long Term Goals - 12/14/19 1727      PT LONG TERM GOAL #1   Title Pt will improve BERG by at least 3 points in order to demonstrate clinically significant improvement in balance.    Baseline 11/10/19: 51/56 6/16: 53/56    Time 12    Period Weeks    Status Partially Met    Target Date 02/02/20      PT LONG TERM GOAL #2   Title Pt will improve ABC by at least 13% in order to demonstrate clinically significant improvement in balance confidence.    Baseline 11/10/19: 43.125% 6/16: 49%    Time 12    Period Weeks    Status Partially Met    Target Date 02/02/20      PT LONG TERM GOAL #3   Title Pt will improve FOTO to at least 68 in order to demonstrate significant improvement in function at home.    Baseline 11/10/19: 58  6/16: 50    Time 12    Period Weeks    Status On-going    Target Date 02/02/20                 Plan - 12/27/19 1521    Clinical Impression Statement Patient motivated and participated fair within session. He did have a fall last week and his wife reports  he has been more agitated and more combative. Patient does exhibit increased left toe drag and had a harder time picking LLE up with decreased LLE terminal knee extension. Patient does require min A for most activities for safety awareness. He continues to be impulsive and exhibits decreased safety awareness. Patient had a hard time with dual task often completing to just one task at a time. patient would benefit from additional skilled PT intervention to improve strength, balance and mobility; Recommend pt follow up with MD regarding new cognitive/behavoral changes. He is supposed to get labwork done next week;    Personal Factors and Comorbidities Age;Comorbidity 2    Comorbidities CVA, vascular dementia    Examination-Activity Limitations Locomotion Level;Transfers    Examination-Participation Restrictions Community Activity;Shop    Stability/Clinical Decision Making Evolving/Moderate complexity    Rehab Potential Fair    PT Frequency 2x / week    PT Duration 12 weeks    PT Treatment/Interventions ADLs/Self Care Home Management;Aquatic Therapy;Biofeedback;Cryotherapy;Canalith Repostioning;Electrical Stimulation;Iontophoresis 86m/ml Dexamethasone;Moist Heat;Traction;Ultrasound;DME Instruction;Gait training;Stair training;Functional mobility training;Therapeutic activities;Therapeutic exercise;Balance training;Neuromuscular re-education;Cognitive remediation;Patient/family education;Manual techniques;Passive range of motion;Dry needling;Vestibular    PT Next Visit Plan Review HEP, continue with strength and balance    PT Home Exercise Plan Medbridge Access Code: [email or web address](clams, marches, sit to stand green tband, tandem balance, gastroc  stretch);    Consulted and Agree with Plan of Care Patient;Family member/caregiver    Family Member Consulted Wife           Patient will benefit from skilled therapeutic intervention in order to improve the following deficits and impairments:  Abnormal gait, Decreased balance, Decreased safety awareness, Decreased knowledge of precautions, Decreased cognition, Decreased mobility  Visit Diagnosis: Unsteadiness on feet  History of falling     Problem List Patient Active Problem List   Diagnosis Date Noted  . Stroke ([place] 01/27/2017  . High cholesterol 01/27/2017  . Hypertension 01/27/2017  . Carotid stenosis 01/27/2017  . Occlusion and stenosis of carotid artery with cerebral infarction 07/27/2013    Elfreida Heggs PT, DPT 12/27/2019, 3:25 PM  [place]MAIN [facility]SERV[facility] [address] [address]Phone: [phone]  Fax:  [phone] Name: Troy Pugh MRN: [id number] of Birth: 1Feb 17, 1942

## 2020-01-03 ENCOUNTER — Other Ambulatory Visit: Payer: Self-pay

## 2020-01-03 ENCOUNTER — Ambulatory Visit: Payer: Medicare Other | Attending: Neurology | Admitting: Physical Therapy

## 2020-01-03 DIAGNOSIS — Z9181 History of falling: Secondary | ICD-10-CM | POA: Diagnosis present

## 2020-01-03 DIAGNOSIS — R2681 Unsteadiness on feet: Secondary | ICD-10-CM | POA: Diagnosis present

## 2020-01-03 DIAGNOSIS — R41841 Cognitive communication deficit: Secondary | ICD-10-CM | POA: Insufficient documentation

## 2020-01-03 DIAGNOSIS — R490 Dysphonia: Secondary | ICD-10-CM | POA: Diagnosis present

## 2020-01-03 NOTE — Therapy (Signed)
[place] MAIN [facility] SERVICES [address] [place], [place], [address] Phone: [phone]   Fax:  [phone]  Physical Therapy Treatment  Patient Details  Name: Troy Pugh. MRN: [id number] Date of Birth: Feb 11, 1941 Referring Provider (PT): Dr. Doy Hutching   Encounter Date: 01/03/2020   PT End of Session - 01/03/20 1308    Visit Number 12    Number of Visits 25    Date for PT Re-Evaluation 02/02/20    Authorization Type eval 6/16    Authorization Time Period FOTO- PT    PT Start Time 1302    PT Stop Time 1345    PT Time Calculation (min) 43 min    Equipment Utilized During Treatment Gait belt    Activity Tolerance Patient tolerated treatment well    Behavior During Therapy WFL for tasks assessed/performed           Past Medical History:  Diagnosis Date  . 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 High cholesterol   . Hypertension   . Stroke [facility])     Past Surgical History:  Procedure Laterality Date  . CATARACT EXTRACTION    . GALLBLADDER SURGERY      There were no vitals filed for this visit.   Subjective Assessment - 01/03/20 1307    Subjective Patient reports doing well; He denies any pain or soreness. Reports that he had a "noisy" 4th of July; His wife states that they follow up with PCP tomorrow;    Pertinent History Pt suffered a CVA 10 years ago. Due to recent worsening balance and falls his MD ordered PT. He started [facility] PT in January and had his last therapy session this Monday. Pt reports that he was working on balance and strengthening with therapy. He has been experiencing worsening L drop foot over the last year or so and now has an AFO which he picked up 2-3 weeks ago. Pt reports improvement in his gait since getting his AFO. He has had at least 6 falls in the last 12 months. He initially had PT at [facility] after his CVA but was doing relatively well until recently. He has a history of vascular dementia and started Aricept last  night. History is felt to be unreliable from patient and most information is provided by his wife.    Patient Stated Goals Improve balance, gait, and decrease risk for falls    Currently in Pain? No/denies    Multiple Pain Sites No                TREATMENT  Ther-ex Warm up on Crosstrainer level 2 x4 min (unbilled);  Sitting with 4# ankle weight: -LAQ 2x15 LLE only with min VCs to focus on knee extension for better strengthening;  Leg press: BLE 85# x15 reps with cues for proper positioning and to slow down LE movement for better motor control; LLE only 55# 2x15 with mod VCS to slow down LE movement and increase AROM for better strengthening in LLE; Pt often has difficulty relaxing into full knee flexion and therefore does partial ROM on machine;    Neuromuscular Re-education Standing in parallel bars: -forward lunges to BOSU x10 reps each LE: Standing on BOSU:  Feet slightly apart, heel raises x15 reps with intermittent rail assist, and mod VCs for weight shift to neutral position;  Feet apart, mini squat x10 reps unsupported, min A for safety and mod VCs for weight shift to neutral position; Patient often exhibits increased  weight shift to RLE with decreased weight bearing in LLE;  Outside parallel bars: Speed ladder: one foot in each square x 10 lengthswithout SPC,with Min A for safety; Patient continues to have increased left foot drag with decreased terminal knee extension at terminal swing. Pt also exhibit increased right heel off during mid/terminal stance to facilitate better LLE foot clearance;   Side stepping in speed ladder x1 lap each direction with min A for safety with mod Vcs for weight shift and to increase step length; He had increased difficulty with LLE hip adduction. Pt also exhibits increased LLE hip ER during hip abduction when stepping to left to compensate for weakness in hip abductors;  In parallel bars:  Stepping over orange hurdle #1  forward/backward x10 reps with 1 rail assist, Required min A for safety and cues to increase step length for better foot clearance; During exercise, patient instructed to utilize mirror for visual cues to facilitate improved ankle DF at heel strike. He continued to move quickly through exercise, stating, "I just want to get this done." With increased cues and correction from therapist patient proceeded to get agitated and expressed increased frustration.   Following activity, patient ambulated in gym without SPC with CGA/close supervision for safety often reverting back to heavy toe strike at initial contact with decreased step length on LLE; pt asked if he notices how his left foot drags when walking, he replied, "No. I walk fine."   Pt educated throughout session about proper posture and technique with exercises. Improved exercise technique, movement at target joints, use of target muscles after min to mod verbal, visual, tactile cues.                         PT Education - 01/03/20 1308    Education provided Yes    Education Details exercise technique, gait safety, positioning;    Person(s) Educated Patient    Methods Explanation;Verbal cues    Comprehension Verbalized understanding;Returned demonstration;Verbal cues required;Need further instruction            PT Short Term Goals - 12/14/19 1727      PT SHORT TERM GOAL #1   Title Pt will be independent with HEP in order to improve strength and balance in order to decrease fall risk and improve function at home.    Baseline 6/16: HEP compliant most days    Time 6    Period Weeks    Status Partially Met    Target Date 12/22/19             PT Long Term Goals - 12/14/19 1727      PT LONG TERM GOAL #1   Title Pt will improve BERG by at least 3 points in order to demonstrate clinically significant improvement in balance.    Baseline 11/10/19: 51/56 6/16: 53/56    Time 12    Period Weeks    Status Partially  Met    Target Date 02/02/20      PT LONG TERM GOAL #2   Title Pt will improve ABC by at least 13% in order to demonstrate clinically significant improvement in balance confidence.    Baseline 11/10/19: 43.125% 6/16: 49%    Time 12    Period Weeks    Status Partially Met    Target Date 02/02/20      PT LONG TERM GOAL #3   Title Pt will improve FOTO to at least 68 in order to  demonstrate significant improvement in function at home.    Baseline 11/10/19: 58 6/16: 50    Time 12    Period Weeks    Status On-going    Target Date 02/02/20                 Plan - 01/04/20 0817    Clinical Impression Statement Patient motivated and participated fair within session. He does continue to exhibit increased left foot drag with increased knee flexion at initial contact/mid stance. Patient instructed in activities to facilitate improved foot clearance. However patient often tries to move too quickly through activity without focusing on motor control or proper positioning. He then gets frustrated with activity correction/instruction. Patient does not seem to be fully aware of functional limitations. This exacerbates frustration between himself and caregiver. Patient does have a follow up with PCP where caregiver states they will be discussing emotional outbursts and frustration. He would benefit from additional skilled PT intervention to improve strength, balance and mobility;    Personal Factors and Comorbidities Age;Comorbidity 2    Comorbidities CVA, vascular dementia    Examination-Activity Limitations Locomotion Level;Transfers    Examination-Participation Restrictions Community Activity;Shop    Stability/Clinical Decision Making Evolving/Moderate complexity    Rehab Potential Fair    PT Frequency 2x / week    PT Duration 12 weeks    PT Treatment/Interventions ADLs/Self Care Home Management;Aquatic Therapy;Biofeedback;Cryotherapy;Canalith Repostioning;Electrical Stimulation;Iontophoresis  37m/ml Dexamethasone;Moist Heat;Traction;Ultrasound;DME Instruction;Gait training;Stair training;Functional mobility training;Therapeutic activities;Therapeutic exercise;Balance training;Neuromuscular re-education;Cognitive remediation;Patient/family education;Manual techniques;Passive range of motion;Dry needling;Vestibular    PT Next Visit Plan Review HEP, continue with strength and balance    PT Home Exercise Plan Medbridge Access Code: [email or web address](clams, marches, sit to stand green tband, tandem balance, gastroc stretch);    Consulted and Agree with Plan of Care Patient;Family member/caregiver    Family Member Consulted Wife           Patient will benefit from skilled therapeutic intervention in order to improve the following deficits and impairments:  Abnormal gait, Decreased balance, Decreased safety awareness, Decreased knowledge of precautions, Decreased cognition, Decreased mobility  Visit Diagnosis: Unsteadiness on feet  History of falling     Problem List Patient Active Problem List   Diagnosis Date Noted  . Stroke ([place] 01/27/2017  . High cholesterol 01/27/2017  . Hypertension 01/27/2017  . Carotid stenosis 01/27/2017  . Occlusion and stenosis of carotid artery with cerebral infarction 07/27/2013    Antoinetta Berrones PT, DPT 01/04/2020, 8:22 AM  [place] [facility] [address] [address]Phone: [phone]  Fax:  [phone] Name: HTaym Twist MRN: [id number] of Birth: [birth year]

## 2020-01-04 ENCOUNTER — Encounter: Payer: Self-pay | Admitting: Physical Therapy

## 2020-01-05 ENCOUNTER — Ambulatory Visit: Payer: Medicare Other

## 2020-01-05 ENCOUNTER — Other Ambulatory Visit: Payer: Self-pay

## 2020-01-05 DIAGNOSIS — Z9181 History of falling: Secondary | ICD-10-CM

## 2020-01-05 DIAGNOSIS — R2681 Unsteadiness on feet: Secondary | ICD-10-CM

## 2020-01-05 NOTE — Therapy (Signed)
[place] MAIN [facility] SERVICES [address] [place], [place], [address] Phone: [phone]   Fax:  [phone]  Physical Therapy Treatment  Patient Details  Name: Troy Pugh. MRN: [id number] Date of Birth: 1940-11-27 Referring Provider (PT): Dr. Doy Hutching   Encounter Date: 01/05/2020   PT End of Session - 01/05/20 1140    Visit Number 13    Number of Visits 25    Date for PT Re-Evaluation 02/02/20    Authorization Type eval 6/16    Authorization Time Period FOTO- PT    PT Start Time 1145    PT Stop Time 1230    PT Time Calculation (min) 45 min    Equipment Utilized During Treatment Gait belt    Activity Tolerance Patient tolerated treatment well    Behavior During Therapy WFL for tasks assessed/performed           Past Medical History:  Diagnosis Date  . 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 High cholesterol   . Hypertension   . Stroke [facility])     Past Surgical History:  Procedure Laterality Date  . CATARACT EXTRACTION    . GALLBLADDER SURGERY      There were no vitals filed for this visit.   Subjective Assessment - 01/05/20 1140    Subjective Patient reports doing okay. He saw his PCP yesterday and was prescribed Haldol. Wife thinks it might be making him drowsy. He had a little fall 2 nights ago and has an abrasion on the R side of forehead but denies any increased slurred speech, focal weakness, or new onset N/T. Denies any pain or soreneness.    Pertinent History Pt suffered a CVA 10 years ago. Due to recent worsening balance and falls his MD ordered PT. He started [facility] in January and had his last therapy session this Monday. Pt reports that he was working on balance and strengthening with therapy. He has been experiencing worsening L drop foot over the last year or so and now has an AFO which he picked up 2-3 weeks ago. Pt reports improvement in his gait since getting his AFO. He has had at least 6 falls in the last 12 months.  He initially had PT at [facility] after his CVA but was doing relatively well until recently. He has a history of vascular dementia and started Aricept last night. History is felt to be unreliable from patient and most information is provided by his wife.    Patient Stated Goals Improve balance, gait, and decrease risk for falls    Currently in Pain? No/denies             TREATMENT   Ther-ex NuStep L4 x 5 minutes for warm-up during history (3 minutes unbilled); Precor single leg press #70 LLE: x 20, x 7; Step ups 4" bilateral LE x 10 R UE support needed Alternating seated 6" step toe taps as quick as possible to work on reaction time x 20 total;   Neuromuscular Re-education  Star steps with both LE moving around a clock face x multiple bouts on each side including crossing midline; Step over 6" orange hurdle bilateral LE x 10 each; Balance on half foam roller (round side up) x 30s Gait in hallway without single point cane, repeated cues and education about practicing knee extension prior to initial contact on the left and increasing L step length to improve heel strike. 75' x 4;  Head/eye turns to pick up ball  from bin on rolling cart and throw ball into hoop while standing with feet together on Airex pad, multiple attempts;   Pt educated throughout session about proper posture and technique with exercises. Improved exercise technique, movement at target joints, use of target muscles after min to mod verbal, visual, tactile cues.    Pt demonstrates excellent motivation during session today. Continued with LE strengthening in sitting and standing with focus on LLE as it is the weaker side. Pt requires CGA to minA+1 throughout entire session for balance and safety. Patient progressed to no cane during gait in hallway. Repeated cues and education about practicing knee extension prior to initial contact on the left and increasing L step length to improve heel strike. With cognitive dual  tasking, exercises slowed down and stopped as patient had to think. Pt encouraged to continue HEP and follow-up as scheduled. Pt will benefit from PT services to address deficits in strength, balance, and mobility in order to return to full function at home.                          PT Short Term Goals - 12/14/19 1727      PT SHORT TERM GOAL #1   Title Pt will be independent with HEP in order to improve strength and balance in order to decrease fall risk and improve function at home.    Baseline 6/16: HEP compliant most days    Time 6    Period Weeks    Status Partially Met    Target Date 12/22/19             PT Long Term Goals - 12/14/19 1727      PT LONG TERM GOAL #1   Title Pt will improve BERG by at least 3 points in order to demonstrate clinically significant improvement in balance.    Baseline 11/10/19: 51/56 6/16: 53/56    Time 12    Period Weeks    Status Partially Met    Target Date 02/02/20      PT LONG TERM GOAL #2   Title Pt will improve ABC by at least 13% in order to demonstrate clinically significant improvement in balance confidence.    Baseline 11/10/19: 43.125% 6/16: 49%    Time 12    Period Weeks    Status Partially Met    Target Date 02/02/20      PT LONG TERM GOAL #3   Title Pt will improve FOTO to at least 68 in order to demonstrate significant improvement in function at home.    Baseline 11/10/19: 58 6/16: 50    Time 12    Period Weeks    Status On-going    Target Date 02/02/20                 Plan - 01/05/20 1142    Clinical Impression Statement Pt demonstrates excellent motivation during session today. Continued with LE strengthening in sitting and standing with focus on LLE as it is the weaker side. Pt requires CGA to minA+1 throughout entire session for balance and safety. Patient progressed to no cane during gait in hallway. Repeated cues and education about practicing knee extension prior to initial contact on the  left and increasing L step length to improve heel strike. With cognitive dual tasking, exercises slowed down and stopped as patient had to think. Pt encouraged to continue HEP and follow-up as scheduled. Pt will benefit from PT services to  address deficits in strength, balance, and mobility in order to return to full function at home.    Personal Factors and Comorbidities Age;Comorbidity 2    Comorbidities CVA, vascular dementia    Examination-Activity Limitations Locomotion Level;Transfers    Examination-Participation Restrictions Community Activity;Shop    Stability/Clinical Decision Making Evolving/Moderate complexity    Rehab Potential Fair    PT Frequency 2x / week    PT Duration 12 weeks    PT Treatment/Interventions ADLs/Self Care Home Management;Aquatic Therapy;Biofeedback;Cryotherapy;Canalith Repostioning;Electrical Stimulation;Iontophoresis 28m/ml Dexamethasone;Moist Heat;Traction;Ultrasound;DME Instruction;Gait training;Stair training;Functional mobility training;Therapeutic activities;Therapeutic exercise;Balance training;Neuromuscular re-education;Cognitive remediation;Patient/family education;Manual techniques;Passive range of motion;Dry needling;Vestibular    PT Next Visit Plan Review HEP, continue with strength and balance    PT Home Exercise Plan Medbridge Access Code: [email or web address](clams, marches, sit to stand green tband, tandem balance, gastroc stretch);    Consulted and Agree with Plan of Care Patient;Family member/caregiver    Family Member Consulted Wife           Patient will benefit from skilled therapeutic intervention in order to improve the following deficits and impairments:  Abnormal gait, Decreased balance, Decreased safety awareness, Decreased knowledge of precautions, Decreased cognition, Decreased mobility  Visit Diagnosis: Unsteadiness on feet  History of falling     Problem List Patient Active Problem List   Diagnosis Date Noted  . Stroke ([place]  01/27/2017  . High cholesterol 01/27/2017  . Hypertension 01/27/2017  . Carotid stenosis 01/27/2017  . Occlusion and stenosis of carotid artery with cerebral infarction 07/27/2013   JLyndel SafeHuprich PT, DPT, GCS  Edgard Debord 01/05/2020, 5:10 PM  [place]MAIN [facility]Lee [facility] CenterSERVICES [address] [place] [address]Phone: [phone]  Fax:  [phone] Name: HSedric Guia MRN: [id number] of Birth: [birth year]

## 2020-01-09 ENCOUNTER — Ambulatory Visit: Payer: Medicare Other | Admitting: Speech Pathology

## 2020-01-09 ENCOUNTER — Other Ambulatory Visit: Payer: Self-pay

## 2020-01-09 ENCOUNTER — Ambulatory Visit: Payer: Medicare Other

## 2020-01-09 DIAGNOSIS — R41841 Cognitive communication deficit: Secondary | ICD-10-CM

## 2020-01-09 DIAGNOSIS — R2681 Unsteadiness on feet: Secondary | ICD-10-CM

## 2020-01-09 DIAGNOSIS — Z9181 History of falling: Secondary | ICD-10-CM

## 2020-01-09 DIAGNOSIS — R490 Dysphonia: Secondary | ICD-10-CM

## 2020-01-09 NOTE — Therapy (Signed)
[facility] SERVICES [address] [place], [place], [address] Phone: [phone]   Fax:  [phone]  Physical Therapy Treatment  Patient Details  Name: Troy C Christopoulos Jr. MRN: [id number] Date of Birth: 04/04/1941 Referring Provider (PT): Dr. Sparks   Encounter Date: 01/09/2020   PT End of Session - 01/09/20 1054    Visit Number 14    Number of Visits 25    Date for PT Re-Evaluation 02/02/20    Authorization Type eval 6/16    Authorization Time Period FOTO- PT    PT Start Time 1100    PT Stop Time 1145    PT Time Calculation (min) 45 min    Equipment Utilized During Treatment Gait belt    Activity Tolerance Patient tolerated treatment well    Behavior During Therapy WFL for tasks assessed/performed           Past Medical History:  Diagnosis Date  . Carotid stent occlusion (HCC)   . High cholesterol   . Hypertension   . Stroke (HCC)     Past Surgical History:  Procedure Laterality Date  . CATARACT EXTRACTION    . GALLBLADDER SURGERY      There were no vitals filed for this visit.   Subjective Assessment - 01/09/20 1054    Subjective Patient reports doing alright today. Wife reports that he has an appointment with his neurologist this afternoon. Wife hasn't noticed significant improvement in his behavior since starting the Haldol. He is still having some increased drowsiness. No falls since last therapy session. Denies any pain or soreneness.    Pertinent History Pt suffered a CVA 10 years ago. Due to recent worsening balance and falls his MD ordered PT. He started HH PT in January and had his last therapy session this Monday. Pt reports that he was working on balance and strengthening with therapy. He has been experiencing worsening L drop foot over the last year or so and now has an AFO which he picked up 2-3 weeks ago. Pt reports improvement in his gait since getting his AFO. He has had at least 6 falls in the last 12  months. He initially had PT at [facility] after his CVA but was doing relatively well until recently. He has a history of vascular dementia and started Aricept last night. History is felt to be unreliable from patient and most information is provided by his wife.    Patient Stated Goals Improve balance, gait, and decrease risk for falls    Currently in Pain? No/denies               TREATMENT   Ther-ex NuStep L4 x 5 minutes for warm-up during history (3 minutes unbilled); Precor single leg press #70 LLE: x 20, x 18; Precor BLE leg press 115# x 20; Standing hip flexion marches with 5# ankle weights (AW) x 10 BLE; Standing hip abduction with 5# AW x 10 BLE; Seated LAQ with 5# AW x 10 BLE; Standing HS curls with 5# AW x 10 BLE;   Neuromuscular Re-education  Agility ladder forward stepping x 4 lengths, extensive cues for step length for one foot in each box; Agility ladder sidestepping x 2 lengths, modA+1 assist to prevent him from falling backwards multiple times during session; Gait in hallway without single point cane, repeated cues and education about practicing knee extension prior to initial contact on the left and increasing L step length to improve heel strike. 75' x 4;    Gait in hallway without single point cane, repeated cues and education about practicing knee extension, utilized metronome to decreased cadence and increase step length; Soccer ball kicks in hallway with student pt from both static and dynamic ball positions, encouraged increased force especially on the L side, pt demonstrates intermittent difficulty with coordination kicking ball especially from rolling starting position;   Pt educated throughout session about proper posture and technique with exercises. Improved exercise technique, movement at target joints, use of target muscles after min to mod verbal, visual, tactile cues.    Pt demonstrates excellent motivation during session today. Continued with LE  strengthening in sitting and standing with focus on LLE as it is the weaker side. Pt requires CGA to minA+1 throughout entire session for balance and safety. Repeated cues and education about practicing knee extension prior to initial contact on the left and increasing L step length to improve heel strike during gait in hallway and utilized metronome today to decrease gait cadence and improve step length. Also utilized soccer ball kicks to work on Dietitian and LE power. Pt has follow-up appointment with neurology this afternoon. Pt encouraged to continue HEP and follow-up as scheduled. Pt will benefit from PT services to address deficits in strength, balance, and mobility in order to return to full function at home.                         PT Short Term Goals - 12/14/19 1727      PT SHORT TERM GOAL #1   Title Pt will be independent with HEP in order to improve strength and balance in order to decrease fall risk and improve function at home.    Baseline 6/16: HEP compliant most days    Time 6    Period Weeks    Status Partially Met    Target Date 12/22/19             PT Long Term Goals - 12/14/19 1727      PT LONG TERM GOAL #1   Title Pt will improve BERG by at least 3 points in order to demonstrate clinically significant improvement in balance.    Baseline 11/10/19: 51/56 6/16: 53/56    Time 12    Period Weeks    Status Partially Met    Target Date 02/02/20      PT LONG TERM GOAL #2   Title Pt will improve ABC by at least 13% in order to demonstrate clinically significant improvement in balance confidence.    Baseline 11/10/19: 43.125% 6/16: 49%    Time 12    Period Weeks    Status Partially Met    Target Date 02/02/20      PT LONG TERM GOAL #3   Title Pt will improve FOTO to at least 68 in order to demonstrate significant improvement in function at home.    Baseline 11/10/19: 58 6/16: 50    Time 12    Period Weeks    Status On-going    Target Date  02/02/20                 Plan - 01/09/20 1055    Clinical Impression Statement Pt demonstrates excellent motivation during session today. Continued with LE strengthening in sitting and standing with focus on LLE as it is the weaker side. Pt requires CGA to minA+1 throughout entire session for balance and safety. Repeated cues and education about practicing knee extension prior to  initial contact on the left and increasing L step length to improve heel strike during gait in hallway and utilized metronome today to decrease gait cadence and improve step length. Also utilized soccer ball kicks to work on Dietitian and LE power. Pt has follow-up appointment with neurology this afternoon. Pt encouraged to continue HEP and follow-up as scheduled. Pt will benefit from PT services to address deficits in strength, balance, and mobility in order to return to full function at home.    Personal Factors and Comorbidities Age;Comorbidity 2    Comorbidities CVA, vascular dementia    Examination-Activity Limitations Locomotion Level;Transfers    Examination-Participation Restrictions Community Activity;Shop    Stability/Clinical Decision Making Evolving/Moderate complexity    Rehab Potential Fair    PT Frequency 2x / week    PT Duration 12 weeks    PT Treatment/Interventions ADLs/Self Care Home Management;Aquatic Therapy;Biofeedback;Cryotherapy;Canalith Repostioning;Electrical Stimulation;Iontophoresis 63m/ml Dexamethasone;Moist Heat;Traction;Ultrasound;DME Instruction;Gait training;Stair training;Functional mobility training;Therapeutic activities;Therapeutic exercise;Balance training;Neuromuscular re-education;Cognitive remediation;Patient/family education;Manual techniques;Passive range of motion;Dry needling;Vestibular    PT Next Visit Plan Review HEP, continue with strength and balance    PT Home Exercise Plan Medbridge Access Code: [email or web address](clams, marches, sit to stand green tband, tandem balance,  gastroc stretch);    Consulted and Agree with Plan of Care Patient;Family member/caregiver    Family Member Consulted Wife           Patient will benefit from skilled therapeutic intervention in order to improve the following deficits and impairments:  Abnormal gait, Decreased balance, Decreased safety awareness, Decreased knowledge of precautions, Decreased cognition, Decreased mobility  Visit Diagnosis: Unsteadiness on feet  History of falling     Problem List Patient Active Problem List   Diagnosis Date Noted  . [place] 01/27/2017  . High cholesterol 01/27/2017  . Hypertension 01/27/2017  . Carotid stenosis 01/27/2017  . Occlusion and stenosis of carotid artery with cerebral infarction 07/27/2013   JLyndel SafeHuprich PT, DPT, GCS  Byrd Rushlow 01/09/2020, 12:02 PM  [facility] [address] [place] [address]Phone: [phone]  Fax:  [phone] Name: Troy Pugh MRN: [id number] of Birth: 1January 28, 1942

## 2020-01-10 ENCOUNTER — Encounter: Payer: Self-pay | Admitting: Speech Pathology

## 2020-01-10 ENCOUNTER — Other Ambulatory Visit: Payer: Self-pay

## 2020-01-10 NOTE — Therapy (Signed)
[place] MAIN [facility] SERVICES [address] [place], [place], [address] Phone: [phone]   Fax:  [phone]  Speech Language Pathology Evaluation  Patient Details  Name: Troy Pugh. MRN: [id number] Date of Birth: 1941-01-02 Referring Provider (SLP): Dr. Manuella Ghazi   Encounter Date: 01/09/2020   End of Session - 01/10/20 1506    Visit Number 1    Number of Visits 17    Date for SLP Re-Evaluation 03/09/20    Authorization Type Medicare    Authorization Time Period Start 01/09/2020    Authorization - Visit Number 1    Progress Note Due on Visit 10    SLP Start Time 1000    SLP Stop Time  1045    SLP Time Calculation (min) 45 min    Activity Tolerance Patient tolerated treatment well           Past Medical History:  Diagnosis Date  . Carotid stent occlusion ([place])   . High cholesterol   . Hypertension   . Stroke [facility])     Past Surgical History:  Procedure Laterality Date  . CATARACT EXTRACTION    . GALLBLADDER SURGERY      There were no vitals filed for this visit.       SLP Evaluation [facility] - 01/10/20 0001      SLP Visit Information   SLP Received On 01/09/20    Referring Provider (SLP) Dr. Manuella Ghazi    Onset Date 11/07/2019    Medical Diagnosis Cerebrovascular disease      Subjective   Subjective The patient is eager to improve his speech intelligibility       Pain Assessment   Currently in Pain? No/denies      General Information   HPI Tommey Pugh is a 79 year old man referred for cognitive-linguistic treatment secondary cerebrovascular disease.  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  He received speech therapy at this setting 02/23/2015-03/23/2015 focusing on speech intelligibility.      Prior Functional Status   Cognitive/Linguistic Baseline Baseline deficits      Cognition   Overall Cognitive Status History of cognitive impairments - at baseline      Auditory Comprehension   Overall  Auditory Comprehension Appears within functional limits for tasks assessed      Reading Comprehension   Reading Status Within funtional limits      Verbal Expression   Overall Verbal Expression Appears within functional limits for tasks assessed      Written Expression   Written Expression Within Functional Limits      Oral Motor/Sensory Function   Overall Oral Motor/Sensory Function Appears within functional limits for tasks assessed      Motor Speech   Overall Motor Speech Impaired    Respiration Impaired    Level of Impairment Conversation    Phonation Breathy;Low vocal intensity    Resonance Hypernasality    Articulation Within functional limitis    Intelligibility Intelligibility reduced   Due to hyperphonia and hypernasality   Phonation Impaired    Vocal Abuses Habitual Hyperphonia;Glottal Attack    Volume Soft    Pitch Appropriate      Standardized Assessments   Standardized Assessments  Montreal Cognitive Assessment (MOCA);Other Assessment   Oral Motor/Motor Speech          Montreal Cognitive Assessment [facility]) Version: 8.1 Visuospatial/Executive Alternating trail making       0/1 Visuoconstruction Skills (copy 3-d design) 0/1 Draw a clock  2/3 Naming     3/3 Attention Forward digit span    1/1 Backward digit span    1/1 Vigilance     0/1 Serial 7's     0/3 Language  Verbal Fluency     0/1 Repetition     1/2 Abstraction     2/2 Delayed Recall     0/5  Memory Index Score   5/15 Orientation     5/6 TOTAL      15/30       Normal  ? 26/30     Motor Speech Evaluation Lips: ROM and strength within normal limits, mildly decreased planning/coordination for rapid alternating movements.  Tongue: ROM and strength within normal limits, mildly decreased planning/coordination for rapid alternating movements.  Jaw: Within normal limits Soft palate: Within normal limits when cued to phonate loudly Oral agility: mildly decreased planning/coordination for rapid  alternating movements.  Sensory: Within normal limits Voice: Hypophonic Respiration:  Within normal limits Resonance:  Hypernasal speech Intelligibility: Reduced secondary hypophonia and hypernasality  Other Observations: The patient's wife reports swallowing difficulties described as poor attention to the task of eating- eating to fast, taking more food before swallowing, etc.  The patient's wife is implementing strategies that improve mealtime safety with minimal external cuing required.  The patient is able to read a paragraph aloud accurately and answer questions (when cued to look for answer while keeping question in mind).  He is able to write simple phrases legibly.      SLP Education - 01/10/20 1505    Education provided Yes    Education Details Results and POC    Person(s) Educated Patient;Spouse    Methods Explanation    Comprehension Verbalized understanding              SLP Long Term Goals - 01/10/20 1510      SLP LONG TERM GOAL #1   Title The patient will maximize voice quality, oral resonance, and loudness using breath support for sustained vowel production, pitch glides, and hierarchal speech drill.    Time 8    Period Weeks    Status New    Target Date 03/09/20      SLP LONG TERM GOAL #2   Title The patient will maximize voice quality, oral resonance, and loudness using breath support for paragraph length recitation with 80% accuracy.    Time 8    Period Weeks    Status New    Target Date 03/09/20      SLP LONG TERM GOAL #3   Title Patient and his wife will participate in developing functional compensatory strategies to improve engagement, activity, and social interactions.    Time 8    Period Weeks    Status New    Target Date 03/09/20            Plan - 01/10/20 1508    Clinical Impression Statement This 79 year old man with vascular dementia is presenting with moderate-severe dysphonia. He demonstrates hypernasality, strained/breathy vocal  quality, reduced breath support and control for speech, and laryngeal tension.  Stimulability testing showed the patient is able to generate a stronger, better quality voice given cues and SLP model.  He will benefit from voice therapy for education, to improve breath control/support for speech, reduce laryngeal tension, and learn techniques to decrease nasality and to increase loudness and pitch range without strain.    The patient scored 15/30 on the [facility] with deficits in visuospatial skills, executive skills, attention, and memory.  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  Language is a relative strength with functional oral and written expression and comprehension for simple/concrete information.  We will offer ongoing counseling and suggestions for specific behaviors identified by the patient and his wife.    Speech Therapy Frequency 2x / week    Duration Other (comment)   8 weeks   Treatment/Interventions Patient/family education;SLP instruction and feedback;Compensatory techniques    Potential to Achieve Goals Good    Potential Considerations Ability to learn/carryover information;Cooperation/participation level;Previous level of function;Severity of impairments;Family/community support;Other (comment)    Consulted and Agree with Plan of Care Patient;Family member/caregiver    Family Member Consulted Spouse           Patient will benefit from skilled therapeutic intervention in order to improve the following deficits and impairments:   Dysphonia - Plan: SLP plan of care cert/re-cert  Cognitive communication deficit - Plan: SLP plan of care cert/re-cert    Problem List Patient Active Problem List   Diagnosis Date Noted  . Stroke ([place]) 01/27/2017  . High cholesterol 01/27/2017  . Hypertension 01/27/2017  . Carotid stenosis 01/27/2017  . Occlusion and stenosis of carotid  artery with cerebral infarction 07/27/2013   [name], MS/CCC- SLP  [name] 01/10/2020, 3:14 PM  [place] MAIN [facility] SERVICES [address] [place], [place], [address] Phone: [phone]   Fax:  [phone]  Name: Yi Haugan. MRN: [id number] Date of Birth: 1941-04-04

## 2020-01-12 ENCOUNTER — Other Ambulatory Visit: Payer: Self-pay | Admitting: Physician Assistant

## 2020-01-12 ENCOUNTER — Ambulatory Visit: Payer: Medicare Other

## 2020-01-12 ENCOUNTER — Ambulatory Visit
Admission: RE | Admit: 2020-01-12 | Discharge: 2020-01-12 | Disposition: A | Discharge status: Home / Self Care | Payer: Medicare Other | Source: Ambulatory Visit | Attending: Physician Assistant | Admitting: Physician Assistant

## 2020-01-12 ENCOUNTER — Other Ambulatory Visit: Payer: Self-pay

## 2020-01-12 DIAGNOSIS — R29898 Other symptoms and signs involving the musculoskeletal system: Secondary | ICD-10-CM

## 2020-01-12 DIAGNOSIS — F0151 Vascular dementia with behavioral disturbance: Secondary | ICD-10-CM | POA: Insufficient documentation

## 2020-01-12 DIAGNOSIS — F01518 Vascular dementia, unspecified severity, with other behavioral disturbance: Secondary | ICD-10-CM

## 2020-01-16 ENCOUNTER — Ambulatory Visit: Payer: Medicare Other

## 2020-01-16 ENCOUNTER — Encounter: Payer: Self-pay | Admitting: Speech Pathology

## 2020-01-16 ENCOUNTER — Ambulatory Visit: Payer: Medicare Other | Admitting: Speech Pathology

## 2020-01-16 ENCOUNTER — Other Ambulatory Visit: Payer: Self-pay

## 2020-01-16 DIAGNOSIS — Z9181 History of falling: Secondary | ICD-10-CM

## 2020-01-16 DIAGNOSIS — R2681 Unsteadiness on feet: Secondary | ICD-10-CM | POA: Diagnosis not present

## 2020-01-16 DIAGNOSIS — R490 Dysphonia: Secondary | ICD-10-CM

## 2020-01-16 NOTE — Therapy (Signed)
[place] MAIN [facility] SERVICES [address] [place], [place], [address] Phone: [phone]   Fax:  [phone]  Speech Language Pathology Treatment  Patient Details  Name: Troy Pugh. MRN: [id number] Date of Birth: 1940-10-23 Referring Provider (SLP): Dr. Manuella Ghazi   Encounter Date: 01/16/2020   End of Session - 01/16/20 1236    Visit Number 2    Number of Visits 17    Date for SLP Re-Evaluation 03/09/20    Authorization Type Medicare    Authorization Time Period Start 01/09/2020    Authorization - Visit Number 2    Progress Note Due on Visit 10    SLP Start Time 0955    SLP Stop Time  1040    SLP Time Calculation (min) 45 min    Activity Tolerance Patient tolerated treatment well           Past Medical History:  Diagnosis Date  . Carotid stent occlusion ([place])   . High cholesterol   . Hypertension   . Stroke [facility])     Past Surgical History:  Procedure Laterality Date  . CATARACT EXTRACTION    . GALLBLADDER SURGERY      There were no vitals filed for this visit.   Subjective Assessment - 01/16/20 1235    Subjective Willing to participate, "I think I did pretty good"                 ADULT SLP TREATMENT - 01/16/20 0001      General Information   Behavior/Cognition Alert;Cooperative;Pleasant mood      Treatment Provided   Treatment provided Cognitive-Linquistic      Pain Assessment   Pain Assessment No/denies pain      Cognitive-Linquistic Treatment   Treatment focused on Voice    Skilled Treatment Practiced 15 loud "ahs" with good voice quality; moderate level of cueing (sit up straight, dig deep, use that loud voice); average duration of 5 seconds. Read aloud Harvard sentences at 65% intelligibility. Expressed incongruencies in pictures and identified how they were fixed. Was 70% intelligible in general conversation.      Assessment / Recommendations / Plan   Plan Continue with current plan of care       Progression Toward Goals   Progression toward goals Progressing toward goals            SLP Education - 01/16/20 1236    Education provided Yes    Education Details Good quality, loud voice    Person(s) Educated Patient;Spouse    Methods Explanation;Demonstration    Comprehension Verbalized understanding              SLP Long Term Goals - 01/10/20 1510      SLP LONG TERM GOAL #1   Title The patient will maximize voice quality, oral resonance, and loudness using breath support for sustained vowel production, pitch glides, and hierarchal speech drill.    Time 8    Period Weeks    Status New    Target Date 03/09/20      SLP LONG TERM GOAL #2   Title The patient will maximize voice quality, oral resonance, and loudness using breath support for paragraph length recitation with 80% accuracy.    Time 8    Period Weeks    Status New    Target Date 03/09/20      SLP LONG TERM GOAL #3   Title Patient and his wife will participate in developing functional compensatory  strategies to improve engagement, activity, and social interactions.    Time 8    Period Weeks    Status New    Target Date 03/09/20            Plan - 01/16/20 1237    Clinical Impression Statement Patient demonstrated a good quality, loud voice with a moderate level of cueing from clinician. Instructed to use slower and louder voice to improve intelligibility and reduce hypernasality. Will continue to work on generalizing loud voice into everyday speech.    Speech Therapy Frequency 2x / week    Duration Other (comment)    Treatment/Interventions Patient/family education;SLP instruction and feedback;Compensatory techniques    Potential to Achieve Goals Good    Potential Considerations Ability to learn/carryover information;Cooperation/participation level;Previous level of function;Severity of impairments;Family/community support;Other (comment)           Patient will benefit from skilled therapeutic  intervention in order to improve the following deficits and impairments:   Dysphonia    Problem List Patient Active Problem List   Diagnosis Date Noted  . Stroke ([place]) 01/27/2017  . High cholesterol 01/27/2017  . Hypertension 01/27/2017  . Carotid stenosis 01/27/2017  . Occlusion and stenosis of carotid artery with cerebral infarction 07/27/2013    Maylon Cos, Student Intern 01/16/2020, 12:37 PM  [place] MAIN [facility] SERVICES [address] [place], [place], [address] Phone: [phone]   Fax:  [phone]   Name: Tudor Chandley. MRN: [id number] Date of Birth: Jan 03, 1941

## 2020-01-16 NOTE — Therapy (Signed)
[place] MAIN [facility] SERVICES [address] [place], [place], [address] Phone: [phone]   Fax:  [phone]  Physical Therapy Treatment  Patient Details  Name: Troy Pugh. MRN: [id number] Date of Birth: [birth year] Referring Provider (PT): Dr. Doy Hutching   Encounter Date: 01/16/2020   PT End of Session - 01/16/20 1052    Visit Number 15    Number of Visits 25    Date for PT Re-Evaluation 02/02/20    Authorization Type eval 6/16    Authorization Time Period FOTO- PT    PT Start Time 1100    PT Stop Time 1145    PT Time Calculation (min) 45 min    Equipment Utilized During Treatment Gait belt    Activity Tolerance Patient tolerated treatment well    Behavior During Therapy WFL for tasks assessed/performed           Past Medical History:  Diagnosis Date   Carotid stent occlusion (HCC)    High cholesterol    Hypertension    Stroke [facility])     Past Surgical History:  Procedure Laterality Date   CATARACT EXTRACTION     GALLBLADDER SURGERY      There were no vitals filed for this visit.   Subjective Assessment - 01/16/20 1050    Subjective Patient saw Renelda Loma PA-C at [facility] on 01/12/20 and he was advised to hold the haldol and Abilify due to falls. He was also have worsening LLE weakness. Head CT was ordered and did not show any acute changes. No falls since last therapy session. Denies any pain or soreneness. No specific questions upon arrival.    Pertinent History Pt suffered a CVA 10 years ago. Due to recent worsening balance and falls his MD ordered PT. He started [facility] PT in January and had his last therapy session this Monday. Pt reports that he was working on balance and strengthening with therapy. He has been experiencing worsening L drop foot over the last year or so and now has an AFO which he picked up 2-3 weeks ago. Pt reports improvement in his gait since getting his AFO. He has had at least 6  falls in the last 12 months. He initially had PT at [facility] after his CVA but was doing relatively well until recently. He has a history of vascular dementia and started Aricept last night. History is felt to be unreliable from patient and most information is provided by his wife.    Patient Stated Goals Improve balance, gait, and decrease risk for falls    Currently in Pain? No/denies              TREATMENT   Ther-ex NuStep L4 x 5 minutes for warm-up during history (3 minutes unbilled); Seated clams with manual resistance x 20; Seated adductor squeeze x 20; Sit to stand with BUE handheld support 2 x 10; Standing mini squat with BUE support x 10; Staniding heel raises x 20; 6" alternating step-ups with faded UE support 2-1, x 10 with each side;   Neuromuscular Re-education  Forward/backward step-over 1/2 foam roll with faded UE support 2-1 x 10; Lateral step over 1/2 foam roll with faded UE support 2-1 x 10 each direction; Seated alternating rapid 6" step taps to work on speed generation x 10 each; Standing alternating 6" step taps without UE support x 10 each;  Rockerboard R/L static balance without UE support 30s x 2; Rockerboard R/L lateral weight  shifting x 30s;   Pt educated throughout session about proper posture and technique with exercises. Improved exercise technique, movement at target joints, use of target muscles after min to mod verbal, visual, tactile cues.    Pt demonstrates excellent motivation during session today. Continued with LE strengthening in sitting and standing. Pt requires CGA to minA+1 throughout entire session for balance and safety. He is more unsteady today during ambulation and demonstrates increased L toe drag today compared to previous sessions. Pt had recent head CT which was negative for acute changes. Education with wife regarding resources and steps if patient continues to demonstrate unsafe or combative behavior. Pt encouraged to continue  HEP and follow-up as scheduled. Pt will benefit from PT services to address deficits in strength, balance, and mobility in order to return to full function at home.                           PT Short Term Goals - 12/14/19 1727      PT SHORT TERM GOAL #1   Title Pt will be independent with HEP in order to improve strength and balance in order to decrease fall risk and improve function at home.    Baseline 6/16: HEP compliant most days    Time 6    Period Weeks    Status Partially Met    Target Date 12/22/19             PT Long Term Goals - 12/14/19 1727      PT LONG TERM GOAL #1   Title Pt will improve BERG by at least 3 points in order to demonstrate clinically significant improvement in balance.    Baseline 11/10/19: 51/56 6/16: 53/56    Time 12    Period Weeks    Status Partially Met    Target Date 02/02/20      PT LONG TERM GOAL #2   Title Pt will improve ABC by at least 13% in order to demonstrate clinically significant improvement in balance confidence.    Baseline 11/10/19: 43.125% 6/16: 49%    Time 12    Period Weeks    Status Partially Met    Target Date 02/02/20      PT LONG TERM GOAL #3   Title Pt will improve FOTO to at least 68 in order to demonstrate significant improvement in function at home.    Baseline 11/10/19: 58 6/16: 50    Time 12    Period Weeks    Status On-going    Target Date 02/02/20                 Plan - 01/16/20 1052    Clinical Impression Statement Pt demonstrates excellent motivation during session today. Continued with LE strengthening in sitting and standing. Pt requires CGA to minA+1 throughout entire session for balance and safety. He is more unsteady today during ambulation and demonstrates increased L toe drag today compared to previous sessions. Pt had recent head CT which was negative for acute changes. Education with wife regarding resources and steps if patient continues to demonstrate unsafe or  combative behavior. Pt encouraged to continue HEP and follow-up as scheduled. Pt will benefit from PT services to address deficits in strength, balance, and mobility in order to return to full function at home.    Personal Factors and Comorbidities Age;Comorbidity 2    Comorbidities CVA, vascular dementia    Examination-Activity Limitations Locomotion Level;Transfers  Examination-Participation Restrictions Community Activity;Shop    Stability/Clinical Decision Making Evolving/Moderate complexity    Rehab Potential Fair    PT Frequency 2x / week    PT Duration 12 weeks    PT Treatment/Interventions ADLs/Self Care Home Management;Aquatic Therapy;Biofeedback;Cryotherapy;Canalith Repostioning;Electrical Stimulation;Iontophoresis 56m/ml Dexamethasone;Moist Heat;Traction;Ultrasound;DME Instruction;Gait training;Stair training;Functional mobility training;Therapeutic activities;Therapeutic exercise;Balance training;Neuromuscular re-education;Cognitive remediation;Patient/family education;Manual techniques;Passive range of motion;Dry needling;Vestibular    PT Next Visit Plan Review HEP, continue with strength and balance    PT Home Exercise Plan Medbridge Access Code: [email or web address](clams, marches, sit to stand green tband, tandem balance, gastroc stretch);    Consulted and Agree with Plan of Care Patient;Family member/caregiver    Family Member Consulted Wife           Patient will benefit from skilled therapeutic intervention in order to improve the following deficits and impairments:  Abnormal gait, Decreased balance, Decreased safety awareness, Decreased knowledge of precautions, Decreased cognition, Decreased mobility  Visit Diagnosis: Unsteadiness on feet  History of falling     Problem List Patient Active Problem List   Diagnosis Date Noted   [place] 01/27/2017   High cholesterol 01/27/2017   Hypertension 01/27/2017   Carotid stenosis 01/27/2017   Occlusion and stenosis of  carotid artery with cerebral infarction 07/27/2013   JLyndel SafeHuprich PT, DPT, GCS  Meya Clutter 01/16/2020, 1:25 PM  [place] [address]: [phone]  Fax:  [phone] Name: HBrondon Wann MRN: [id number] of Birth: 126-Jul-1942

## 2020-01-19 ENCOUNTER — Encounter: Payer: Self-pay | Admitting: Speech Pathology

## 2020-01-19 ENCOUNTER — Ambulatory Visit: Payer: Medicare Other | Admitting: Speech Pathology

## 2020-01-19 ENCOUNTER — Ambulatory Visit: Payer: Medicare Other

## 2020-01-19 ENCOUNTER — Other Ambulatory Visit: Payer: Self-pay

## 2020-01-19 DIAGNOSIS — R490 Dysphonia: Secondary | ICD-10-CM

## 2020-01-19 DIAGNOSIS — R2681 Unsteadiness on feet: Secondary | ICD-10-CM

## 2020-01-19 DIAGNOSIS — Z9181 History of falling: Secondary | ICD-10-CM

## 2020-01-19 NOTE — Therapy (Signed)
[place] MAIN [facility] SERVICES [address] [place], [place], [address] Phone: [phone]   Fax:  [phone]  Physical Therapy Treatment  Patient Details  Name: Troy Pugh. MRN: [id number] Date of Birth: Jan 15, 1941 Referring Provider (PT): Dr. Doy Hutching   Encounter Date: 01/19/2020   PT End of Session - 01/19/20 1235    Visit Number 16    Number of Visits 25    Date for PT Re-Evaluation 02/02/20    Authorization Type eval 6/16    Authorization Time Period FOTO- PT    PT Start Time 1015    PT Stop Time 1100    PT Time Calculation (min) 45 min    Equipment Utilized During Treatment Gait belt    Activity Tolerance Patient tolerated treatment well    Behavior During Therapy WFL for tasks assessed/performed           Past Medical History:  Diagnosis Date  . Carotid stent occlusion ([place])   . High cholesterol   . Hypertension   . Stroke [facility])     Past Surgical History:  Procedure Laterality Date  . CATARACT EXTRACTION    . GALLBLADDER SURGERY      There were no vitals filed for this visit.   Subjective Assessment - 01/19/20 1014    Subjective Pt is doing well today. Wife reports no worsening of behavior/irritability after stopping the Haldol/Abilify. She is in the process of arranging for a [facility] Fl Aid. No pain reported oupon arrival and L forearm skin flap continues to improve. No falls since last therapy session. No specific questions or concern.    Pertinent History Pt suffered a CVA 10 years ago. Due to recent worsening balance and falls his MD ordered PT. He started [facility] PT in January and had his last therapy session this Monday. Pt reports that he was working on balance and strengthening with therapy. He has been experiencing worsening L drop foot over the last year or so and now has an AFO which he picked up 2-3 weeks ago. Pt reports improvement in his gait since getting his AFO. He has had at least 6 falls in the last 12 months.  He initially had PT at [facility] after his CVA but was doing relatively well until recently. He has a history of vascular dementia and started Aricept last night. History is felt to be unreliable from patient and most information is provided by his wife.    Patient Stated Goals Improve balance, gait, and decrease risk for falls    Currently in Pain? No/denies               TREATMENT   Ther-ex NuStep L4 x 5 minutes for warm-up during history (2 minutes unbilled); Seated clams with manual resistance x 15 Seated adductor squeeze x 15 Sit to stand with no UE support 2 x 10; Standing mini squat with BUE support x 10; Standing hip flexion marches with 3# ankle weights (AW) x 15 BLE; Standing hip abduction with 3# AW x 15 BLE; Standing HS curls with 3# AW x 15 BLE;   Neuromuscular Re-education  Static balance on 1/2 foam roll (round side up) 30s x 2 each; Forward/backward step-over 1/2 foam roll with faded UE support 2-1 x 10; Lateral step over 1/2 foam roll with faded UE support 2-1 x 10 each direction;   Pt educated throughout session about proper posture and technique with exercises. Improved exercise technique, movement at target joints, use of target  muscles after min to mod verbal, visual, tactile cues.    Pt demonstrates excellent motivation during session today. Continued with LE strengthening in sitting and standing. Pt requires CGA to minA+1 throughout entire session for balance and safety. He appears more stable today and was able to perform sit to stand without UE handheld support. More time spent on strengthening but included some balance exercises today as well. Pt encouraged to continue HEP and follow-up as scheduled. Pt will benefit from PT services to address deficits in strength, balance, and mobility in order to return to full function at home.                        PT Short Term Goals - 12/14/19 1727      PT SHORT TERM GOAL #1   Title Pt will  be independent with HEP in order to improve strength and balance in order to decrease fall risk and improve function at home.    Baseline 6/16: HEP compliant most days    Time 6    Period Weeks    Status Partially Met    Target Date 12/22/19             PT Long Term Goals - 12/14/19 1727      PT LONG TERM GOAL #1   Title Pt will improve BERG by at least 3 points in order to demonstrate clinically significant improvement in balance.    Baseline 11/10/19: 51/56 6/16: 53/56    Time 12    Period Weeks    Status Partially Met    Target Date 02/02/20      PT LONG TERM GOAL #2   Title Pt will improve ABC by at least 13% in order to demonstrate clinically significant improvement in balance confidence.    Baseline 11/10/19: 43.125% 6/16: 49%    Time 12    Period Weeks    Status Partially Met    Target Date 02/02/20      PT LONG TERM GOAL #3   Title Pt will improve FOTO to at least 68 in order to demonstrate significant improvement in function at home.    Baseline 11/10/19: 58 6/16: 50    Time 12    Period Weeks    Status On-going    Target Date 02/02/20                 Plan - 01/19/20 1026    Clinical Impression Statement Pt demonstrates excellent motivation during session today. Continued with LE strengthening in sitting and standing. Pt requires CGA to minA+1 throughout entire session for balance and safety. He appears more stable today and was able to perform sit to stand without UE handheld support. More time spent on strengthening but included some balance exercises today as well. Pt encouraged to continue HEP and follow-up as scheduled. Pt will benefit from PT services to address deficits in strength, balance, and mobility in order to return to full function at home.    Personal Factors and Comorbidities Age;Comorbidity 2    Comorbidities CVA, vascular dementia    Examination-Activity Limitations Locomotion Level;Transfers    Examination-Participation Restrictions  Community Activity;Shop    Stability/Clinical Decision Making Evolving/Moderate complexity    Rehab Potential Fair    PT Frequency 2x / week    PT Duration 12 weeks    PT Treatment/Interventions ADLs/Self Care Home Management;Aquatic Therapy;Biofeedback;Cryotherapy;Canalith Repostioning;Electrical Stimulation;Iontophoresis 56m/ml Dexamethasone;Moist Heat;Traction;Ultrasound;DME Instruction;Gait training;Stair training;Functional mobility training;Therapeutic activities;Therapeutic exercise;Balance training;Neuromuscular re-education;Cognitive  remediation;Patient/family education;Manual techniques;Passive range of motion;Dry needling;Vestibular    PT Next Visit Plan Review HEP, continue with strength and balance    PT Home Exercise Plan Medbridge Access Code: [email or web address] (clams, marches, sit to stand green tband, tandem balance, gastroc stretch);    Consulted and Agree with Plan of Care Patient;Family member/caregiver    Family Member Consulted Wife           Patient will benefit from skilled therapeutic intervention in order to improve the following deficits and impairments:  Abnormal gait, Decreased balance, Decreased safety awareness, Decreased knowledge of precautions, Decreased cognition, Decreased mobility  Visit Diagnosis: Unsteadiness on feet  History of falling     Problem List Patient Active Problem List   Diagnosis Date Noted  . Stroke ([place]) 01/27/2017  . High cholesterol 01/27/2017  . Hypertension 01/27/2017  . Carotid stenosis 01/27/2017  . Occlusion and stenosis of carotid artery with cerebral infarction 07/27/2013   Phillips Grout PT, DPT, GCS  Luda Charbonneau 01/19/2020, 12:41 PM  [place] MAIN [facility] SERVICES [address] [place], [place], [address] Phone: [phone]   Fax:  [phone]  Name: Troy Pugh. MRN: [id number] Date of Birth: August 21, 1940

## 2020-01-19 NOTE — Therapy (Signed)
[place] MAIN [facility] SERVICES [address] [place], [place], [address] Phone: [phone]   Fax:  [phone]  Speech Language Pathology Treatment  Patient Details  Name: Troy Pugh. MRN: [id number] Date of Birth: Aug 17, 1940 Referring Provider (SLP): Dr. Manuella Ghazi   Encounter Date: 01/19/2020   End of Session - 01/19/20 1350    Visit Number 3    Number of Visits 17    Date for SLP Re-Evaluation 03/09/20    Authorization Type Medicare    Authorization Time Period Start 01/09/2020    Authorization - Visit Number 3    Progress Note Due on Visit 10    SLP Start Time 1100    SLP Stop Time  1145    SLP Time Calculation (min) 45 min    Activity Tolerance Patient tolerated treatment well           Past Medical History:  Diagnosis Date  . Carotid stent occlusion ([place])   . High cholesterol   . Hypertension   . Stroke [facility])     Past Surgical History:  Procedure Laterality Date  . CATARACT EXTRACTION    . GALLBLADDER SURGERY      There were no vitals filed for this visit.   Subjective Assessment - 01/19/20 1350    Subjective "I've been practicing"                 ADULT SLP TREATMENT - 01/19/20 0001      General Information   Behavior/Cognition Alert;Cooperative;Pleasant mood      Treatment Provided   Treatment provided Cognitive-Linquistic      Pain Assessment   Pain Assessment No/denies pain      Cognitive-Linquistic Treatment   Treatment focused on Voice    Skilled Treatment Read aloud Harvard sentences at 70% intelligibility with reminders to speak loudly and slow rate of speech. Answered general questions and engaged in casual conversation. Given a verbal prompt and a story starter, he came up with the ending to a story at 75% intelligibility.      Assessment / Recommendations / Plan   Plan Continue with current plan of care      Progression Toward Goals   Progression toward goals Progressing toward goals             SLP Education - 01/19/20 1350    Education provided Yes    Education Details Loud, clear speech    Person(s) Educated Patient    Methods Explanation    Comprehension Verbalized understanding              SLP Long Term Goals - 01/10/20 1510      SLP LONG TERM GOAL #1   Title The patient will maximize voice quality, oral resonance, and loudness using breath support for sustained vowel production, pitch glides, and hierarchal speech drill.    Time 8    Period Weeks    Status New    Target Date 03/09/20      SLP LONG TERM GOAL #2   Title The patient will maximize voice quality, oral resonance, and loudness using breath support for paragraph length recitation with 80% accuracy.    Time 8    Period Weeks    Status New    Target Date 03/09/20      SLP LONG TERM GOAL #3   Title Patient and his wife will participate in developing functional compensatory strategies to improve engagement, activity, and social interactions.  Time 8    Period Weeks    Status New    Target Date 03/09/20            Plan - 01/19/20 1351    Clinical Impression Statement Patient is continuing to exercise his loud voice and become aware of his rate of speech. He requires frequent reminders to increase intelligibility. Will continue with reading and conversational tasks. Will also orient him to the iPad in future sessions.    Speech Therapy Frequency 2x / week    Duration Other (comment)    Treatment/Interventions Patient/family education;SLP instruction and feedback;Compensatory techniques    Potential to Achieve Goals Good    Potential Considerations Ability to learn/carryover information;Cooperation/participation level;Previous level of function;Severity of impairments;Family/community support;Other (comment)           Patient will benefit from skilled therapeutic intervention in order to improve the following deficits and impairments:   Dysphonia    Problem List Patient  Active Problem List   Diagnosis Date Noted  . Stroke ([place]) 01/27/2017  . High cholesterol 01/27/2017  . Hypertension 01/27/2017  . Carotid stenosis 01/27/2017  . Occlusion and stenosis of carotid artery with cerebral infarction 07/27/2013    Maylon Cos, Student Intern 01/19/2020, 1:51 PM  [place] MAIN [facility] SERVICES [address] [place], [place], [address] Phone: [phone]   Fax:  [phone]   Name: Tali Coster. MRN: [id number] Date of Birth: [birth year]

## 2020-01-24 ENCOUNTER — Ambulatory Visit: Payer: Medicare Other

## 2020-01-24 ENCOUNTER — Ambulatory Visit: Payer: Medicare Other | Admitting: Speech Pathology

## 2020-01-24 ENCOUNTER — Encounter: Payer: Self-pay | Admitting: Speech Pathology

## 2020-01-24 ENCOUNTER — Other Ambulatory Visit: Payer: Self-pay

## 2020-01-24 DIAGNOSIS — R2681 Unsteadiness on feet: Secondary | ICD-10-CM | POA: Diagnosis not present

## 2020-01-24 DIAGNOSIS — R490 Dysphonia: Secondary | ICD-10-CM

## 2020-01-24 NOTE — Therapy (Signed)
[place] MAIN [facility] SERVICES [address] [place], [place], [address] Phone: [phone]   Fax:  [phone]  Physical Therapy Treatment  Patient Details  Name: Troy Pugh. MRN: [id number] Date of Birth: 1940-12-15 Referring Provider (PT): Dr. Doy Hutching   Encounter Date: 01/24/2020   PT End of Session - 01/24/20 1027    Visit Number 17    Number of Visits 25    Date for PT Re-Evaluation 02/02/20    Authorization Type eval 6/16    Authorization Time Period FOTO- PT    PT Start Time 1025    Equipment Utilized During Treatment Gait belt    Activity Tolerance Patient tolerated treatment well    Behavior During Therapy WFL for tasks assessed/performed           Past Medical History:  Diagnosis Date   Carotid stent occlusion (HCC)    High cholesterol    Hypertension    Stroke [facility])     Past Surgical History:  Procedure Laterality Date   CATARACT EXTRACTION     GALLBLADDER SURGERY      There were no vitals filed for this visit.   Subjective Assessment - 01/24/20 1027    Subjective Pt is doing well today. Wife reports no worsening of behavior/irritability after stopping the Haldol/Abilify. She is in the process of arranging for a [facility]. No pain reported oupon arrival and L forearm skin flap continues to improve. No falls since last therapy session. No specific questions or concern.    Pertinent History Pt suffered a CVA 10 years ago. Due to recent worsening balance and falls his MD ordered PT. He started [facility] PT in January and had his last therapy session this Monday. Pt reports that he was working on balance and strengthening with therapy. He has been experiencing worsening L drop foot over the last year or so and now has an AFO which he picked up 2-3 weeks ago. Pt reports improvement in his gait since getting his AFO. He has had at least 6 falls in the last 12 months. He initially had PT at [facility] after his CVA but was doing  relatively well until recently. He has a history of vascular dementia and started Aricept last night. History is felt to be unreliable from patient and most information is provided by his wife.    Patient Stated Goals Improve balance, gait, and decrease risk for falls    Currently in Pain? No/denies             TREATMENT   Ther-ex NuStep L4 x 4 minutes for warm-up during history (3 minutes unbilled); Precor BLE leg press 115# x 20, 130# x 20; Sit to stand with 3kg overhead med ball press 2 x 10;   Neuromuscular Re-education  2kg med ball chest passes on firm surface with student PT x 10; 3kg med ball chest passes on firm surface with student PT x 10; 3kg med ball chest passes on Airex pad WBOS with student PT x 10; 3kg med ball chest passes on Airex pad NBOS with student PT x 10; Basketball tosses into hoop NBOS Airex x 5 minutes, therapist has to cue pt to slow down to focus on tosses Forward stepping over 1/2 foam roller without UE support alternating leading LE x 10 on each side; Gait in hallway with horizontal basketball bounce passes to therapist x 75' each direction; Gait in hallway without single point cane, repeated cues and education about practicing  knee extension prior to initial contact on the left and increasing L step length to improve heel strike. Counted steps over distance and challenged pt with each repeated trial to lower total number of steps in order to encourage increased step length;    Pt educated throughout session about proper posture and technique with exercises. Improved exercise technique, movement at target joints, use of target muscles after min to mod verbal, visual, tactile cues.    Pt demonstrates excellent motivation during session today. Continued with LE strengthening but most of session today focused on balance exercises. Pt demonstrates impulsivity during basketball tosses and also pt struggles with dual tasking during gait with lateral  bounce passes. He does better today with large amplitude stepping in the parallel bars as well as during gait in the hallway. Reinitiated leg press today but focused on double instead of single leg due to wife's concerns about possibly overworking LLE. Pt encouraged to continue HEP and follow-up as scheduled. Pt will benefit from PT services to address deficits in strength, balance, and mobility in order to return to full function at home.                            PT Short Term Goals - 12/14/19 1727      PT SHORT TERM GOAL #1   Title Pt will be independent with HEP in order to improve strength and balance in order to decrease fall risk and improve function at home.    Baseline 6/16: HEP compliant most days    Time 6    Period Weeks    Status Partially Met    Target Date 12/22/19             PT Long Term Goals - 12/14/19 1727      PT LONG TERM GOAL #1   Title Pt will improve BERG by at least 3 points in order to demonstrate clinically significant improvement in balance.    Baseline 11/10/19: 51/56 6/16: 53/56    Time 12    Period Weeks    Status Partially Met    Target Date 02/02/20      PT LONG TERM GOAL #2   Title Pt will improve ABC by at least 13% in order to demonstrate clinically significant improvement in balance confidence.    Baseline 11/10/19: 43.125% 6/16: 49%    Time 12    Period Weeks    Status Partially Met    Target Date 02/02/20      PT LONG TERM GOAL #3   Title Pt will improve FOTO to at least 68 in order to demonstrate significant improvement in function at home.    Baseline 11/10/19: 58 6/16: 50    Time 12    Period Weeks    Status On-going    Target Date 02/02/20                 Plan - 01/24/20 1120    Clinical Impression Statement Pt demonstrates excellent motivation during session today. Continued with LE strengthening but most of session today focused on balance exercises. Pt demonstrates impulsivity during basketball  tosses and also pt struggles with dual tasking during gait with lateral bounce passes. He does better today with large amplitude stepping in the parallel bars as well as during gait in the hallway. Reinitiated leg press today but focused on double instead of single leg due to wife's concerns about possibly overworking LLE. Pt  encouraged to continue HEP and follow-up as scheduled. Pt will benefit from PT services to address deficits in strength, balance, and mobility in order to return to full function at home.    Personal Factors and Comorbidities Age;Comorbidity 2    Comorbidities CVA, vascular dementia    Examination-Activity Limitations Locomotion Level;Transfers    Examination-Participation Restrictions Community Activity;Shop    Stability/Clinical Decision Making Evolving/Moderate complexity    Rehab Potential Fair    PT Frequency 2x / week    PT Duration 12 weeks    PT Treatment/Interventions ADLs/Self Care Home Management;Aquatic Therapy;Biofeedback;Cryotherapy;Canalith Repostioning;Electrical Stimulation;Iontophoresis 46m/ml Dexamethasone;Moist Heat;Traction;Ultrasound;DME Instruction;Gait training;Stair training;Functional mobility training;Therapeutic activities;Therapeutic exercise;Balance training;Neuromuscular re-education;Cognitive remediation;Patient/family education;Manual techniques;Passive range of motion;Dry needling;Vestibular    PT Next Visit Plan Review HEP, continue with strength and balance    PT Home Exercise Plan Medbridge Access Code: [email or web address](clams, marches, sit to stand green tband, tandem balance, gastroc stretch);    Consulted and Agree with Plan of Care Patient;Family member/caregiver    Family Member Consulted Wife           Patient will benefit from skilled therapeutic intervention in order to improve the following deficits and impairments:  Abnormal gait, Decreased balance, Decreased safety awareness, Decreased knowledge of precautions, Decreased cognition,  Decreased mobility  Visit Diagnosis: Unsteadiness on feet     Problem List Patient Active Problem List   Diagnosis Date Noted   [place]   High cholesterol 01/27/2017   Hypertension 01/27/2017   Carotid stenosis 01/27/2017   Occlusion and stenosis of carotid artery with cerebral infarction 07/27/2013   JPhillips GroutPT, DPT, GCS  Terah Robey 01/24/2020, 11:33 AM  [place]RC[address] [place] [address]Phone: [phone]  Fax:  [phone] Name: HEaven Schwager MRN: [id number] of Birth: 1Jul 27, 1942

## 2020-01-24 NOTE — Therapy (Signed)
[place] MAIN [facility] SERVICES [address] [place], [place], [address] Phone: [phone]   Fax:  [phone]  Speech Language Pathology Treatment  Patient Details  Name: Troy Pugh. MRN: [id number] Date of Birth: May 16, 1941 Referring Provider (SLP): Dr. Manuella Ghazi   Encounter Date: 01/24/2020   End of Session - 01/24/20 1237    Visit Number 4    Number of Visits 17    Date for SLP Re-Evaluation 03/09/20    Authorization Type Medicare    Authorization Time Period Start 01/09/2020    Authorization - Visit Number 4    Progress Note Due on Visit 10    SLP Start Time 1100    SLP Stop Time  1145    SLP Time Calculation (min) 45 min    Activity Tolerance Patient tolerated treatment well           Past Medical History:  Diagnosis Date  . Carotid stent occlusion ([place])   . High cholesterol   . Hypertension   . Stroke [facility])     Past Surgical History:  Procedure Laterality Date  . CATARACT EXTRACTION    . GALLBLADDER SURGERY      There were no vitals filed for this visit.   Subjective Assessment - 01/24/20 1236    Subjective "I sounded better at the end"                 ADULT SLP TREATMENT - 01/24/20 0001      General Information   Behavior/Cognition Alert;Cooperative;Pleasant mood      Treatment Provided   Treatment provided Cognitive-Linquistic      Pain Assessment   Pain Assessment No/denies pain      Cognitive-Linquistic Treatment   Treatment focused on Voice    Skilled Treatment Practiced 20 loud "ahs" with good quality for an average of 4 seconds. Described details of the scene in a picture at 75% intelligibility. Read aloud Harvard sentences at 65% intelligibility with frequent cueing to stay loud. Engaged in casual conversation at 80% intelligibility.      Assessment / Recommendations / Plan   Plan Continue with current plan of care      Progression Toward Goals   Progression toward goals Progressing  toward goals            SLP Education - 01/24/20 1236    Education provided Yes    Education Details Loud speech, slow rate    Person(s) Educated Patient    Methods Explanation    Comprehension Verbalized understanding              SLP Long Term Goals - 01/10/20 1510      SLP LONG TERM GOAL #1   Title The patient will maximize voice quality, oral resonance, and loudness using breath support for sustained vowel production, pitch glides, and hierarchal speech drill.    Time 8    Period Weeks    Status New    Target Date 03/09/20      SLP LONG TERM GOAL #2   Title The patient will maximize voice quality, oral resonance, and loudness using breath support for paragraph length recitation with 80% accuracy.    Time 8    Period Weeks    Status New    Target Date 03/09/20      SLP LONG TERM GOAL #3   Title Patient and his wife will participate in developing functional compensatory strategies to improve engagement, activity, and social interactions.  Time 8    Period Weeks    Status New    Target Date 03/09/20            Plan - 01/24/20 1237    Clinical Impression Statement Patient demonstrates higher intelligibility in conversation compared to reading. Requires frequent cueing when reading to stay loud and slow rate of speech. Will continue to practice using the loud voice in future sessions to decrease hypernasality.    Speech Therapy Frequency 2x / week    Duration Other (comment)    Treatment/Interventions Patient/family education;SLP instruction and feedback;Compensatory techniques    Potential to Achieve Goals Good    Potential Considerations Ability to learn/carryover information;Cooperation/participation level;Previous level of function;Severity of impairments;Family/community support;Other (comment)           Patient will benefit from skilled therapeutic intervention in order to improve the following deficits and impairments:   Dysphonia    Problem  List Patient Active Problem List   Diagnosis Date Noted  . Stroke ([place]) 01/27/2017  . High cholesterol 01/27/2017  . Hypertension 01/27/2017  . Carotid stenosis 01/27/2017  . Occlusion and stenosis of carotid artery with cerebral infarction 07/27/2013    Maylon Cos, Student Intern 01/24/2020, 12:38 PM  [place] MAIN [facility] SERVICES [address] [place], [place], [address] Phone: [phone]   Fax:  [phone]   Name: Troy Pugh. MRN: [id number] Date of Birth: 1940/11/15

## 2020-01-26 ENCOUNTER — Encounter: Payer: Self-pay | Admitting: Speech Pathology

## 2020-01-26 ENCOUNTER — Ambulatory Visit: Payer: Medicare Other | Admitting: Speech Pathology

## 2020-01-26 ENCOUNTER — Ambulatory Visit: Payer: Medicare Other

## 2020-01-26 ENCOUNTER — Other Ambulatory Visit: Payer: Self-pay

## 2020-01-26 DIAGNOSIS — R2681 Unsteadiness on feet: Secondary | ICD-10-CM

## 2020-01-26 DIAGNOSIS — R490 Dysphonia: Secondary | ICD-10-CM

## 2020-01-26 DIAGNOSIS — Z9181 History of falling: Secondary | ICD-10-CM

## 2020-01-26 NOTE — Therapy (Signed)
[place] MAIN [facility] SERVICES [address] [place], [place], [address] Phone: [phone]   Fax:  [phone]  Speech Language Pathology Treatment  Patient Details  Name: Troy Pugh. MRN: [id number] Date of Birth: 02-13-1941 Referring Provider (SLP): Dr. Manuella Ghazi   Encounter Date: 01/26/2020   End of Session - 01/26/20 1233    Visit Number 5    Number of Visits 17    Date for SLP Re-Evaluation 03/09/20    Authorization Type Medicare    Authorization Time Period Start 01/09/2020    Authorization - Visit Number 5    Progress Note Due on Visit 10    SLP Start Time 1050    SLP Stop Time  1140    SLP Time Calculation (min) 50 min    Activity Tolerance Patient tolerated treatment well           Past Medical History:  Diagnosis Date  . Carotid stent occlusion ([place])   . High cholesterol   . Hypertension   . Stroke [facility])     Past Surgical History:  Procedure Laterality Date  . CATARACT EXTRACTION    . GALLBLADDER SURGERY      There were no vitals filed for this visit.   Subjective Assessment - 01/26/20 1233    Subjective Hard-working, motivated                 ADULT SLP TREATMENT - 01/26/20 0001      General Information   Behavior/Cognition Alert;Cooperative;Pleasant mood      Treatment Provided   Treatment provided Cognitive-Linquistic      Pain Assessment   Pain Assessment No/denies pain      Cognitive-Linquistic Treatment   Treatment focused on Voice    Skilled Treatment VOICE: Described incongruencies in pictures at 80% intelligibility. Read aloud Harvard sentences at 70% intelligibility with moderate cueing to remain loud and speak clearly. Answered general questions and engaged in casual conversation at 90% intelligibility. EDUCATION: Provided patient with several word games to download on his iPad at home.      Assessment / Recommendations / Plan   Plan Continue with current plan of care       Progression Toward Goals   Progression toward goals Progressing toward goals            SLP Education - 01/26/20 1233    Education provided Yes    Education Details Word games    Person(s) Educated Patient    Methods Explanation;Demonstration    Comprehension Verbalized understanding              SLP Long Term Goals - 01/10/20 1510      SLP LONG TERM GOAL #1   Title The patient will maximize voice quality, oral resonance, and loudness using breath support for sustained vowel production, pitch glides, and hierarchal speech drill.    Time 8    Period Weeks    Status New    Target Date 03/09/20      SLP LONG TERM GOAL #2   Title The patient will maximize voice quality, oral resonance, and loudness using breath support for paragraph length recitation with 80% accuracy.    Time 8    Period Weeks    Status New    Target Date 03/09/20      SLP LONG TERM GOAL #3   Title Patient and his wife will participate in developing functional compensatory strategies to improve engagement, activity, and social interactions.  Time 8    Period Weeks    Status New    Target Date 03/09/20            Plan - 01/26/20 1234    Clinical Impression Statement Patient is working hard to increase volume and overall intelligibility of speech. He recognizes when he is more difficult to understand and knows which strategies to use to improve speech. Will continue to work on more consistent use of his loud, clear voice.    Speech Therapy Frequency 2x / week    Duration Other (comment)    Treatment/Interventions Patient/family education;SLP instruction and feedback;Compensatory techniques    Potential to Achieve Goals Good    Potential Considerations Ability to learn/carryover information;Cooperation/participation level;Previous level of function;Severity of impairments;Family/community support;Other (comment)           Patient will benefit from skilled therapeutic intervention in order to  improve the following deficits and impairments:   Dysphonia    Problem List Patient Active Problem List   Diagnosis Date Noted  . Stroke ([place]) 01/27/2017  . High cholesterol 01/27/2017  . Hypertension 01/27/2017  . Carotid stenosis 01/27/2017  . Occlusion and stenosis of carotid artery with cerebral infarction 07/27/2013    Maylon Cos, Student Intern 01/26/2020, 12:34 PM  [place] MAIN [facility] SERVICES [address] [place], [place], [address] Phone: [phone]   Fax:  [phone]   Name: Troy Pugh. MRN: [id number] Date of Birth: Feb 20, 1941

## 2020-01-26 NOTE — Therapy (Signed)
[place] MAIN [facility] SERVICES [address] [place], [place], [address] Phone: [phone]   Fax:  [phone]  Physical Therapy Treatment  Patient Details  Name: Troy Pugh. MRN: [id number] Date of Birth: [birth year] Referring Provider (PT): Dr. Doy Hutching   Encounter Date: 01/26/2020   PT End of Session - 01/26/20 1141    Visit Number 18    Number of Visits 25    Date for PT Re-Evaluation 02/02/20    Authorization Type eval 6/16    Authorization Time Period FOTO- PT    PT Start Time 1145    PT Stop Time 1230    PT Time Calculation (min) 45 min    Equipment Utilized During Treatment Gait belt    Activity Tolerance Patient tolerated treatment well    Behavior During Therapy WFL for tasks assessed/performed           Past Medical History:  Diagnosis Date  . Carotid stent occlusion ([place])   . High cholesterol   . Hypertension   . Stroke [facility])     Past Surgical History:  Procedure Laterality Date  . CATARACT EXTRACTION    . GALLBLADDER SURGERY      There were no vitals filed for this visit.   Subjective Assessment - 01/26/20 1141    Subjective Pt is doing well today. No increase soreness or pain from the leg press last session. Wife reports the [facility] Aid has been working well. No pain reported oupon arrival and L forearm skin flap continues to improve. No falls since last therapy session. No specific questions or concern.    Pertinent History Pt suffered a CVA 10 years ago. Due to recent worsening balance and falls his MD ordered PT. He started [facility] PT in January and had his last therapy session this Monday. Pt reports that he was working on balance and strengthening with therapy. He has been experiencing worsening L drop foot over the last year or so and now has an AFO which he picked up 2-3 weeks ago. Pt reports improvement in his gait since getting his AFO. He has had at least 6 falls in the last 12 months. He initially had PT at  [facility] after his CVA but was doing relatively well until recently. He has a history of vascular dementia and started Aricept last night. History is felt to be unreliable from patient and most information is provided by his wife.    Patient Stated Goals Improve balance, gait, and decrease risk for falls    Currently in Pain? No/denies             TREATMENT   Ther-ex  NuStep L4 x 5 minutes for warm-up during history (2 minutes unbilled); Sit to stand with 3kg overhead med ball press 2 x 10;    Neuromuscular Re-education  Step ups on 6" step x 10 each LE; Throwing 1# ankle weights into empty bins x multiple bouts, cued to use left hand and take a step; Bowling with cones, cued for lower/softer throws, bent down to pick up balls from the floor 5 times with CGA; Agility ladder forward stepping x 4 lengths, extensive cues for step length for one foot in each box; Agility ladder sidestepping x 1 length, modA+1 assist to as he leans over to R side to pick up L foot; cues to pick up the L freezing foot; Bosu lunges x 10 each leg, cues to pick up left foot.    Pt educated  throughout session about proper posture and technique with exercises. Improved exercise technique, movement at target joints, use of target muscles after min to mod verbal, visual, tactile cues.      Pt demonstrates excellent motivation during session today. Session focused on a combination of strengthening and balance exercises. Patient did well with throwing the weights into the bin, throwing distance increased as he was consistently making it in the bin. Pt was dragging L foot more today and cued to pick up the leg. Added exercises to help with picking up L foot. During bowling exercise, patient was able to pick up the balls from the floor safely. Pt encouraged to continue HEP and follow-up as scheduled. Pt will benefit from PT services to address deficits in strength, balance, and mobility in order to return to full function  at home.           PT Short Term Goals - 12/14/19 1727      PT SHORT TERM GOAL #1   Title Pt will be independent with HEP in order to improve strength and balance in order to decrease fall risk and improve function at home.    Baseline 6/16: HEP compliant most days    Time 6    Period Weeks    Status Partially Met    Target Date 12/22/19             PT Long Term Goals - 12/14/19 1727      PT LONG TERM GOAL #1   Title Pt will improve BERG by at least 3 points in order to demonstrate clinically significant improvement in balance.    Baseline 11/10/19: 51/56 6/16: 53/56    Time 12    Period Weeks    Status Partially Met    Target Date 02/02/20      PT LONG TERM GOAL #2   Title Pt will improve ABC by at least 13% in order to demonstrate clinically significant improvement in balance confidence.    Baseline 11/10/19: 43.125% 6/16: 49%    Time 12    Period Weeks    Status Partially Met    Target Date 02/02/20      PT LONG TERM GOAL #3   Title Pt will improve FOTO to at least 68 in order to demonstrate significant improvement in function at home.    Baseline 11/10/19: 58 6/16: 50    Time 12    Period Weeks    Status On-going    Target Date 02/02/20                 Plan - 01/26/20 1142    Clinical Impression Statement Pt demonstrates excellent motivation during session today. Session focused on a combination of strengthening and balance exercises. Patient did well with throwing the weights into the bin, throwing distance increased as he was consistently making it in the bin. Pt was dragging L foot more today and cued to pick up the leg. Added exercises to help with picking up L foot. During bowling exercise, patient was able to pick up the balls from the floor safely. Pt encouraged to continue HEP and follow-up as scheduled. Pt will benefit from PT services to address deficits in strength, balance, and mobility in order to return to full function at home.    Personal  Factors and Comorbidities Age;Comorbidity 2    Comorbidities CVA, vascular dementia    Examination-Activity Limitations Locomotion Level;Transfers    Examination-Participation Restrictions Community Activity;Shop    Stability/Clinical Decision  Making Evolving/Moderate complexity    Rehab Potential Fair    PT Frequency 2x / week    PT Duration 12 weeks    PT Treatment/Interventions ADLs/Self Care Home Management;Aquatic Therapy;Biofeedback;Cryotherapy;Canalith Repostioning;Electrical Stimulation;Iontophoresis 71m/ml Dexamethasone;Moist Heat;Traction;Ultrasound;DME Instruction;Gait training;Stair training;Functional mobility training;Therapeutic activities;Therapeutic exercise;Balance training;Neuromuscular re-education;Cognitive remediation;Patient/family education;Manual techniques;Passive range of motion;Dry needling;Vestibular    PT Next Visit Plan Review HEP, continue with strength and balance    PT Home Exercise Plan Medbridge Access Code: [email or web address](clams, marches, sit to stand green tband, tandem balance, gastroc stretch);    Consulted and Agree with Plan of Care Patient;Family member/caregiver    Family Member Consulted Wife           Patient will benefit from skilled therapeutic intervention in order to improve the following deficits and impairments:  Abnormal gait, Decreased balance, Decreased safety awareness, Decreased knowledge of precautions, Decreased cognition, Decreased mobility  Visit Diagnosis: Unsteadiness on feet  History of falling     Problem List Patient Active Problem List   Diagnosis Date Noted  . [place] 01/27/2017  . High cholesterol 01/27/2017  . Hypertension 01/27/2017  . Carotid stenosis 01/27/2017  . Occlusion and stenosis of carotid artery with cerebral infarction 07/27/2013   This entire session was performed under direct supervision and direction of a licensed therapist/therapist assistant . I have personally read, edited and approve of the  note as written.   KNoemi Chapel SPT JPhillips GroutPT, DPT, GCS  Huprich,Jason 01/26/2020, 2:32 PM  [place] [facility] [address] [address]Phone: [phone]  Fax:  [phone] Name: HNoal Abshier MRN: [id number] of Birth: 102/11/1940

## 2020-01-30 ENCOUNTER — Ambulatory Visit: Payer: Medicare Other | Attending: Neurology

## 2020-01-30 ENCOUNTER — Ambulatory Visit: Payer: Medicare Other | Admitting: Speech Pathology

## 2020-01-30 ENCOUNTER — Encounter: Payer: Self-pay | Admitting: Speech Pathology

## 2020-01-30 ENCOUNTER — Other Ambulatory Visit: Payer: Self-pay

## 2020-01-30 DIAGNOSIS — R41841 Cognitive communication deficit: Secondary | ICD-10-CM

## 2020-01-30 DIAGNOSIS — R2681 Unsteadiness on feet: Secondary | ICD-10-CM | POA: Insufficient documentation

## 2020-01-30 DIAGNOSIS — Z9181 History of falling: Secondary | ICD-10-CM | POA: Insufficient documentation

## 2020-01-30 DIAGNOSIS — R490 Dysphonia: Secondary | ICD-10-CM

## 2020-01-30 NOTE — Therapy (Signed)
[place] MAIN [facility] SERVICES [address] [place], [place], [address] Phone: [phone]   Fax:  [phone]  Speech Language Pathology Treatment  Patient Details  Name: Troy Pugh. MRN: [id number] Date of Birth: 03-04-1941 Referring Provider (SLP): Dr. Manuella Ghazi   Encounter Date: 01/30/2020   End of Session - 01/30/20 1208    Visit Number 6    Number of Visits 17    Date for SLP Re-Evaluation 03/09/20    Authorization Type Medicare    Authorization Time Period Start 01/09/2020    Authorization - Visit Number 6    Progress Note Due on Visit 10    SLP Start Time 1100    SLP Stop Time  1150    SLP Time Calculation (min) 50 min    Activity Tolerance Patient tolerated treatment well           Past Medical History:  Diagnosis Date   Carotid stent occlusion ([facility])    High cholesterol    Hypertension    Stroke [facility])     Past Surgical History:  Procedure Laterality Date   CATARACT EXTRACTION     GALLBLADDER SURGERY      There were no vitals filed for this visit.   Subjective Assessment - 01/30/20 1208    Subjective Hard-working, motivated    Patient is accompained by: Family member                 ADULT SLP TREATMENT - 01/30/20 0001      General Information   Behavior/Cognition Alert;Cooperative;Pleasant mood      Treatment Provided   Treatment provided Cognitive-Linquistic      Pain Assessment   Pain Assessment No/denies pain      Cognitive-Linquistic Treatment   Treatment focused on Voice    Skilled Treatment Loud "Ah": Sustain loud, good vocal quality vowel for up to 20 seconds.  Maintain intelligibility at 80%+ for conversation, reading, and structured expression tasks (40+ minutes) given min cues.      Assessment / Recommendations / Plan   Plan Continue with current plan of care      Progression Toward Goals   Progression toward goals Progressing toward goals            SLP Education -  01/30/20 1208    Education Details Loud and slow speech    Person(s) Educated Patient;Spouse    Methods Explanation    Comprehension Verbalized understanding              SLP Long Term Goals - 01/10/20 1510      SLP LONG TERM GOAL #1   Title The patient will maximize voice quality, oral resonance, and loudness using breath support for sustained vowel production, pitch glides, and hierarchal speech drill.    Time 8    Period Weeks    Status New    Target Date 03/09/20      SLP LONG TERM GOAL #2   Title The patient will maximize voice quality, oral resonance, and loudness using breath support for paragraph length recitation with 80% accuracy.    Time 8    Period Weeks    Status New    Target Date 03/09/20      SLP LONG TERM GOAL #3   Title Patient and his wife will participate in developing functional compensatory strategies to improve engagement, activity, and social interactions.    Time 8    Period Weeks    Status New  Target Date 03/09/20            Plan - 01/30/20 1209    Clinical Impression Statement Patient is working hard to increase volume and overall intelligibility of speech. He recognizes when he is more difficult to understand and knows which strategies to use to improve speech. Will continue to work on more consistent use of his loud, clear voice.    Speech Therapy Frequency 2x / week    Duration Other (comment)    Treatment/Interventions Patient/family education;SLP instruction and feedback;Compensatory techniques    Potential to Achieve Goals Good    Potential Considerations Ability to learn/carryover information;Cooperation/participation level;Previous level of function;Severity of impairments;Family/community support;Other (comment)    Consulted and Agree with Plan of Care Patient;Family member/caregiver    Family Member Consulted Spouse           Patient will benefit from skilled therapeutic intervention in order to improve the following  deficits and impairments:   Dysphonia  Cognitive communication deficit    Problem List Patient Active Problem List   Diagnosis Date Noted   Stroke ([place]) 01/27/2017   High cholesterol 01/27/2017   Hypertension 01/27/2017   Carotid stenosis 01/27/2017   Occlusion and stenosis of carotid artery with cerebral infarction 07/27/2013   [name], MS/CCC- SLP  [name] 01/30/2020, 12:10 PM  [place] [address] [place], [place], [address] Phone: [phone]   Fax:  [phone]   Name: Troy Pugh. MRN: [id number] Date of Birth: Jun 06, 1941

## 2020-01-30 NOTE — Therapy (Signed)
[place] MAIN [facility] SERVICES [address] [place], [place], [address] Phone: [phone]   Fax:  [phone]  Physical Therapy Treatment  Patient Details  Name: Troy Pugh. MRN: [id number] Date of Birth: Mar 20, 1941 Referring Provider (PT): Dr. Doy Hutching   Encounter Date: 01/30/2020   PT End of Session - 01/30/20 1004    Visit Number 19    Number of Visits 25    Date for PT Re-Evaluation 02/02/20    Authorization Type eval 6/16    Authorization Time Period FOTO- PT    PT Start Time 1015    PT Stop Time 1100    PT Time Calculation (min) 45 min    Equipment Utilized During Treatment Gait belt    Activity Tolerance Patient tolerated treatment well    Behavior During Therapy WFL for tasks assessed/performed           Past Medical History:  Diagnosis Date  . Carotid stent occlusion ([place])   . High cholesterol   . Hypertension   . Stroke [facility])     Past Surgical History:  Procedure Laterality Date  . CATARACT EXTRACTION    . GALLBLADDER SURGERY      There were no vitals filed for this visit.   Subjective Assessment - 01/30/20 1004    Subjective Pt is doing well today although wife states that he woke up in a worse mood today than yesterday. No increase soreness or pain from the leg press. Wife reports the [facility] Aid has been working well. No pain reported opon arrival. No falls since last therapy session. No specific questions or concern.    Pertinent History Pt suffered a CVA 10 years ago. Due to recent worsening balance and falls his MD ordered PT. He started [facility] PT in January and had his last therapy session this Monday. Pt reports that he was working on balance and strengthening with therapy. He has been experiencing worsening L drop foot over the last year or so and now has an AFO which he picked up 2-3 weeks ago. Pt reports improvement in his gait since getting his AFO. He has had at least 6 falls in the last 12 months. He initially  had PT at [facility] after his CVA but was doing relatively well until recently. He has a history of vascular dementia and started Aricept last night. History is felt to be unreliable from patient and most information is provided by his wife.    Patient Stated Goals Improve balance, gait, and decrease risk for falls    Currently in Pain? No/denies             TREATMENT   Ther-ex  Octane xRide L4-5 x 5 minutes for warm-up during history (2 minutes unbilled); Precor BLE leg press 130# x 20, 135# x 20; Sit to stand with 4kg overhead med ball press 2 x 10; Squats with BUE support on rails x 10;  Standing exercises with 5# ankle weights: Marches x 15 BLE; Hip abduction x 15 BLE;   Neuromuscular Re-education  Seated boxing with cross and jab punches; Standing boxing with cross, jab, and uppercut punches; Standing boxing with therapist calling out laterality and punch type to challenge both balance and cognition; Step ups on 6" step without UE support x 10 each LE, occasional verbal and tactile cues required; Obstacle course in // bars with step over 1/2 foam rollers, step up/down 6" step, and step up/down Airex pad x 4 trials; Alternating 6"  step taps without UE support  Airex alternating 6" step taps without UE support     Pt educated throughout session about proper posture and technique with exercises. Improved exercise technique, movement at target joints, use of target muscles after min to mod verbal, visual, tactile cues.      Pt demonstrates excellent motivation during session today. Session focused on a combination of strengthening and balance exercises. Initiated boxing with patient for challenge to cardiolvascular system, balance, and cognition. Increased med ball weight for sit to stand with overhead presses and also performed and obstacle course in the parallel bars to challenge his balance.  Pt encouraged to continue HEP and follow-up as scheduled. Pt will benefit from PT  services to address deficits in strength, balance, and mobility in order to return to full function at home.                            PT Short Term Goals - 12/14/19 1727      PT SHORT TERM GOAL #1   Title Pt will be independent with HEP in order to improve strength and balance in order to decrease fall risk and improve function at home.    Baseline 6/16: HEP compliant most days    Time 6    Period Weeks    Status Partially Met    Target Date 12/22/19             PT Long Term Goals - 12/14/19 1727      PT LONG TERM GOAL #1   Title Pt will improve BERG by at least 3 points in order to demonstrate clinically significant improvement in balance.    Baseline 11/10/19: 51/56 6/16: 53/56    Time 12    Period Weeks    Status Partially Met    Target Date 02/02/20      PT LONG TERM GOAL #2   Title Pt will improve ABC by at least 13% in order to demonstrate clinically significant improvement in balance confidence.    Baseline 11/10/19: 43.125% 6/16: 49%    Time 12    Period Weeks    Status Partially Met    Target Date 02/02/20      PT LONG TERM GOAL #3   Title Pt will improve FOTO to at least 68 in order to demonstrate significant improvement in function at home.    Baseline 11/10/19: 58 6/16: 50    Time 12    Period Weeks    Status On-going    Target Date 02/02/20                 Plan - 01/30/20 1004    Clinical Impression Statement Pt demonstrates excellent motivation during session today. Session focused on a combination of strengthening and balance exercises. Initiated boxing with patient for challenge to cardiolvascular system, balance, and cognition. Increased med ball weight for sit to stand with overhead presses and also performed and obstacle course in the parallel bars to challenge his balance.  Pt encouraged to continue HEP and follow-up as scheduled. Pt will benefit from PT services to address deficits in strength, balance, and mobility in  order to return to full function at home.    Personal Factors and Comorbidities Age;Comorbidity 2    Comorbidities CVA, vascular dementia    Examination-Activity Limitations Locomotion Level;Transfers    Examination-Participation Restrictions Community Activity;Shop    Stability/Clinical Decision Making Evolving/Moderate complexity    Rehab  Potential Fair    PT Frequency 2x / week    PT Duration 12 weeks    PT Treatment/Interventions ADLs/Self Care Home Management;Aquatic Therapy;Biofeedback;Cryotherapy;Canalith Repostioning;Electrical Stimulation;Iontophoresis 57m/ml Dexamethasone;Moist Heat;Traction;Ultrasound;DME Instruction;Gait training;Stair training;Functional mobility training;Therapeutic activities;Therapeutic exercise;Balance training;Neuromuscular re-education;Cognitive remediation;Patient/family education;Manual techniques;Passive range of motion;Dry needling;Vestibular    PT Next Visit Plan Review HEP, continue with strength and balance    PT Home Exercise Plan Medbridge Access Code: [email or web address](clams, marches, sit to stand green tband, tandem balance, gastroc stretch);    Consulted and Agree with Plan of Care Patient;Family member/caregiver    Family Member Consulted Wife           Patient will benefit from skilled therapeutic intervention in order to improve the following deficits and impairments:  Abnormal gait, Decreased balance, Decreased safety awareness, Decreased knowledge of precautions, Decreased cognition, Decreased mobility  Visit Diagnosis: Unsteadiness on feet  History of falling     Problem List Patient Active Problem List   Diagnosis Date Noted  . Stroke ([place] 01/27/2017  . High cholesterol 01/27/2017  . Hypertension 01/27/2017  . Carotid stenosis 01/27/2017  . Occlusion and stenosis of carotid artery with cerebral infarction 07/27/2013   JLyndel SafeHuprich PT, DPT, GCS  [place] 01/30/2020, 11:42 AM  [place] [facility] [address] [address]Phone: [phone]  Fax:  [phone] Name: HLadon Vandenberghe MRN: [id number] of Birth: [birth year]

## 2020-02-01 ENCOUNTER — Ambulatory Visit: Payer: Medicare Other

## 2020-02-01 ENCOUNTER — Encounter: Payer: Self-pay | Admitting: Speech Pathology

## 2020-02-01 ENCOUNTER — Ambulatory Visit: Payer: Medicare Other | Admitting: Speech Pathology

## 2020-02-01 ENCOUNTER — Other Ambulatory Visit: Payer: Self-pay

## 2020-02-01 DIAGNOSIS — R41841 Cognitive communication deficit: Secondary | ICD-10-CM

## 2020-02-01 DIAGNOSIS — R490 Dysphonia: Secondary | ICD-10-CM

## 2020-02-01 DIAGNOSIS — Z9181 History of falling: Secondary | ICD-10-CM

## 2020-02-01 DIAGNOSIS — R2681 Unsteadiness on feet: Secondary | ICD-10-CM | POA: Diagnosis not present

## 2020-02-01 NOTE — Therapy (Signed)
[place] MAIN [facility] SERVICES [address] [place], [place], [address] Phone: [phone]   Fax:  [phone]  Physical Therapy Treatment / Progress Note/Recertification Dates of reporting period  12/14/19   to   02/01/20  Patient Details  Name: Troy Pugh. MRN: [id number] Date of Birth: 23-Jul-1940 Referring Provider (PT): Dr. Doy Hutching   Encounter Date: 02/01/2020   PT End of Session - 02/01/20 1212    Visit Number 20    Number of Visits 37    Date for PT Re-Evaluation 03/14/20    Authorization Type eval 6/16, progress note / re-certifcation: 5/0/27    Authorization Time Period FOTO- PT    PT Start Time 1115    PT Stop Time 1200    PT Time Calculation (min) 45 min    Equipment Utilized During Treatment Gait belt    Activity Tolerance Patient tolerated treatment well    Behavior During Therapy WFL for tasks assessed/performed           Past Medical History:  Diagnosis Date  . 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 High cholesterol   . Hypertension   . Stroke [facility])     Past Surgical History:  Procedure Laterality Date  . CATARACT EXTRACTION    . GALLBLADDER SURGERY      There were no vitals filed for this visit.   Subjective Assessment - 02/01/20 1118    Subjective Pt is doing well today. No pain reported upon arrival. No falls since last therapy session. No specific questions or concern.    Pertinent History Pt suffered a CVA 10 years ago. Due to recent worsening balance and falls his MD ordered PT. He started [facility] PT in January and had his last therapy session this Monday. Pt reports that he was working on balance and strengthening with therapy. He has been experiencing worsening L drop foot over the last year or so and now has an AFO which he picked up 2-3 weeks ago. Pt reports improvement in his gait since getting his AFO. He has had at least 6 falls in the last 12 months. He initially had PT at [facility] after his CVA but was doing  relatively well until recently. He has a history of vascular dementia and started Aricept last night. History is felt to be unreliable from patient and most information is provided by his wife.    Patient Stated Goals Improve balance, gait, and decrease risk for falls    Currently in Pain? No/denies             TREATMENT  Updates goals and re-tested outcome measure: BERG: 48/56 ABC: 40.625% FOTO: 50   Neuromuscular Re-education Octane xRide L4-5 x 5 minutes for warm-up during history (2 minutes unbilled). Steps up on 6" step x 10 each leg, cues to pick up left foot; Standing on foam pad and step taps on 6" step, cues for no UE support and cues to pick up L foot; Obstacle course with foam pads, hurdle step, and foam roller x 8 rounds, cues to go slowly and pick up the left foot and take big steps 6" hurdle step x 20 each step with BUE support, verbal and tactile cues used for picking up L foot; Sit to stands with 2kg ball and foam underneath feet, cues used for controlled descent.  Pt educated throughout session about proper posture and technique with exercises. Improved exercise technique, movement at target joints, use of target muscles after min  to mod verbal, visual, tactile cues.  Pt demonstrates excellent motivation during session today. Updated goals today. Patient performed scored a 48/53 on Berg, 40.625% on ABC, and 50 on FOTO. Berg, ABC, and FOTO are worse than previous progress note. Over the past month, he had changes in medication and had a week where he was falling frequently, which might contribute to his lower scores. Session focused on balance and some strength exercises. Patient cued to pick the left foot up and take big steps throughout session. Next session will focus on strengthening the LE and continue balance exercises. Pt encouraged to continue HEP and follow-up as scheduled. Pt will benefit from PT services to address deficits in strength, balance, and mobility in  order to return to full function at home.           PT Short Term Goals - 02/01/20 1215      PT SHORT TERM GOAL #1   Title Pt will be independent with HEP in order to improve strength and balance in order to decrease fall risk and improve function at home.    Baseline 6/16: HEP compliant most days; 8/4: HEP compliant most days    Time 3    Period Weeks    Status Partially Met    Target Date 02/22/20             PT Long Term Goals - 02/01/20 1214      PT LONG TERM GOAL #1   Title Pt will improve BERG by at least 3 points in order to demonstrate clinically significant improvement in balance.    Baseline 11/10/19: 51/56 6/16: 53/56; 8/4: 48/56    Time 6    Period Weeks    Status On-going    Target Date 03/14/20      PT LONG TERM GOAL #2   Title Pt will improve ABC by at least 13% in order to demonstrate clinically significant improvement in balance confidence.    Baseline 11/10/19: 43.125% 6/16: 49%; 02/01/20: 40.625%    Time 6    Period Weeks    Status On-going    Target Date 03/14/20      PT LONG TERM GOAL #3   Title Pt will improve FOTO to at least 68 in order to demonstrate significant improvement in function at home.    Baseline 11/10/19: 58 6/16: 50; 8/4: 50    Time 6    Period Weeks    Status On-going    Target Date 03/14/20                 Plan - 02/01/20 1213    Clinical Impression Statement Pt demonstrates excellent motivation during session today. Updated goals today. Patient performed scored a 48/53 on Berg, 40.625% on ABC, and 50 on FOTO. Berg, ABC, and FOTO are worse than previous progress note. Over the past month, he had changes in medication and had a week where he was falling frequently, which might contribute to his lower scores. Session focused on balance and some strength exercises. Patient cued to pick the left foot up and take big steps throughout session. Next session will focus on strengthening the LE and continue balance exercises. Pt  encouraged to continue HEP and follow-up as scheduled. Pt will benefit from PT services to address deficits in strength, balance, and mobility in order to return to full function at home.    Personal Factors and Comorbidities Age;Comorbidity 2    Comorbidities CVA, vascular dementia    Examination-Activity  Limitations Locomotion Level;Transfers    Examination-Participation Restrictions Community Activity;Shop    Stability/Clinical Decision Making Evolving/Moderate complexity    Clinical Decision Making Moderate    Rehab Potential Fair    PT Frequency 2x / week    PT Duration 6 weeks    PT Treatment/Interventions ADLs/Self Care Home Management;Aquatic Therapy;Biofeedback;Cryotherapy;Canalith Repostioning;Electrical Stimulation;Iontophoresis 64m/ml Dexamethasone;Moist Heat;Traction;Ultrasound;DME Instruction;Gait training;Stair training;Functional mobility training;Therapeutic activities;Therapeutic exercise;Balance training;Neuromuscular re-education;Cognitive remediation;Patient/family education;Manual techniques;Passive range of motion;Dry needling;Vestibular    PT Next Visit Plan Review HEP, continue with strength and balance    PT Home Exercise Plan Medbridge Access Code: [email or web address](clams, marches, sit to stand green tband, tandem balance, gastroc stretch);    Consulted and Agree with Plan of Care Patient;Family member/caregiver    Family Member Consulted Wife           Patient will benefit from skilled therapeutic intervention in order to improve the following deficits and impairments:  Abnormal gait, Decreased balance, Decreased safety awareness, Decreased knowledge of precautions, Decreased cognition, Decreased mobility  Visit Diagnosis: Unsteadiness on feet  History of falling     Problem List Patient Active Problem List   Diagnosis Date Noted  . Stroke ([place] 01/27/2017  . High cholesterol 01/27/2017  . Hypertension 01/27/2017  . Carotid stenosis 01/27/2017  . Occlusion and  stenosis of carotid artery with cerebral infarction 07/27/2013    This entire session was performed under direct supervision and direction of a licensed therapist/therapist assistant . I have personally read, edited and approve of the note as written.   KNoemi Chapel SPT JPhillips GroutPT, DPT, GCS  Troy Pugh,Troy Pugh 02/02/2020, 10:19 AM  [place] [address] [address]Phone: [phone]  Fax:  [phone] Name: HLelynd Poer MRN: [id number] of Birth: [birth year]

## 2020-02-01 NOTE — Therapy (Signed)
[place] MAIN [facility] SERVICES [address] [place], [place], [address] Phone: [phone]   Fax:  [phone]  Speech Language Pathology Treatment  Patient Details  Name: Troy Pugh. MRN: [id number] Date of Birth: December 15, 1940 Referring Provider (SLP): Dr. Manuella Ghazi   Encounter Date: 02/01/2020   End of Session - 02/01/20 1311    Visit Number 7    Number of Visits 17    Date for SLP Re-Evaluation 03/09/20    Authorization Type Medicare    Authorization Time Period Start 01/09/2020    Authorization - Visit Number 7    Progress Note Due on Visit 10    SLP Start Time 1000    SLP Stop Time  1050    SLP Time Calculation (min) 50 min    Activity Tolerance Patient tolerated treatment well           Past Medical History:  Diagnosis Date  . 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 High cholesterol   . Hypertension   . Stroke [facility])     Past Surgical History:  Procedure Laterality Date  . CATARACT EXTRACTION    . GALLBLADDER SURGERY      There were no vitals filed for this visit.   Subjective Assessment - 02/01/20 1311    Subjective Hard-working, motivated    Patient is accompained by: Family member                 ADULT SLP TREATMENT - 02/01/20 0001      General Information   Behavior/Cognition Alert;Cooperative;Pleasant mood      Cognitive-Linquistic Treatment   Treatment focused on Voice    Skilled Treatment Loud "Ah": Sustain loud, good vocal quality vowel for up to 20 seconds.  Maintain intelligibility at 80%+ for conversation, reading, and structured expression tasks (40+ minutes) given min cues.      Assessment / Recommendations / Plan   Plan Continue with current plan of care      Progression Toward Goals   Progression toward goals Progressing toward goals            SLP Education - 02/01/20 1311    Education provided Yes    Education Details Loud and slow speech    Person(s) Educated Patient;Spouse     Methods Explanation    Comprehension Verbalized understanding              SLP Long Term Goals - 01/10/20 1510      SLP LONG TERM GOAL #1   Title The patient will maximize voice quality, oral resonance, and loudness using breath support for sustained vowel production, pitch glides, and hierarchal speech drill.    Time 8    Period Weeks    Status New    Target Date 03/09/20      SLP LONG TERM GOAL #2   Title The patient will maximize voice quality, oral resonance, and loudness using breath support for paragraph length recitation with 80% accuracy.    Time 8    Period Weeks    Status New    Target Date 03/09/20      SLP LONG TERM GOAL #3   Title Patient and his wife will participate in developing functional compensatory strategies to improve engagement, activity, and social interactions.    Time 8    Period Weeks    Status New    Target Date 03/09/20            Plan -  02/01/20 1312    Clinical Impression Statement Patient is working hard to increase volume and overall intelligibility of speech. He recognizes when he is more difficult to understand and knows which strategies to use to improve speech. Will continue to work on more consistent use of his loud, clear voice.    Speech Therapy Frequency 2x / week    Duration Other (comment)    Treatment/Interventions Patient/family education;SLP instruction and feedback;Compensatory techniques    Potential to Achieve Goals Good    Potential Considerations Ability to learn/carryover information;Cooperation/participation level;Previous level of function;Severity of impairments;Family/community support;Other (comment)    Consulted and Agree with Plan of Care Patient;Family member/caregiver    Family Member Consulted Spouse           Patient will benefit from skilled therapeutic intervention in order to improve the following deficits and impairments:   Dysphonia  Cognitive communication deficit    Problem List Patient  Active Problem List   Diagnosis Date Noted  . Stroke ([place]) 01/27/2017  . High cholesterol 01/27/2017  . Hypertension 01/27/2017  . Carotid stenosis 01/27/2017  . Occlusion and stenosis of carotid artery with cerebral infarction 07/27/2013   [name], MS/CCC- SLP  [name] 02/01/2020, 1:13 PM  [place] MAIN [facility] SERVICES [address] [place], [place], [address] Phone: [phone]   Fax:  [phone]   Name: Troy Pugh. MRN: [id number] Date of Birth: [birth year]

## 2020-02-02 ENCOUNTER — Encounter: Payer: Self-pay | Admitting: Physical Therapy

## 2020-02-02 ENCOUNTER — Ambulatory Visit: Payer: Medicare Other | Admitting: Physical Therapy

## 2020-02-02 ENCOUNTER — Other Ambulatory Visit: Payer: Self-pay

## 2020-02-02 DIAGNOSIS — R2681 Unsteadiness on feet: Secondary | ICD-10-CM

## 2020-02-02 DIAGNOSIS — R41841 Cognitive communication deficit: Secondary | ICD-10-CM

## 2020-02-02 DIAGNOSIS — R490 Dysphonia: Secondary | ICD-10-CM

## 2020-02-02 DIAGNOSIS — Z9181 History of falling: Secondary | ICD-10-CM

## 2020-02-02 NOTE — Therapy (Signed)
[place] MAIN [facility] SERVICES [address] [place], [place], [address] Phone: [phone]   Fax:  [phone]  Physical Therapy Treatment  Patient Details  Name: Troy Pugh. MRN: [id number] Date of Birth: [birth year] Referring Provider (PT): Dr. Doy Hutching   Encounter Date: 02/02/2020   PT End of Session - 02/02/20 1017    Visit Number 21    Number of Visits 37    Date for PT Re-Evaluation 03/14/20    Authorization Type eval 6/16, progress note / re-certifcation: 0/9/23    Authorization Time Period FOTO- PT    PT Start Time 1015    PT Stop Time 1100    PT Time Calculation (min) 45 min    Equipment Utilized During Treatment Gait belt    Activity Tolerance Patient tolerated treatment well    Behavior During Therapy WFL for tasks assessed/performed           Past Medical History:  Diagnosis Date  . 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 High cholesterol   . Hypertension   . Stroke [facility])     Past Surgical History:  Procedure Laterality Date  . CATARACT EXTRACTION    . GALLBLADDER SURGERY      There were no vitals filed for this visit.   Subjective Assessment - 02/02/20 1016    Subjective Pt is doing well today. No pain reported upon arrival. No falls since last therapy session. No specific questions or concern.    Pertinent History Pt suffered a CVA 10 years ago. Due to recent worsening balance and falls his MD ordered PT. He started [facility] PT in January and had his last therapy session this Monday. Pt reports that he was working on balance and strengthening with therapy. He has been experiencing worsening L drop foot over the last year or so and now has an AFO which he picked up 2-3 weeks ago. Pt reports improvement in his gait since getting his AFO. He has had at least 6 falls in the last 12 months. He initially had PT at [facility] after his CVA but was doing relatively well until recently. He has a history of vascular dementia and started  Aricept last night. History is felt to be unreliable from patient and most information is provided by his wife.    Patient Stated Goals Improve balance, gait, and decrease risk for falls    Currently in Pain? No/denies    Multiple Pain Sites No           Treatment:  BOSU: Lunge to BOSU ball x 10 , cues for going slow and to control the speed, BLE and difficulty with lunge part of the exercise with LLE  TM walking : . 5 miles/ hour and 2 mins with difficulty not dragging LLE foot and max cues for picking up the L leg to clear the TM  Leg press 50 lbs x 20 x 3 , BLE  Prone position Stretching B hip flex x 1 min  Prone knee flex x 30 sec x 3 reps BLE  RLE extension w/ knee flex   sidelying R  hip abd with manual resistance x 15 x 2   supine L hip abd x 15 x 2 , cues not to raise up his LLE from the table   Bridging x 15 with 10 sec hold    Pt educated throughout session about proper posture and technique with exercises. Improved exercise technique, movement at target joints, use of  target muscles after min to mod verbal, visual, tactile cues. CGA and Min to mod verbal cues used throughout with increased in postural sway and LOB most seen with narrow base of support and while on uneven surfaces. Continues to have balance deficits typical with diagnosis.                          PT Education - 02/02/20 1016    Education provided Yes    Education Details safety with mobility    Person(s) Educated Patient    Methods Explanation    Comprehension Need further instruction;Tactile cues required            PT Short Term Goals - 02/01/20 1215      PT SHORT TERM GOAL #1   Title Pt will be independent with HEP in order to improve strength and balance in order to decrease fall risk and improve function at home.    Baseline 6/16: HEP compliant most days; 8/4: HEP compliant most days    Time 3    Period Weeks    Status Partially Met    Target Date 02/22/20              PT Long Term Goals - 02/01/20 1214      PT LONG TERM GOAL #1   Title Pt will improve BERG by at least 3 points in order to demonstrate clinically significant improvement in balance.    Baseline 11/10/19: 51/56 6/16: 53/56; 8/4: 48/56    Time 6    Period Weeks    Status On-going    Target Date 03/14/20      PT LONG TERM GOAL #2   Title Pt will improve ABC by at least 13% in order to demonstrate clinically significant improvement in balance confidence.    Baseline 11/10/19: 43.125% 6/16: 49%; 02/01/20: 40.625%    Time 6    Period Weeks    Status On-going    Target Date 03/14/20      PT LONG TERM GOAL #3   Title Pt will improve FOTO to at least 68 in order to demonstrate significant improvement in function at home.    Baseline 11/10/19: 58 6/16: 50; 8/4: 50    Time 6    Period Weeks    Status On-going    Target Date 03/14/20                 Plan - 02/02/20 1017    Clinical Impression Statement Patient instructed in advanced LE strengthening and intermediate balance exercise. Patient required min VCS to improve weight shift and to increase terminal knee extension for better stance control. Patient would benefit from additional skilled PT intervention to improve strength, balance and gait safety.   Personal Factors and Comorbidities Age;Comorbidity 2    Comorbidities CVA, vascular dementia    Examination-Activity Limitations Locomotion Level;Transfers    Examination-Participation Restrictions Community Activity;Shop    Stability/Clinical Decision Making Evolving/Moderate complexity    Rehab Potential Fair    PT Frequency 2x / week    PT Duration 6 weeks    PT Treatment/Interventions ADLs/Self Care Home Management;Aquatic Therapy;Biofeedback;Cryotherapy;Canalith Repostioning;Electrical Stimulation;Iontophoresis 56m/ml Dexamethasone;Moist Heat;Traction;Ultrasound;DME Instruction;Gait training;Stair training;Functional mobility training;Therapeutic  activities;Therapeutic exercise;Balance training;Neuromuscular re-education;Cognitive remediation;Patient/family education;Manual techniques;Passive range of motion;Dry needling;Vestibular    PT Next Visit Plan Review HEP, continue with strength and balance    PT Home Exercise Plan Medbridge Access Code: [email or web address](clams, marches, sit to stand green tband, tandem balance,  gastroc stretch);    Consulted and Agree with Plan of Care Patient;Family member/caregiver    Family Member Consulted Wife           Patient will benefit from skilled therapeutic intervention in order to improve the following deficits and impairments:  Abnormal gait, Decreased balance, Decreased safety awareness, Decreased knowledge of precautions, Decreased cognition, Decreased mobility  Visit Diagnosis: Unsteadiness on feet  History of falling  Dysphonia  Cognitive communication deficit     Problem List Patient Active Problem List   Diagnosis Date Noted  . Stroke ([place]) 01/27/2017  . High cholesterol 01/27/2017  . Hypertension 01/27/2017  . Carotid stenosis 01/27/2017  . Occlusion and stenosis of carotid artery with cerebral infarction 07/27/2013    Alanson Puls, PT DPT 02/02/2020, 10:18 AM  [place] MAIN [facility] SERVICES [address] [place], [place], [address] Phone: [phone]   Fax:  [phone]  Name: Troy Pugh. MRN: [id number] Date of Birth: [birth year]

## 2020-02-06 ENCOUNTER — Ambulatory Visit: Payer: Medicare Other

## 2020-02-06 ENCOUNTER — Ambulatory Visit: Payer: Medicare Other | Admitting: Speech Pathology

## 2020-02-08 ENCOUNTER — Ambulatory Visit: Payer: Federal, State, Local not specified - PPO

## 2020-02-08 ENCOUNTER — Encounter: Payer: Federal, State, Local not specified - PPO | Admitting: Speech Pathology

## 2020-02-13 ENCOUNTER — Encounter: Payer: Federal, State, Local not specified - PPO | Admitting: Speech Pathology

## 2020-02-13 ENCOUNTER — Ambulatory Visit: Payer: Federal, State, Local not specified - PPO

## 2020-02-15 ENCOUNTER — Ambulatory Visit: Payer: Federal, State, Local not specified - PPO

## 2020-02-15 ENCOUNTER — Encounter: Payer: Federal, State, Local not specified - PPO | Admitting: Speech Pathology

## 2020-02-20 ENCOUNTER — Encounter: Payer: Federal, State, Local not specified - PPO | Admitting: Speech Pathology

## 2020-02-20 ENCOUNTER — Ambulatory Visit: Payer: Federal, State, Local not specified - PPO

## 2020-02-22 ENCOUNTER — Encounter: Payer: Federal, State, Local not specified - PPO | Admitting: Speech Pathology

## 2020-02-22 ENCOUNTER — Ambulatory Visit: Payer: Federal, State, Local not specified - PPO

## 2020-02-27 ENCOUNTER — Ambulatory Visit: Payer: Medicare Other

## 2020-02-27 ENCOUNTER — Ambulatory Visit: Payer: Medicare Other | Admitting: Speech Pathology

## 2020-02-29 ENCOUNTER — Ambulatory Visit: Payer: Medicare Other

## 2020-02-29 ENCOUNTER — Ambulatory Visit: Payer: Medicare Other | Admitting: Speech Pathology

## 2020-03-07 ENCOUNTER — Ambulatory Visit: Payer: Federal, State, Local not specified - PPO

## 2020-03-07 ENCOUNTER — Encounter: Payer: Federal, State, Local not specified - PPO | Admitting: Speech Pathology

## 2020-03-12 ENCOUNTER — Ambulatory Visit: Payer: Federal, State, Local not specified - PPO

## 2020-03-12 ENCOUNTER — Encounter: Payer: Federal, State, Local not specified - PPO | Admitting: Speech Pathology

## 2020-03-14 ENCOUNTER — Encounter: Payer: Federal, State, Local not specified - PPO | Admitting: Speech Pathology

## 2020-03-14 ENCOUNTER — Ambulatory Visit: Payer: Federal, State, Local not specified - PPO

## 2020-03-15 ENCOUNTER — Other Ambulatory Visit (INDEPENDENT_AMBULATORY_CARE_PROVIDER_SITE_OTHER): Payer: Self-pay | Admitting: Nurse Practitioner

## 2020-03-15 DIAGNOSIS — I6523 Occlusion and stenosis of bilateral carotid arteries: Secondary | ICD-10-CM

## 2020-03-19 ENCOUNTER — Encounter: Payer: Federal, State, Local not specified - PPO | Admitting: Speech Pathology

## 2020-03-19 ENCOUNTER — Ambulatory Visit: Payer: Federal, State, Local not specified - PPO

## 2020-03-20 ENCOUNTER — Encounter (INDEPENDENT_AMBULATORY_CARE_PROVIDER_SITE_OTHER): Payer: Medicare Other

## 2020-03-20 ENCOUNTER — Ambulatory Visit (INDEPENDENT_AMBULATORY_CARE_PROVIDER_SITE_OTHER): Payer: Medicare Other | Admitting: Vascular Surgery

## 2020-03-21 ENCOUNTER — Encounter (INDEPENDENT_AMBULATORY_CARE_PROVIDER_SITE_OTHER): Payer: Self-pay | Admitting: Nurse Practitioner

## 2020-03-21 ENCOUNTER — Ambulatory Visit (INDEPENDENT_AMBULATORY_CARE_PROVIDER_SITE_OTHER): Payer: Medicare Other

## 2020-03-21 ENCOUNTER — Ambulatory Visit: Payer: Federal, State, Local not specified - PPO

## 2020-03-21 ENCOUNTER — Encounter: Payer: Federal, State, Local not specified - PPO | Admitting: Speech Pathology

## 2020-03-21 ENCOUNTER — Ambulatory Visit (INDEPENDENT_AMBULATORY_CARE_PROVIDER_SITE_OTHER): Payer: Medicare Other | Admitting: Nurse Practitioner

## 2020-03-21 ENCOUNTER — Other Ambulatory Visit: Payer: Self-pay

## 2020-03-21 VITALS — BP 155/73 | HR 75 | Ht 69.0 in | Wt 150.0 lb

## 2020-03-21 DIAGNOSIS — I6523 Occlusion and stenosis of bilateral carotid arteries: Secondary | ICD-10-CM | POA: Diagnosis not present

## 2020-03-21 DIAGNOSIS — I10 Essential (primary) hypertension: Secondary | ICD-10-CM | POA: Diagnosis not present

## 2020-03-21 DIAGNOSIS — E7800 Pure hypercholesterolemia, unspecified: Secondary | ICD-10-CM

## 2020-03-26 ENCOUNTER — Ambulatory Visit: Payer: Federal, State, Local not specified - PPO

## 2020-03-26 ENCOUNTER — Encounter: Payer: Federal, State, Local not specified - PPO | Admitting: Speech Pathology

## 2020-03-26 ENCOUNTER — Encounter (INDEPENDENT_AMBULATORY_CARE_PROVIDER_SITE_OTHER): Payer: Self-pay | Admitting: Nurse Practitioner

## 2020-03-26 NOTE — Progress Notes (Signed)
Subjective:    Patient ID: Troy Fridge., male    DOB: 1941/03/29, 79 y.o.   MRN: [id number] Chief Complaint  Patient presents with  . Follow-up    U/S follow up    The patient is seen for follow up evaluation of carotid stenosis. The carotid stenosis followed by ultrasound.   The patient denies amaurosis fugax. There is no recent history of TIA symptoms or focal motor deficits.   The patient is taking enteric-coated aspirin 81 mg daily.  There is no history of migraine headaches. There is no history of seizures.  The patient has a history of coronary artery disease, no recent episodes of angina or shortness of breath. The patient denies PAD or claudication symptoms. There is a history of hyperlipidemia which is being treated with a statin.    Carotid Duplex done today shows 40-59% bilaterally.  There is a slight increase in the left ICA velocities from previous study on 03/21/2019.  Right ICA stent is patent.   Review of Systems  Eyes: Negative for visual disturbance.  Neurological: Negative for weakness.  All other systems reviewed and are negative.      Objective:   Physical Exam Vitals reviewed.  HENT:     Head: Normocephalic.  Cardiovascular:     Rate and Rhythm: Normal rate and regular rhythm.     Pulses: Normal pulses.  Pulmonary:     Effort: Pulmonary effort is normal.  Skin:    General: Skin is warm and dry.  Neurological:     Mental Status: He is alert and oriented to person, place, and time.  Psychiatric:        Mood and Affect: Mood normal.        Behavior: Behavior normal.        Thought Content: Thought content normal.        Judgment: Judgment normal.     BP (!) 155/73   Pulse 75   Ht 5\' 9"  (1.753 m)   Wt 150 lb (68 kg)   BMI 22.15 kg/m   Past Medical History:  Diagnosis Date  . 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 High cholesterol   . Hypertension   . Stroke ([place])   . Vascular dementia [facility])     Social History   Socioeconomic  History  . Marital status: Married    Spouse name: chris  . Number of children: 0  . Years of education: college  . Highest education level: Not on file  Occupational History  . Not on file  Tobacco Use  . Smoking status: Never Smoker  . Smokeless tobacco: Never Used  Substance and Sexual Activity  . Alcohol use: Yes    Alcohol/week: 3.0 standard drinks    Types: 1 Glasses of wine, 1 Cans of beer, 1 Shots of liquor per week    Comment: occasionally  . Drug use: No  . Sexual activity: Not on file  Other Topics Concern  . Not on file  Social History Narrative   Patient is married, no children.   Patient is-- handed.   Patient has college education.   Patient drinks -cups daily.   Social Determinants of Health   Financial Resource Strain:   . Difficulty of Paying Living Expenses: Not on file  Food Insecurity:   . Worried About Charity fundraiser in the Last Year: Not on file  . Ran Out of Food in the Last Year: Not on file  Transportation Needs:   .  Lack of Transportation (Medical): Not on file  . Lack of Transportation (Non-Medical): Not on file  Physical Activity:   . Days of Exercise per Week: Not on file  . Minutes of Exercise per Session: Not on file  Stress:   . Feeling of Stress : Not on file  Social Connections:   . Frequency of Communication with Friends and Family: Not on file  . Frequency of Social Gatherings with Friends and Family: Not on file  . Attends Religious Services: Not on file  . Active Member of Clubs or Organizations: Not on file  . Attends Archivist Meetings: Not on file  . Marital Status: Not on file  Intimate Partner Violence:   . Fear of Current or Ex-Partner: Not on file  . Emotionally Abused: Not on file  . Physically Abused: Not on file  . Sexually Abused: Not on file    Past Surgical History:  Procedure Laterality Date  . CATARACT EXTRACTION    . GALLBLADDER SURGERY      Family History  Problem Relation Age of  Onset  . Kidney failure Mother   . Stroke Father     Allergies  Allergen Reactions  . Other Hives  . Sulfa Antibiotics Hives  . Sulphadimidine [Sulfamethazine]        Assessment & Plan:   1. Bilateral carotid artery stenosis Recommend:  Given the patient's asymptomatic subcritical stenosis no further invasive testing or surgery at this time.  Duplex ultrasound shows 40-59% stenosis bilaterally.  Continue antiplatelet therapy as prescribed Continue management of CAD, HTN and Hyperlipidemia Healthy heart diet,  encouraged exercise at least 4 times per week Follow up in 12 months with duplex ultrasound and physical exam   2. Benign essential hypertension Continue antihypertensive medications as already ordered, these medications have been reviewed and there are no changes at this time.   3. High cholesterol Continue statin as ordered and reviewed, no changes at this time    Current Outpatient Medications on File Prior to Visit  Medication Sig Dispense Refill  . allopurinol (ZYLOPRIM) 100 MG tablet Take 100 mg by mouth daily.     Marland Kitchen atorvastatin (LIPITOR) 10 MG tablet     . buPROPion (WELLBUTRIN XL) 150 MG 24 hr tablet Take 150 mg by mouth daily.    . Calcium Carb-Cholecalciferol ([employer] CALCIUM + D3) 600-500 MG-UNIT CAPS Take 1 tablet by mouth daily.     . Cholecalciferol (VITAMIN D) 2000 UNITS tablet Take 2,000 Units by mouth daily.    . clopidogrel (PLAVIX) 75 MG tablet Take 75 mg by mouth daily.     . colchicine 0.6 MG tablet Take 0.6 mg by mouth as needed.    . donepezil (ARICEPT) 5 MG tablet Take by mouth.    . fexofenadine (ALLEGRA) 180 MG tablet Take 180 mg by mouth as needed.     . fluticasone (FLONASE) 50 MCG/ACT nasal spray as needed.     . folic acid (FOLVITE) 001 MCG tablet Take 400 mcg by mouth daily.    Marland Kitchen ketoconazole (NIZORAL) 2 % shampoo Apply 1 application topically once a week.    . nystatin (MYCOSTATIN/NYSTOP) powder Apply topically.    . pantoprazole  (PROTONIX) 20 MG tablet     . tamsulosin (FLOMAX) 0.4 MG CAPS capsule Take 0.4 mg by mouth daily.    . fludrocortisone (FLORINEF) 0.1 MG tablet     . pindolol (VISKEN) 5 MG tablet Take 5 mg by mouth daily. (Patient not taking: Reported  on 03/21/2020)    . venlafaxine XR (EFFEXOR-XR) 75 MG 24 hr capsule  (Patient not taking: Reported on 03/21/2020)     No current facility-administered medications on file prior to visit.    There are no Patient Instructions on file for this visit. No follow-ups on file.   Kris Hartmann, NP

## 2020-03-28 ENCOUNTER — Encounter: Payer: Federal, State, Local not specified - PPO | Admitting: Speech Pathology

## 2020-03-28 ENCOUNTER — Ambulatory Visit: Payer: Federal, State, Local not specified - PPO

## 2020-12-21 IMAGING — CT CT HEAD W/O CM
3 series · 15 of 47 positions shown, 18 images · non-contrast
Comparison: Brain MRI 05/15/2019, prior head CT examination
12/18/2016 and earlier

CLINICAL DATA: Weakness of left lower extremity. Vascular dementia
with behavior disturbance. Additional provided: Weakness left side
extremities, history of stroke and dementia.

EXAM:
CT HEAD WITHOUT CONTRAST
TECHNIQUE: Contiguous axial images were obtained from the base of the skull
through the vertex without intravenous contrast.

[Series 2: head wo · axial · 0.45mm/px · z∈[-173,-48]mm · 9 of 31 slices shown, 12 images]
[im 3/31  brain]
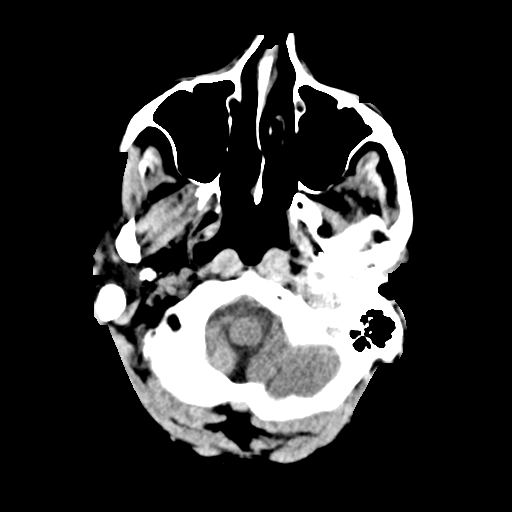
[im 3/31  bone]
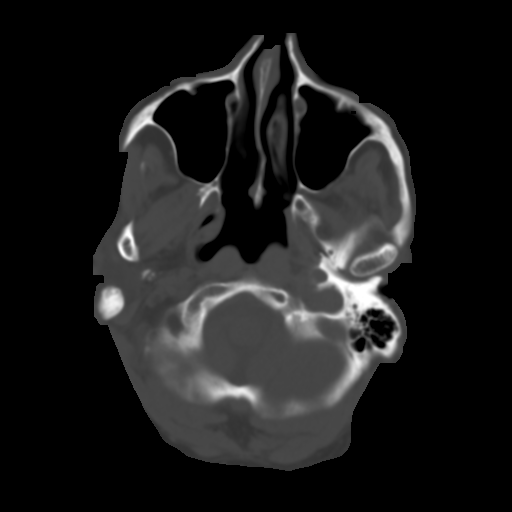
[im 6/31  brain]
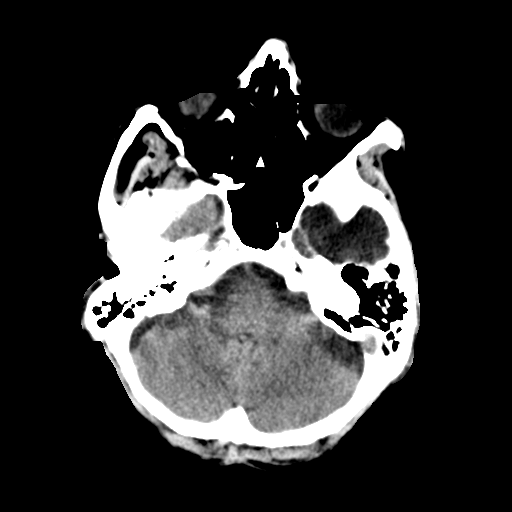
[im 9/31  brain]
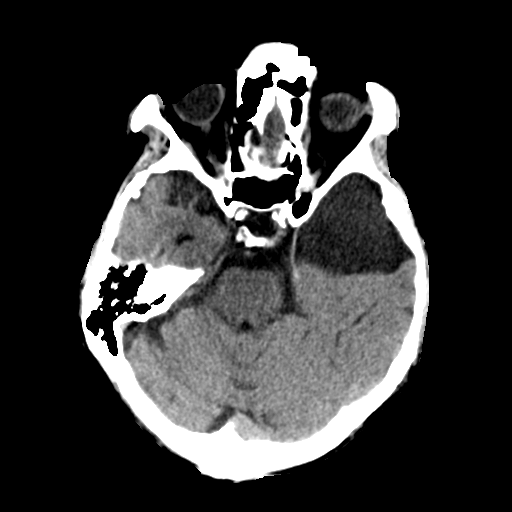
[im 12/31  brain]
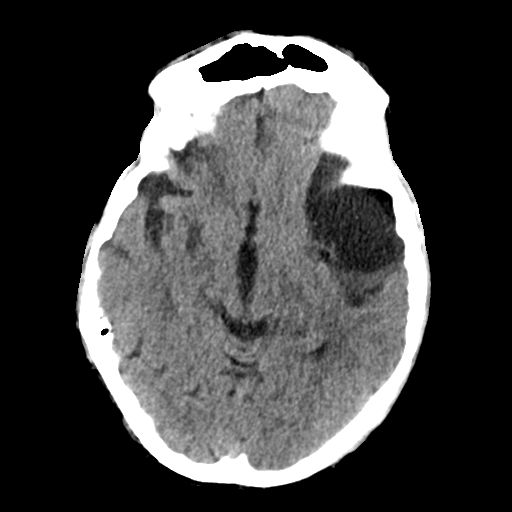
[im 16/31  brain]
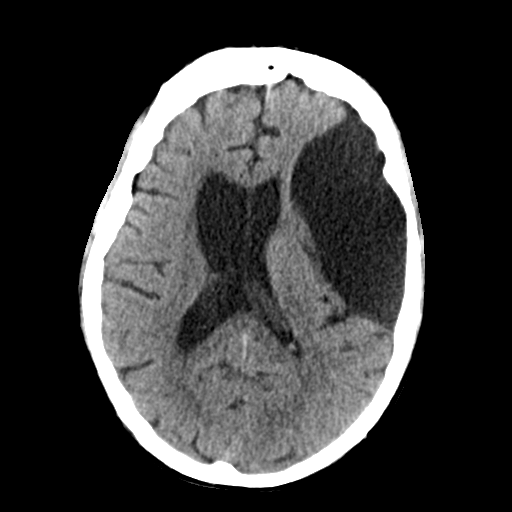
[im 16/31  bone]
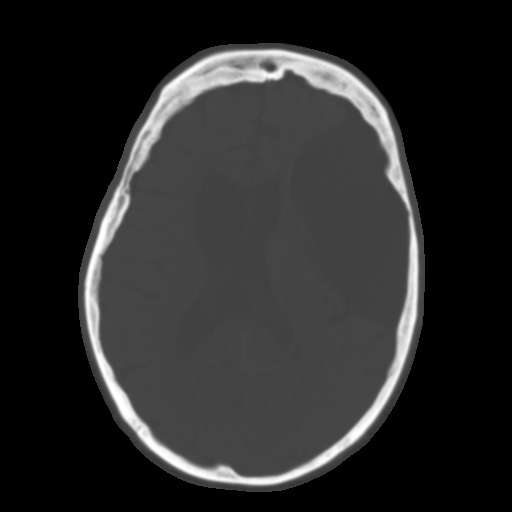
[im 19/31  brain]
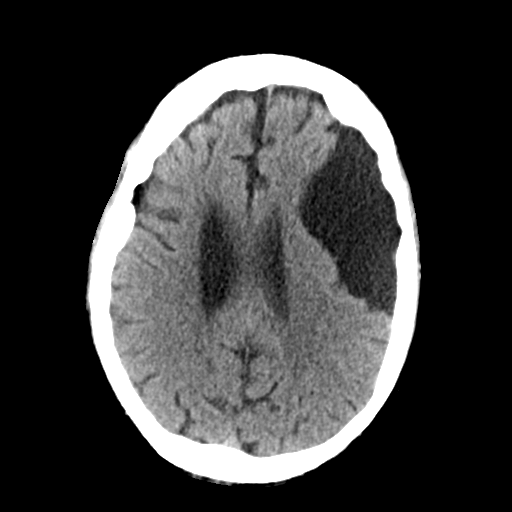
[im 22/31  brain]
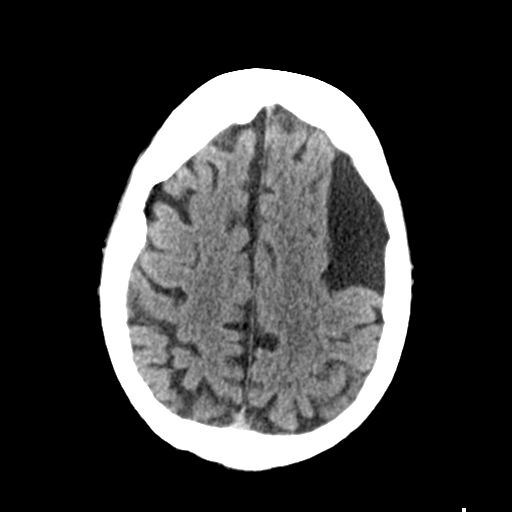
[im 25/31  brain]
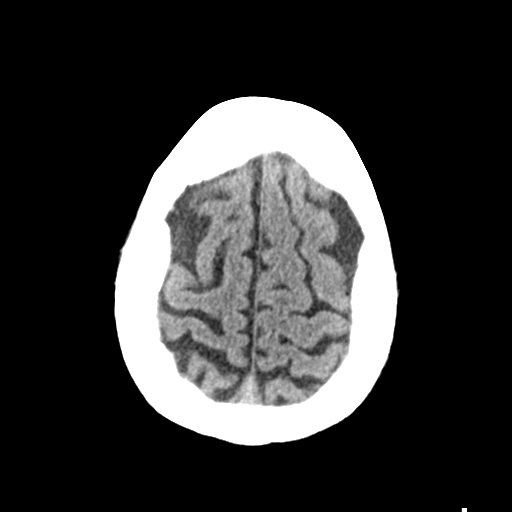
[im 28/31  brain]
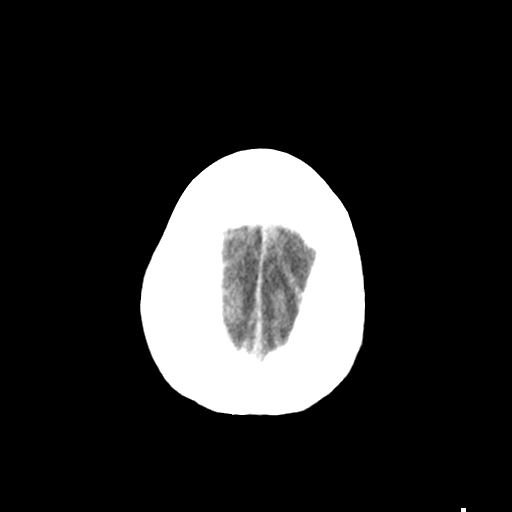
[im 28/31  bone]
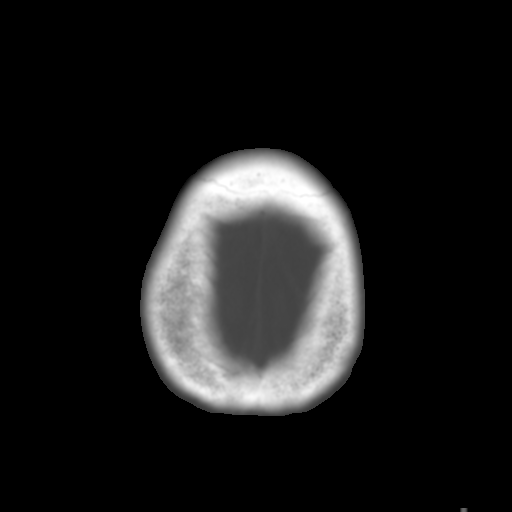

[Series 4: coronal soft tissue · coronal · 0.33mm/px · 3 of 70 slices shown]
[im 24/70  brain]
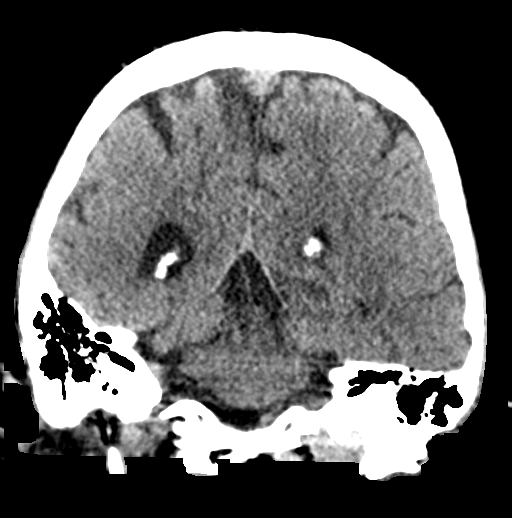
[im 31/70  brain]
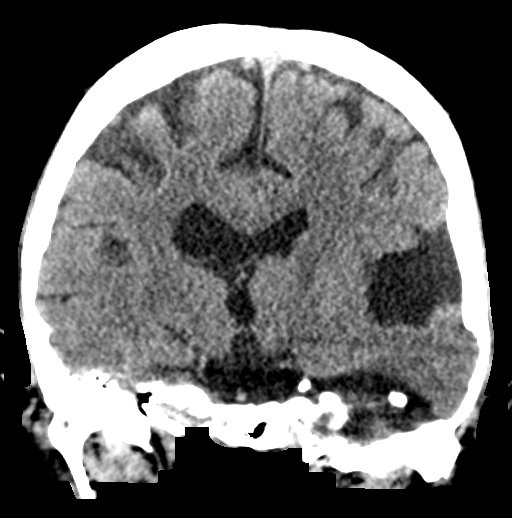
[im 39/70  brain]
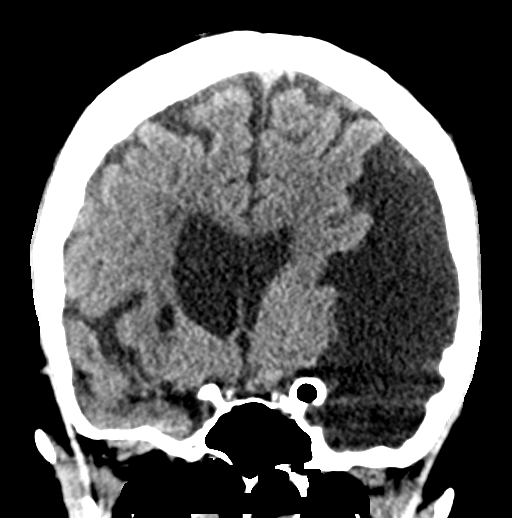

[Series 5: sagittal soft tissue · sagittal · 0.31mm/px · 3 of 61 slices shown]
[im 21/61  brain]
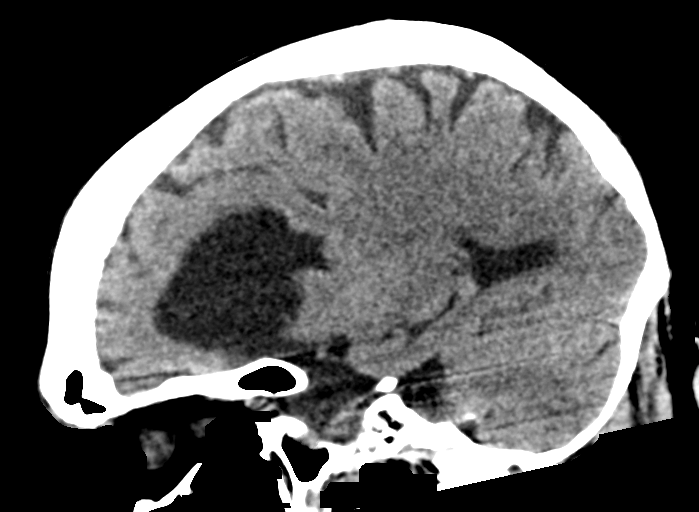
[im 31/61  brain]
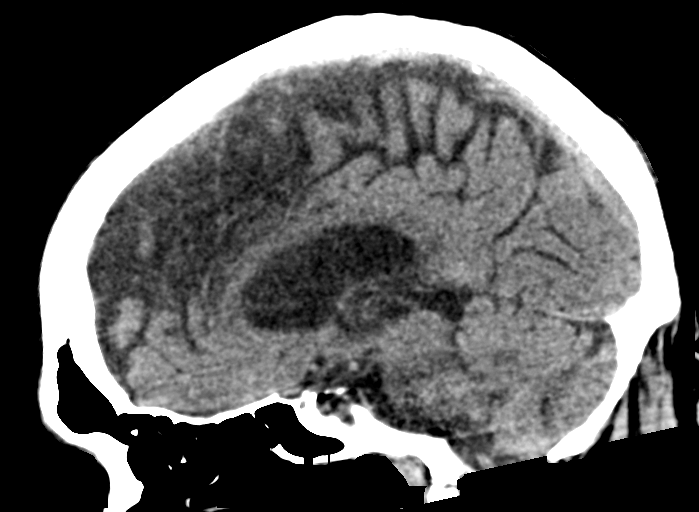
[im 41/61  brain]
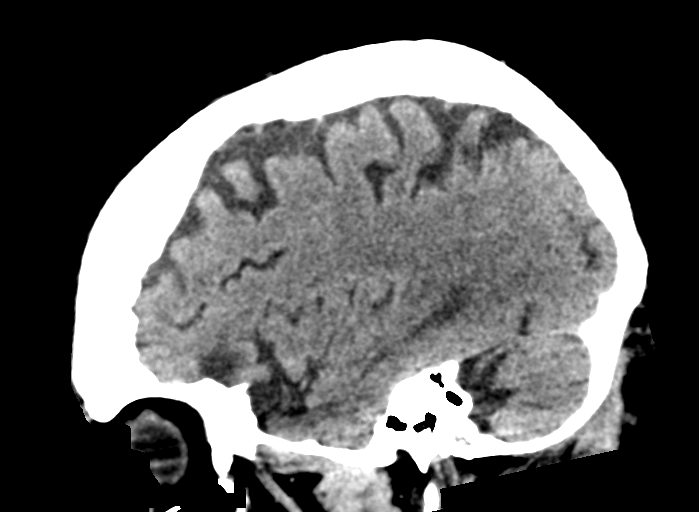

[15 of 47 positions shown; findings below may reference images not displayed]

FINDINGS: Brain:

Unchanged size of a large arachnoid cyst measuring 10 cm overlying
the left cerebral hemisphere. Stable mass effect with partial
effacement of the left lateral ventricle and 4 mm rightward midline
shift.

Redemonstrated chronic encephalomalacia and gliosis within the
anteroinferior right frontal lobe.

Redemonstrated chronic small-vessel infarct within the right basal
ganglia.

Stable background mild generalized parenchymal atrophy and chronic
small vessel ischemic disease.

There is no evidence of acute intracranial hemorrhage. No acute
demarcated cortical infarct is identified.

No extra-axial fluid collection.

No evidence of intracranial mass.

No midline shift.

Vascular: No hyperdense vessel.  Atherosclerotic calcifications.

Skull: Normal. Negative for fracture or focal lesion.

Sinuses/Orbits: Visualized orbits show no acute finding. Minimal
ethmoid sinus mucosal thickening. No significant mastoid effusion at
the imaged levels.
IMPRESSION: No CT evidence of acute intracranial abnormality.

Unchanged large arachnoid cyst overlying the left cerebral
hemisphere. Stable mass effect with partial effacement of the left
lateral ventricle and 4 mm rightward midline shift.

Redemonstrated focus of chronic encephalomalacia within the
anteroinferior right frontal lobe.

Redemonstrate small vessel infarct within the right basal ganglia.

Stable background mild generalized parenchymal atrophy and chronic
small vessel ischemic disease.

Mild ethmoid sinus mucosal thickening.

## 2020-12-22 ENCOUNTER — Other Ambulatory Visit: Payer: Self-pay

## 2020-12-22 ENCOUNTER — Encounter: Payer: Self-pay | Admitting: Emergency Medicine

## 2020-12-22 ENCOUNTER — Emergency Department
Admission: EM | Admit: 2020-12-22 | Discharge: 2020-12-22 | Disposition: A | Discharge status: Home / Self Care | Payer: Medicare Other | Attending: Emergency Medicine | Admitting: Emergency Medicine

## 2020-12-22 DIAGNOSIS — N3000 Acute cystitis without hematuria: Secondary | ICD-10-CM

## 2020-12-22 DIAGNOSIS — R55 Syncope and collapse: Secondary | ICD-10-CM | POA: Diagnosis not present

## 2020-12-22 DIAGNOSIS — Z79899 Other long term (current) drug therapy: Secondary | ICD-10-CM | POA: Insufficient documentation

## 2020-12-22 DIAGNOSIS — Z7902 Long term (current) use of antithrombotics/antiplatelets: Secondary | ICD-10-CM | POA: Insufficient documentation

## 2020-12-22 DIAGNOSIS — F0151 Vascular dementia with behavioral disturbance: Secondary | ICD-10-CM | POA: Diagnosis not present

## 2020-12-22 DIAGNOSIS — I129 Hypertensive chronic kidney disease with stage 1 through stage 4 chronic kidney disease, or unspecified chronic kidney disease: Secondary | ICD-10-CM | POA: Diagnosis not present

## 2020-12-22 DIAGNOSIS — Z8546 Personal history of malignant neoplasm of prostate: Secondary | ICD-10-CM | POA: Diagnosis not present

## 2020-12-22 DIAGNOSIS — N183 Chronic kidney disease, stage 3 unspecified: Secondary | ICD-10-CM | POA: Insufficient documentation

## 2020-12-22 LAB — TROPONIN I (HIGH SENSITIVITY): Troponin I (High Sensitivity): 4 ng/L (ref <18)

## 2020-12-22 LAB — CBC WITH DIFFERENTIAL/PLATELET
Abs Immature Granulocytes: 0.01 10*3/uL (ref 0.00–0.07)
Basophils Absolute: 0 10*3/uL (ref 0.0–0.1)
Basophils Relative: 1 %
Eosinophils Absolute: 0.2 10*3/uL (ref 0.0–0.5)
Eosinophils Relative: 3 %
HCT: 35.2 % — ABNORMAL LOW (ref 39.0–52.0)
Hemoglobin: 12 g/dL — ABNORMAL LOW (ref 13.0–17.0)
Immature Granulocytes: 0 %
Lymphocytes Relative: 13 %
Lymphs Abs: 0.8 10*3/uL (ref 0.7–4.0)
MCH: 31.8 pg (ref 26.0–34.0)
MCHC: 34.1 g/dL (ref 30.0–36.0)
MCV: 93.4 fL (ref 80.0–100.0)
Monocytes Absolute: 0.7 10*3/uL (ref 0.1–1.0)
Monocytes Relative: 10 %
Neutro Abs: 4.8 10*3/uL (ref 1.7–7.7)
Neutrophils Relative %: 73 %
Platelets: 166 10*3/uL (ref 150–400)
RBC: 3.77 MIL/uL — ABNORMAL LOW (ref 4.22–5.81)
RDW: 12.7 % (ref 11.5–15.5)
WBC: 6.6 10*3/uL (ref 4.0–10.5)
nRBC: 0 % (ref 0.0–0.2)

## 2020-12-22 LAB — BASIC METABOLIC PANEL
Anion gap: 4 — ABNORMAL LOW (ref 5–15)
BUN: 18 mg/dL (ref 8–23)
CO2: 27 mmol/L (ref 22–32)
Calcium: 8 mg/dL — ABNORMAL LOW (ref 8.9–10.3)
Chloride: 108 mmol/L (ref 98–111)
Creatinine, Ser: 1.29 mg/dL — ABNORMAL HIGH (ref 0.61–1.24)
GFR, Estimated: 57 mL/min — ABNORMAL LOW (ref >60)
Glucose, Bld: 108 mg/dL — ABNORMAL HIGH (ref 70–99)
Potassium: 3.7 mmol/L (ref 3.5–5.1)
Sodium: 139 mmol/L (ref 135–145)

## 2020-12-22 LAB — URINALYSIS, COMPLETE (UACMP) WITH MICROSCOPIC
Bacteria, UA: NONE SEEN
Bilirubin Urine: NEGATIVE
Glucose, UA: NEGATIVE mg/dL
Hgb urine dipstick: NEGATIVE
Ketones, ur: NEGATIVE mg/dL
Nitrite: NEGATIVE
Protein, ur: NEGATIVE mg/dL
Specific Gravity, Urine: 1.015 (ref 1.005–1.030)
Squamous Epithelial / HPF: NONE SEEN (ref 0–5)
WBC, UA: >50 WBC/hpf — ABNORMAL HIGH (ref 0–5)
pH: 6 (ref 5.0–8.0)

## 2020-12-22 MED ORDER — CEPHALEXIN 500 MG PO CAPS: 500.0000 mg | ORAL_CAPSULE | Freq: Four times a day (QID) | ORAL | 0 refills | Status: AC

## 2020-12-22 MED ORDER — SODIUM CHLORIDE 0.9 % IV SOLN
1.0000 g | Freq: Once | INTRAVENOUS | Status: AC
  Administered 2020-12-22: 1 g via INTRAVENOUS
  Filled 2020-12-22: qty 10

## 2020-12-22 NOTE — Discharge Instructions (Addendum)
You are being discharged with a 5-day prescription for Keflex antibiotic.  Take 4 times daily as prescribed and finish all 20 tablets.  The antibiotic we gave you in the IV today will last the entire day, please start taking the Keflex tomorrow on Sunday 6/26.

## 2020-12-22 NOTE — ED Notes (Signed)
Patient given water and crackers. 

## 2020-12-22 NOTE — ED Provider Notes (Signed)
[facility] Emergency Department Provider Note ____________________________________________   Event Date/Time   First MD Initiated Contact with Patient 12/22/20 1132     (approximate)  I have reviewed the triage vital signs and the nursing notes.  HISTORY  Chief Complaint Hypotension   HPI Troy Pugh. is a 80 y.o. malewho presents to the ED for evaluation of syncope  Chart review indicates history of vascular dementia, stroke, right carotid stenosis s/p stent placement.  Remains on Plavix.  Patient presents to the ED via EMS from home for evaluation of a syncopal episode.   Wife provides majority of history, as they were together eating breakfast this morning.  They were outside on the porch of a neighbor's home eating breakfast, wife reports that patient ate a large meal and seemed fine until he went "white as a sheet" while seated and then "just went out."  She reports this lasted maybe a couple minutes before he came back to.  No seizure activity and no postictal period.  He was found to have hypertensive pressures with EMS, initially 69/39, improving to 100/50 with normal saline IV.  Here in the ED, patient reports feeling fine and has no complaints.  Denies pain, headache, chest pain.  He reports he does not know what happened and has no recollection.  Denies preceding prodrome.  Past Medical History:  Diagnosis Date   Carotid stent occlusion ([facility])    High cholesterol    Hypertension    Stroke [facility])    Vascular dementia [facility])     Patient Active Problem List   Diagnosis Date Noted   B12 deficiency 11/11/2019   Vascular dementia with behavior disturbance (Troy Pugh) 07/28/2019   Stroke (Troy Pugh) 01/27/2017   High cholesterol 01/27/2017   Hypertension 01/27/2017   Carotid stenosis 01/27/2017   Personal history of prostate cancer 10/17/2016   Nonrheumatic aortic valve stenosis 06/09/2016   Candida infection of genital region 09/07/2015    Decreased energy 01/04/2015   Benign essential hypertension 06/06/2014   Benign non-nodular prostatic hyperplasia with lower urinary tract symptoms 06/06/2014   Pure hypercholesterolemia 06/06/2014   Benign prostatic hyperplasia without lower urinary tract symptoms 01/20/2014   Gout 01/20/2014   Other and unspecified hyperlipidemia 01/20/2014   Cerebral infarction (Troy Pugh) 01/20/2014   Cerebrovascular disease 01/20/2014   Occlusion and stenosis of carotid artery with cerebral infarction 07/27/2013   Symptomatic carotid artery stenosis with infarction (Troy Pugh) 07/27/2013   Bladder neck obstruction 02/03/2013   Chronic kidney disease, stage III (moderate) (Troy Pugh) 08/11/2012   Incomplete emptying of bladder 08/11/2012   Malignant neoplasm of prostate (Troy Pugh) 08/11/2012   Retention of urine 08/11/2012    Past Surgical History:  Procedure Laterality Date   CATARACT EXTRACTION     GALLBLADDER SURGERY      Prior to Admission medications   Medication Sig Start Date End Date Taking? Authorizing Provider  allopurinol (ZYLOPRIM) 100 MG tablet Take 100 mg by mouth daily.  07/18/13   [provider]  atorvastatin (LIPITOR) 10 MG tablet  06/27/15   [provider]  buPROPion (WELLBUTRIN XL) 150 MG 24 hr tablet Take 150 mg by mouth daily. 08/30/14   [provider]  Calcium Carb-Cholecalciferol ([employer] CALCIUM + D3) 600-500 MG-UNIT CAPS Take 1 tablet by mouth daily.     [provider]  Cholecalciferol (VITAMIN D) 2000 UNITS tablet Take 2,000 Units by mouth daily.    [provider]  clopidogrel (PLAVIX) 75 MG tablet Take 75 mg by  mouth daily.  07/01/13   [provider]  colchicine 0.6 MG tablet Take 0.6 mg by mouth as needed.    [provider]  donepezil (ARICEPT) 5 MG tablet Take by mouth. 11/08/19 11/07/20  [provider]  fexofenadine (ALLEGRA) 180 MG tablet Take 180 mg by mouth as needed.     [provider]  fludrocortisone  (FLORINEF) 0.1 MG tablet  02/16/19   [provider]  fluticasone (FLONASE) 50 MCG/ACT nasal spray as needed.  10/20/16   [provider]  folic acid (FOLVITE) 497 MCG tablet Take 400 mcg by mouth daily.    [provider]  ketoconazole (NIZORAL) 2 % shampoo Apply 1 application topically once a week.    [provider]  nystatin (MYCOSTATIN/NYSTOP) powder Apply topically. 09/07/15   [provider]  pantoprazole (PROTONIX) 20 MG tablet  01/13/17   [provider]  pindolol (VISKEN) 5 MG tablet Take 5 mg by mouth daily. Patient not taking: Reported on 03/21/2020    [provider]  tamsulosin (FLOMAX) 0.4 MG CAPS capsule Take 0.4 mg by mouth daily.    [provider]  venlafaxine XR (EFFEXOR-XR) 75 MG 24 hr capsule  07/21/17   [provider]    Allergies Other, Sulfa antibiotics, and Sulphadimidine [sulfamethazine]  Family History  Problem Relation Age of Onset   Kidney failure Mother    Stroke Father     Social History Social History   Tobacco Use   Smoking status: Never   Smokeless tobacco: Never  Substance Use Topics   Alcohol use: Yes    Alcohol/week: 3.0 standard drinks    Types: 1 Glasses of wine, 1 Cans of beer, 1 Shots of liquor per week    Comment: occasionally   Drug use: No    Review of Systems  Constitutional: No fever/chills positive for syncope Eyes: No visual changes. ENT: No sore throat. Cardiovascular: Denies chest pain. Respiratory: Denies shortness of breath. Gastrointestinal: No abdominal pain.  No nausea, no vomiting.  No diarrhea.  No constipation. Genitourinary: Negative for dysuria. Musculoskeletal: Negative for back pain. Skin: Negative for rash. Neurological: Negative for headaches, focal weakness or numbness.  ____________________________________________   PHYSICAL EXAM:  VITAL SIGNS: Vitals:   12/22/20 1235 12/22/20 1411  BP: 136/61 131/68  Pulse: 79 82   Resp: 18 18  Temp:    SpO2: 98% 97%     Constitutional: Alert and oriented. Well appearing and in no acute distress. Eyes: Conjunctivae are normal. PERRL. EOMI. Head: Atraumatic. Nose: No congestion/rhinnorhea. Mouth/Throat: Mucous membranes are moist.  Oropharynx non-erythematous. Neck: No stridor. No cervical spine tenderness to palpation. Cardiovascular: Normal rate, regular rhythm.Kermit Balo peripheral circulation.  Blowing holosystolic murmur Respiratory: Normal respiratory effort.  No retractions. Lungs CTAB. Gastrointestinal: Soft , nondistended, nontender to palpation. No CVA tenderness. Musculoskeletal: No lower extremity tenderness nor edema.  No joint effusions. No signs of acute trauma. Neurologic:  Normal speech and language. No acute neurologic deficits are appreciated.  Left foot/ankle in a brace due to chronic foot drop on the left and his left hand has baseline contractures.  No evidence of acute deficits Skin:  Skin is warm, dry and intact. No rash noted. Psychiatric: Mood and affect are normal. Speech and behavior are normal.  ____________________________________________   LABS (all labs ordered are listed, but only abnormal results are displayed)  Labs Reviewed  BASIC METABOLIC PANEL - Abnormal; Notable for the following components:      Result  Value   Glucose, Bld 108 (*)    Creatinine, Ser 1.29 (*)    Calcium 8.0 (*)    GFR, Estimated 57 (*)    Anion gap 4 (*)    All other components within normal limits  CBC WITH DIFFERENTIAL/PLATELET - Abnormal; Notable for the following components:   RBC 3.77 (*)    Hemoglobin 12.0 (*)    HCT 35.2 (*)    All other components within normal limits  URINALYSIS, COMPLETE (UACMP) WITH MICROSCOPIC - Abnormal; Notable for the following components:   Color, Urine YELLOW (*)    APPearance HAZY (*)    Leukocytes,Ua LARGE (*)    WBC, UA >50 (*)    All other components within normal limits  URINE CULTURE  TROPONIN I (HIGH  SENSITIVITY)   ____________________________________________  12 Lead EKG  Sinus rhythm, rate of 77 bpm.  Normal axis.  Left bundle.  No ischemic features. ____________________________________________  [place]  ED MD interpretation:    Official radiology report(s): No results found.  ____________________________________________   PROCEDURES and INTERVENTIONS  Procedure(s) performed (including Critical Care):  .1-3 Lead EKG Interpretation  Date/Time: 12/22/2020 2:29 PM Performed by: Vladimir Crofts, MD Authorized by: Vladimir Crofts, MD     Interpretation: normal     ECG rate:  78   ECG rate assessment: normal     Rhythm: sinus rhythm     Ectopy: none     Conduction: normal    Medications  cefTRIAXone (ROCEPHIN) 1 g in sodium chloride 0.9 % 100 mL IVPB (0 g Intravenous Stopped 12/22/20 1340)    ____________________________________________   MDM / ED COURSE   80 year old male presents from home after syncopal episode, found of evidence of acute cystitis, and ultimately amenable to discharge home.  While he was hypotensive with EMS, after their IV fluids, he remains with normal vital signs throughout his stay in the ED.  Exam is reassuring without evidence of acute neurologic deficits.  He looks well without distress.  Blood work is unremarkable.  Urinalysis with infectious features, particularly with his symptoms of increased urinary frequency.  This was sent for culture and he was started on antibiotic to treat acute cystitis.  I note a murmur consistent with the possibility of aortic stenosis on cardiac auscultation, but patient and wife report they recently had an echo as an outpatient with their vascular doctor and do not feel like there have been any recent changes.  Patient reports no chest pain and he has no evidence of ACS at this time.  I discussed with the patient the possibility and my recommendation for observation admission considering his syncope, but he declines and  indicates that he is fine and will go home.  Wife agrees and wants to take him home.  We discussed return precautions prior to discharge.  Clinical Course as of 12/22/20 1431  Sat Dec 22, 2020  1315 Reassessed.  Patient reports feeling well.  We discussed diagnosis of acute cystitis and need for antibiotics.  We discussed other possibilities of etiologies of his syncopal episode.  We discussed the possibilities of cardiac syncope.  He reports that he does not think it is this that he is fine going home.  Wife, who remains at the bedside, agrees that he seems fine and is okay with him coming home. [DS]    Clinical Course User Index [DS] Vladimir Crofts, MD    ____________________________________________   FINAL CLINICAL IMPRESSION(S) / ED DIAGNOSES  Final diagnoses:  Acute cystitis without  hematuria  Syncope, unspecified syncope type     ED Discharge Orders     None        Irvan Tiedt Tamala Julian   Note:  This document was prepared using Dragon voice recognition software and may include unintentional dictation errors.    Vladimir Crofts, MD 12/22/20 [phone]

## 2020-12-22 NOTE — ED Triage Notes (Signed)
Patient arrives via [facility] from home for hypotension. Patient was eating breakfast outside with family when family states he "slumped over." Patient was initially 69/39 and now 105/54 after 1L NaCL with EMS. Patient alert and oriented at this time.

## 2020-12-25 LAB — URINE CULTURE: Culture: >=100000 — AB

## 2021-01-02 ENCOUNTER — Other Ambulatory Visit: Payer: Self-pay

## 2021-01-02 ENCOUNTER — Emergency Department
Admission: EM | Admit: 2021-01-02 | Discharge: 2021-01-02 | Disposition: A | Discharge status: Home / Self Care | Payer: Medicare Other | Attending: Emergency Medicine | Admitting: Emergency Medicine

## 2021-01-02 ENCOUNTER — Emergency Department: Payer: Medicare Other

## 2021-01-02 DIAGNOSIS — I129 Hypertensive chronic kidney disease with stage 1 through stage 4 chronic kidney disease, or unspecified chronic kidney disease: Secondary | ICD-10-CM | POA: Diagnosis not present

## 2021-01-02 DIAGNOSIS — Y9289 Other specified places as the place of occurrence of the external cause: Secondary | ICD-10-CM | POA: Insufficient documentation

## 2021-01-02 DIAGNOSIS — F0151 Vascular dementia with behavioral disturbance: Secondary | ICD-10-CM | POA: Insufficient documentation

## 2021-01-02 DIAGNOSIS — Z7902 Long term (current) use of antithrombotics/antiplatelets: Secondary | ICD-10-CM | POA: Diagnosis not present

## 2021-01-02 DIAGNOSIS — S0181XA Laceration without foreign body of other part of head, initial encounter: Secondary | ICD-10-CM | POA: Insufficient documentation

## 2021-01-02 DIAGNOSIS — S0990XA Unspecified injury of head, initial encounter: Secondary | ICD-10-CM

## 2021-01-02 DIAGNOSIS — Z8546 Personal history of malignant neoplasm of prostate: Secondary | ICD-10-CM | POA: Insufficient documentation

## 2021-01-02 DIAGNOSIS — Z79899 Other long term (current) drug therapy: Secondary | ICD-10-CM | POA: Diagnosis not present

## 2021-01-02 DIAGNOSIS — N183 Chronic kidney disease, stage 3 unspecified: Secondary | ICD-10-CM | POA: Insufficient documentation

## 2021-01-02 DIAGNOSIS — W01198A Fall on same level from slipping, tripping and stumbling with subsequent striking against other object, initial encounter: Secondary | ICD-10-CM | POA: Diagnosis not present

## 2021-01-02 NOTE — ED Notes (Signed)
Pt states that he missed a step and fell hitting his head on the corner of the cabinet, pt went to urgent care where he received staples  But was told to come to the ER for a ct of his head due to him being on blood thinners. Pt denies loc and is alert and oriented at this time, wife at bedside

## 2021-01-02 NOTE — ED Triage Notes (Signed)
Pt was sent from [place] with c/o a trip and fall today striking his head on the steps, pt had laceration repair with staples and sent for a head CT due to pt being on plavix,denies LOC, pt is alert on arrival

## 2021-01-02 NOTE — ED Provider Notes (Signed)
[facility] Emergency Department Provider Note   ____________________________________________   Event Date/Time   First MD Initiated Contact with Patient 01/02/21 2100     (approximate)  I have reviewed the triage vital signs and the nursing notes.   HISTORY  Chief Complaint Head Injury    HPI Troy Pugh. is a 80 y.o. male fell this afternoon.  Reports he was walking, stumbled and hit his head on a piece of furniture.  He has a laceration over the front of the forehead.  He went to urgent care and had that repaired with staples.  He has been doing well since then.  They recommended he come to the ER to have a CT scan to make sure that he did not have any bleeding  Patient denies any significant pain or discomfort other than mild tenderness where his sutures are.  He had quite a bit of bleeding when the injury occurred but no further bleeding since having staples placed.  's been his normal health.  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  No preceding symptoms.  Reports he does fall occasionally, much of this due to chronic left-sided weakness due to previous stroke  Both patient and his wife report they are very familiar with the large cyst that was seen on her CT scan today, and is quite chronic.  Denies any significant headache.  No nausea or vomiting.  Acting and behaving to his normal self  Past Medical History:  Diagnosis Date   Carotid stent occlusion (HCC)    High cholesterol    Hypertension    Stroke [facility])    Vascular dementia [facility])     Patient Active Problem List   Diagnosis Date Noted   B12 deficiency 11/11/2019   Vascular dementia with behavior disturbance ([place]) 07/28/2019   Stroke ([place]) 01/27/2017   High cholesterol 01/27/2017   Hypertension 01/27/2017   Carotid stenosis 01/27/2017   Personal history of prostate cancer 10/17/2016   Nonrheumatic aortic  valve stenosis 06/09/2016   Candida infection of genital region 09/07/2015   Decreased energy 01/04/2015   Benign essential hypertension 06/06/2014   Benign non-nodular prostatic hyperplasia with lower urinary tract symptoms 06/06/2014   Pure hypercholesterolemia 06/06/2014   Benign prostatic hyperplasia without lower urinary tract symptoms 01/20/2014   Gout 01/20/2014   Other and unspecified hyperlipidemia 01/20/2014   Cerebral infarction ([place]) 01/20/2014   Cerebrovascular disease 01/20/2014   Occlusion and stenosis of carotid artery with cerebral infarction 07/27/2013   Symptomatic carotid artery stenosis with infarction ([place]) 07/27/2013   Bladder neck obstruction 02/03/2013   Chronic kidney disease, stage III (moderate) ([place]) 08/11/2012   Incomplete emptying of bladder 08/11/2012   Malignant neoplasm of prostate ([place]) 08/11/2012   Retention of urine 08/11/2012    Past Surgical History:  Procedure Laterality Date   CATARACT EXTRACTION     GALLBLADDER SURGERY      Prior to Admission medications   Medication Sig Start Date End Date Taking? Authorizing Provider  allopurinol (ZYLOPRIM) 100 MG tablet Take 100 mg by mouth daily.  07/18/13   [provider]  atorvastatin (LIPITOR) 10 MG tablet  06/27/15   [provider]  buPROPion (WELLBUTRIN XL) 150 MG 24 hr tablet Take 150 mg by mouth daily. 08/30/14   [provider]  Calcium Carb-Cholecalciferol ([employer] CALCIUM + D3) 600-500 MG-UNIT CAPS Take 1 tablet by mouth  daily.     [provider]  Cholecalciferol (VITAMIN D) 2000 UNITS tablet Take 2,000 Units by mouth daily.    [provider]  clopidogrel (PLAVIX) 75 MG tablet Take 75 mg by mouth daily.  07/01/13   [provider]  colchicine 0.6 MG tablet Take 0.6 mg by mouth as needed.    [provider]  donepezil (ARICEPT) 5 MG tablet Take by mouth. 11/08/19 11/07/20  [provider]  fexofenadine (ALLEGRA) 180 MG tablet  Take 180 mg by mouth as needed.     [provider]  fludrocortisone (FLORINEF) 0.1 MG tablet  02/16/19   [provider]  fluticasone (FLONASE) 50 MCG/ACT nasal spray as needed.  10/20/16   [provider]  folic acid (FOLVITE) 818 MCG tablet Take 400 mcg by mouth daily.    [provider]  ketoconazole (NIZORAL) 2 % shampoo Apply 1 application topically once a week.    [provider]  nystatin (MYCOSTATIN/NYSTOP) powder Apply topically. 09/07/15   [provider]  pantoprazole (PROTONIX) 20 MG tablet  01/13/17   [provider]  pindolol (VISKEN) 5 MG tablet Take 5 mg by mouth daily. Patient not taking: Reported on 03/21/2020    [provider]  tamsulosin (FLOMAX) 0.4 MG CAPS capsule Take 0.4 mg by mouth daily.    [provider]  venlafaxine XR (EFFEXOR-XR) 75 MG 24 hr capsule  07/21/17   [provider]    Allergies Other, Sulfa antibiotics, and Sulphadimidine [sulfamethazine]  Family History  Problem Relation Age of Onset   Kidney failure Mother    Stroke Father     Social History Social History   Tobacco Use   Smoking status: Never   Smokeless tobacco: Never  Substance Use Topics   Alcohol use: Yes    Alcohol/week: 3.0 standard drinks    Types: 1 Glasses of wine, 1 Cans of beer, 1 Shots of liquor per week    Comment: occasionally   Drug use: No    Review of Systems Constitutional: No fever/chills Eyes: No visual changes. ENT: No neck pain or discomfort Cardiovascular: Denies chest pain. Respiratory: Denies shortness of breath. Musculoskeletal: Negative for back pain.  Chronic left arm and left leg weakness, mild. Skin: Negative for rash. Neurological: Negative for new areas of focal weakness or numbness.    ____________________________________________   PHYSICAL EXAM:  VITAL SIGNS: ED Triage Vitals [01/02/21 1714]  Enc Vitals Group     BP 117/60     Pulse Rate 86      Resp 18     Temp 98.2 F (36.8 C)     Temp Source Oral     SpO2 98 %     Weight      Height      Head Circumference      Peak Flow      Pain Score      Pain Loc      Pain Edu?      Excl. in [place]?     Constitutional: Alert and oriented. Well appearing and in no acute distress. Eyes: Conjunctivae are normal. Head: Atraumatic, except for the left forehead where he has mild contusion and approximately 5 staples well approximated with bleeding controlled and appears to be well repaired.. Nose: No congestion/rhinnorhea. Mouth/Throat: Mucous membranes are moist. Neck: No stridor.  No cervical tenderness or discomfort. Cardiovascular: Normal rate, regular rhythm. Grossly normal heart sounds.  Good peripheral circulation. Respiratory: Normal respiratory effort.  No retractions. Lungs CTAB. Gastrointestinal: Soft and nontender. No distention. Musculoskeletal: Good use of right arm right leg.  Strong use of the left lower leg.  Left upper arm held in slight contracted position but demonstrates 4-5 strength.  Both patient and wife report that is chronic not a new finding. Neurologic:  Normal speech and language. No gross focal neurologic deficits are appreciated.  Skin:  Skin is warm, dry and intact. No rash noted. Psychiatric: Mood and affect are normal. Speech and behavior are normal.  ____________________________________________   LABS (all labs ordered are listed, but only abnormal results are displayed)  Labs Reviewed - No data to display ____________________________________________  EKG   ____________________________________________  RADIOLOGY  CT Head Wo Contrast  Result Date: 01/02/2021 CLINICAL DATA:  Headache, intracranial hemorrhage suspected Head trauma, mod-severe Trip and fall striking head on steps.  Laceration with staples. EXAM: CT HEAD WITHOUT CONTRAST TECHNIQUE: Contiguous axial images were obtained from the base of the skull through the vertex without intravenous  contrast. 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 Stable mass effect with partial effacement of the left lateral ventricle and 4 mm left-to-right midline shift. Unchanged encephalomalacia in the inferior right frontal lobe. Remote right basal gangliar infarct unchanged. No acute infarct, hemorrhage, or hydrocephalus. Stable brain volume and periventricular chronic small vessel ischemia. Vascular: Atherosclerosis of skullbase vasculature without hyperdense vessel or abnormal calcification. Skull: No fracture or focal lesion. Sinuses/Orbits: No acute findings. Mild mucosal thickening of the paranasal sinuses. Bilateral cataract resection mastoid air cells are clear. Other: Small left frontal scalp hematoma with skin staples. IMPRESSION: 1. Small left frontal scalp hematoma with skin staples. No acute intracranial abnormality. No skull fracture. 2. 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 3. Unchanged encephalomalacia in the inferior right frontal lobe and right basal ganglia. Electronically Signed   By: Keith Rake M.D.   On: 01/02/2021 18:07     CT imaging reviewed negative for acute intracranial finding. ____________________________________________   PROCEDURES  Procedure(s) performed: None  Procedures  Critical Care performed: No  ____________________________________________   INITIAL IMPRESSION / ASSESSMENT AND PLAN / ED COURSE  Pertinent labs & imaging results that were available during my care of the patient were reviewed by me and considered in my medical decision making (see chart for details).   Patient presents after mechanical fall.  Laceration over the left forehead is already been repaired.  He presents for evaluation to obtain CT to rule out intracranial injury.  CT reassuring.   He is at his normal baseline neurologic status.  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  Return precautions and treatment recommendations and follow-up discussed with the patient who is agreeable with the plan.       ____________________________________________   FINAL CLINICAL IMPRESSION(S) / ED DIAGNOSES  Final diagnoses:  Minor head injury, initial encounter        Note:  This document was prepared using Dragon voice recognition software and may include unintentional dictation errors       Delman Kitten, MD 01/02/21 2144

## 2021-01-24 ENCOUNTER — Encounter: Payer: Self-pay | Admitting: Emergency Medicine

## 2021-01-24 ENCOUNTER — Emergency Department: Payer: Medicare Other

## 2021-01-24 ENCOUNTER — Inpatient Hospital Stay
Admission: EM | Admit: 2021-01-24 | Discharge: 2021-01-26 | DRG: 872 | Disposition: A | Discharge status: Home / Self Care | Payer: Medicare Other | Attending: Internal Medicine | Admitting: Internal Medicine

## 2021-01-24 ENCOUNTER — Other Ambulatory Visit: Payer: Self-pay

## 2021-01-24 DIAGNOSIS — N401 Enlarged prostate with lower urinary tract symptoms: Secondary | ICD-10-CM | POA: Diagnosis present

## 2021-01-24 DIAGNOSIS — N400 Enlarged prostate without lower urinary tract symptoms: Secondary | ICD-10-CM

## 2021-01-24 DIAGNOSIS — K219 Gastro-esophageal reflux disease without esophagitis: Secondary | ICD-10-CM | POA: Diagnosis present

## 2021-01-24 DIAGNOSIS — F01518 Vascular dementia, unspecified severity, with other behavioral disturbance: Secondary | ICD-10-CM | POA: Diagnosis present

## 2021-01-24 DIAGNOSIS — I679 Cerebrovascular disease, unspecified: Secondary | ICD-10-CM | POA: Diagnosis present

## 2021-01-24 DIAGNOSIS — Z823 Family history of stroke: Secondary | ICD-10-CM

## 2021-01-24 DIAGNOSIS — A419 Sepsis, unspecified organism: Principal | ICD-10-CM | POA: Diagnosis present

## 2021-01-24 DIAGNOSIS — R451 Restlessness and agitation: Secondary | ICD-10-CM | POA: Diagnosis present

## 2021-01-24 DIAGNOSIS — D72829 Elevated white blood cell count, unspecified: Secondary | ICD-10-CM | POA: Diagnosis present

## 2021-01-24 DIAGNOSIS — Z20822 Contact with and (suspected) exposure to covid-19: Secondary | ICD-10-CM | POA: Diagnosis present

## 2021-01-24 DIAGNOSIS — Z7902 Long term (current) use of antithrombotics/antiplatelets: Secondary | ICD-10-CM

## 2021-01-24 DIAGNOSIS — R251 Tremor, unspecified: Secondary | ICD-10-CM | POA: Diagnosis present

## 2021-01-24 DIAGNOSIS — F419 Anxiety disorder, unspecified: Secondary | ICD-10-CM | POA: Diagnosis present

## 2021-01-24 DIAGNOSIS — I639 Cerebral infarction, unspecified: Secondary | ICD-10-CM | POA: Diagnosis present

## 2021-01-24 DIAGNOSIS — I10 Essential (primary) hypertension: Secondary | ICD-10-CM | POA: Diagnosis not present

## 2021-01-24 DIAGNOSIS — E7800 Pure hypercholesterolemia, unspecified: Secondary | ICD-10-CM | POA: Diagnosis present

## 2021-01-24 DIAGNOSIS — I2510 Atherosclerotic heart disease of native coronary artery without angina pectoris: Secondary | ICD-10-CM | POA: Diagnosis present

## 2021-01-24 DIAGNOSIS — Z79899 Other long term (current) drug therapy: Secondary | ICD-10-CM

## 2021-01-24 DIAGNOSIS — I447 Left bundle-branch block, unspecified: Secondary | ICD-10-CM | POA: Diagnosis present

## 2021-01-24 DIAGNOSIS — R339 Retention of urine, unspecified: Secondary | ICD-10-CM

## 2021-01-24 DIAGNOSIS — F0151 Vascular dementia with behavioral disturbance: Secondary | ICD-10-CM | POA: Diagnosis present

## 2021-01-24 DIAGNOSIS — R410 Disorientation, unspecified: Secondary | ICD-10-CM | POA: Diagnosis present

## 2021-01-24 DIAGNOSIS — K5732 Diverticulitis of large intestine without perforation or abscess without bleeding: Secondary | ICD-10-CM | POA: Diagnosis present

## 2021-01-24 DIAGNOSIS — Z841 Family history of disorders of kidney and ureter: Secondary | ICD-10-CM

## 2021-01-24 DIAGNOSIS — I69319 Unspecified symptoms and signs involving cognitive functions following cerebral infarction: Secondary | ICD-10-CM

## 2021-01-24 DIAGNOSIS — F32A Depression, unspecified: Secondary | ICD-10-CM | POA: Diagnosis present

## 2021-01-24 DIAGNOSIS — Z66 Do not resuscitate: Secondary | ICD-10-CM | POA: Diagnosis present

## 2021-01-24 DIAGNOSIS — M109 Gout, unspecified: Secondary | ICD-10-CM | POA: Diagnosis present

## 2021-01-24 DIAGNOSIS — Z87891 Personal history of nicotine dependence: Secondary | ICD-10-CM

## 2021-01-24 DIAGNOSIS — R3914 Feeling of incomplete bladder emptying: Secondary | ICD-10-CM

## 2021-01-24 DIAGNOSIS — Z8546 Personal history of malignant neoplasm of prostate: Secondary | ICD-10-CM

## 2021-01-24 DIAGNOSIS — K529 Noninfective gastroenteritis and colitis, unspecified: Secondary | ICD-10-CM | POA: Diagnosis present

## 2021-01-24 DIAGNOSIS — R338 Other retention of urine: Secondary | ICD-10-CM | POA: Diagnosis present

## 2021-01-24 LAB — COMPREHENSIVE METABOLIC PANEL
ALT: 13 U/L (ref 0–44)
AST: 15 U/L (ref 15–41)
Albumin: 3.7 g/dL (ref 3.5–5.0)
Alkaline Phosphatase: 61 U/L (ref 38–126)
Anion gap: 6 (ref 5–15)
BUN: 19 mg/dL (ref 8–23)
CO2: 27 mmol/L (ref 22–32)
Calcium: 8.8 mg/dL — ABNORMAL LOW (ref 8.9–10.3)
Chloride: 106 mmol/L (ref 98–111)
Creatinine, Ser: 0.91 mg/dL (ref 0.61–1.24)
GFR, Estimated: >60 mL/min (ref >60)
Glucose, Bld: 112 mg/dL — ABNORMAL HIGH (ref 70–99)
Potassium: 3.8 mmol/L (ref 3.5–5.1)
Sodium: 139 mmol/L (ref 135–145)
Total Bilirubin: 0.8 mg/dL (ref 0.3–1.2)
Total Protein: 6.2 g/dL — ABNORMAL LOW (ref 6.5–8.1)

## 2021-01-24 LAB — CBC WITH DIFFERENTIAL/PLATELET
Abs Immature Granulocytes: 0.05 10*3/uL (ref 0.00–0.07)
Basophils Absolute: 0 10*3/uL (ref 0.0–0.1)
Basophils Relative: 0 %
Eosinophils Absolute: 0.1 10*3/uL (ref 0.0–0.5)
Eosinophils Relative: 1 %
HCT: 38.4 % — ABNORMAL LOW (ref 39.0–52.0)
Hemoglobin: 13.2 g/dL (ref 13.0–17.0)
Immature Granulocytes: 0 %
Lymphocytes Relative: 4 %
Lymphs Abs: 0.5 10*3/uL — ABNORMAL LOW (ref 0.7–4.0)
MCH: 31.7 pg (ref 26.0–34.0)
MCHC: 34.4 g/dL (ref 30.0–36.0)
MCV: 92.3 fL (ref 80.0–100.0)
Monocytes Absolute: 1.3 10*3/uL — ABNORMAL HIGH (ref 0.1–1.0)
Monocytes Relative: 10 %
Neutro Abs: 11.8 10*3/uL — ABNORMAL HIGH (ref 1.7–7.7)
Neutrophils Relative %: 85 %
Platelets: 198 10*3/uL (ref 150–400)
RBC: 4.16 MIL/uL — ABNORMAL LOW (ref 4.22–5.81)
RDW: 12.6 % (ref 11.5–15.5)
WBC: 13.8 10*3/uL — ABNORMAL HIGH (ref 4.0–10.5)
nRBC: 0 % (ref 0.0–0.2)

## 2021-01-24 LAB — URINALYSIS, COMPLETE (UACMP) WITH MICROSCOPIC
Bacteria, UA: NONE SEEN
Bilirubin Urine: NEGATIVE
Glucose, UA: NEGATIVE mg/dL
Hgb urine dipstick: NEGATIVE
Ketones, ur: NEGATIVE mg/dL
Leukocytes,Ua: NEGATIVE
Nitrite: NEGATIVE
Protein, ur: NEGATIVE mg/dL
Specific Gravity, Urine: 1.018 (ref 1.005–1.030)
Squamous Epithelial / HPF: NONE SEEN (ref 0–5)
pH: 5 (ref 5.0–8.0)

## 2021-01-24 LAB — PROCALCITONIN: Procalcitonin: 1.18 ng/mL

## 2021-01-24 LAB — PROTIME-INR
INR: 1.1 (ref 0.8–1.2)
Prothrombin Time: 14 seconds (ref 11.4–15.2)

## 2021-01-24 LAB — TSH: TSH: 2.705 u[IU]/mL (ref 0.350–4.500)

## 2021-01-24 LAB — APTT: aPTT: 29 seconds (ref 24–36)

## 2021-01-24 LAB — RESP PANEL BY RT-PCR (FLU A&B, COVID) ARPGX2
Influenza A by PCR: NEGATIVE
Influenza B by PCR: NEGATIVE
SARS Coronavirus 2 by RT PCR: NEGATIVE

## 2021-01-24 LAB — LACTIC ACID, PLASMA: Lactic Acid, Venous: 1.5 mmol/L (ref 0.5–1.9)

## 2021-01-24 MED ORDER — LACTATED RINGERS IV BOLUS (SEPSIS)
1000.0000 mL | Freq: Once | INTRAVENOUS | Status: AC
  Administered 2021-01-24: 1000 mL via INTRAVENOUS

## 2021-01-24 MED ORDER — FLUTICASONE PROPIONATE 50 MCG/ACT NA SUSP
2.0000 | Freq: Every day | NASAL | Status: DC | PRN
  Filled 2021-01-24: qty 16

## 2021-01-24 MED ORDER — CIPROFLOXACIN IN D5W 400 MG/200ML IV SOLN
400.0000 mg | Freq: Two times a day (BID) | INTRAVENOUS | Status: DC
  Administered 2021-01-24: 400 mg via INTRAVENOUS
  Filled 2021-01-24: qty 200

## 2021-01-24 MED ORDER — SODIUM CHLORIDE 0.9 % IV SOLN
500.0000 mg | INTRAVENOUS | Status: DC
  Administered 2021-01-24 – 2021-01-25 (×2): 500 mg via INTRAVENOUS
  Filled 2021-01-24 (×4): qty 500

## 2021-01-24 MED ORDER — ACETAMINOPHEN 325 MG PO TABS: 650.0000 mg | ORAL_TABLET | Freq: Four times a day (QID) | ORAL | Status: DC | PRN

## 2021-01-24 MED ORDER — METRONIDAZOLE 500 MG/100ML IV SOLN
500.0000 mg | Freq: Three times a day (TID) | INTRAVENOUS | Status: DC
  Administered 2021-01-25 – 2021-01-26 (×4): 500 mg via INTRAVENOUS
  Filled 2021-01-24 (×6): qty 100

## 2021-01-24 MED ORDER — ATORVASTATIN CALCIUM 10 MG PO TABS
10.0000 mg | ORAL_TABLET | Freq: Every day | ORAL | Status: DC
  Administered 2021-01-24 – 2021-01-25 (×2): 10 mg via ORAL
  Filled 2021-01-24 (×2): qty 1

## 2021-01-24 MED ORDER — METRONIDAZOLE 500 MG/100ML IV SOLN
500.0000 mg | Freq: Once | INTRAVENOUS | Status: AC
  Administered 2021-01-24: 500 mg via INTRAVENOUS
  Filled 2021-01-24: qty 100

## 2021-01-24 MED ORDER — TAMSULOSIN HCL 0.4 MG PO CAPS
0.4000 mg | ORAL_CAPSULE | Freq: Every day | ORAL | Status: DC
  Administered 2021-01-25 – 2021-01-26 (×2): 0.4 mg via ORAL
  Filled 2021-01-24 (×2): qty 1

## 2021-01-24 MED ORDER — ALLOPURINOL 100 MG PO TABS
100.0000 mg | ORAL_TABLET | Freq: Every day | ORAL | Status: DC
  Administered 2021-01-24 – 2021-01-25 (×2): 100 mg via ORAL
  Filled 2021-01-24 (×3): qty 1

## 2021-01-24 MED ORDER — SERTRALINE HCL 50 MG PO TABS
50.0000 mg | ORAL_TABLET | Freq: Every day | ORAL | Status: DC
  Administered 2021-01-25 – 2021-01-26 (×2): 50 mg via ORAL
  Filled 2021-01-24 (×2): qty 1

## 2021-01-24 MED ORDER — HALOPERIDOL LACTATE 5 MG/ML IJ SOLN: 2.0000 mg | Freq: Four times a day (QID) | INTRAMUSCULAR | Status: DC | PRN

## 2021-01-24 MED ORDER — FOLIC ACID 1 MG PO TABS
500.0000 ug | ORAL_TABLET | Freq: Every day | ORAL | Status: DC
  Administered 2021-01-25 – 2021-01-26 (×2): 0.5 mg via ORAL
  Filled 2021-01-24 (×2): qty 1

## 2021-01-24 MED ORDER — IOHEXOL 350 MG/ML SOLN
100.0000 mL | Freq: Once | INTRAVENOUS | Status: AC | PRN
  Administered 2021-01-24: 100 mL via INTRAVENOUS

## 2021-01-24 MED ORDER — LAMOTRIGINE 25 MG PO TABS
50.0000 mg | ORAL_TABLET | Freq: Two times a day (BID) | ORAL | Status: DC
  Administered 2021-01-24 – 2021-01-26 (×4): 50 mg via ORAL
  Filled 2021-01-24 (×4): qty 2

## 2021-01-24 MED ORDER — ACETAMINOPHEN 650 MG RE SUPP: 650.0000 mg | Freq: Four times a day (QID) | RECTAL | Status: DC | PRN

## 2021-01-24 MED ORDER — SODIUM CHLORIDE 0.9 % IV SOLN
2.0000 g | INTRAVENOUS | Status: DC
  Administered 2021-01-24 – 2021-01-25 (×2): 2 g via INTRAVENOUS
  Filled 2021-01-24 (×2): qty 20
  Filled 2021-01-24: qty 2

## 2021-01-24 MED ORDER — ONDANSETRON HCL 4 MG PO TABS: 4.0000 mg | ORAL_TABLET | Freq: Four times a day (QID) | ORAL | Status: DC | PRN

## 2021-01-24 MED ORDER — SODIUM CHLORIDE 0.9 % IV SOLN: 2.0000 g | INTRAVENOUS | Status: DC

## 2021-01-24 MED ORDER — CLOPIDOGREL BISULFATE 75 MG PO TABS
75.0000 mg | ORAL_TABLET | Freq: Every day | ORAL | Status: DC
  Administered 2021-01-25 – 2021-01-26 (×2): 75 mg via ORAL
  Filled 2021-01-24 (×2): qty 1

## 2021-01-24 MED ORDER — ONDANSETRON HCL 4 MG/2ML IJ SOLN: 4.0000 mg | Freq: Four times a day (QID) | INTRAMUSCULAR | Status: DC | PRN

## 2021-01-24 MED ORDER — HALOPERIDOL LACTATE 5 MG/ML IJ SOLN
2.0000 mg | Freq: Once | INTRAMUSCULAR | Status: AC
  Administered 2021-01-24: 2 mg via INTRAVENOUS
  Filled 2021-01-24: qty 1

## 2021-01-24 MED ORDER — DIAZEPAM 5 MG/ML IJ SOLN: 2.5000 mg | Freq: Three times a day (TID) | INTRAMUSCULAR | Status: DC | PRN

## 2021-01-24 MED ORDER — PANTOPRAZOLE SODIUM 20 MG PO TBEC
20.0000 mg | DELAYED_RELEASE_TABLET | Freq: Every day | ORAL | Status: DC
  Administered 2021-01-25 – 2021-01-26 (×2): 20 mg via ORAL
  Filled 2021-01-24 (×4): qty 1

## 2021-01-24 MED ORDER — LACTATED RINGERS IV SOLN: INTRAVENOUS | Status: AC

## 2021-01-24 MED ORDER — FOLIC ACID 0.5 MG HALF TAB
500.0000 ug | ORAL_TABLET | Freq: Every day | ORAL | Status: DC
  Filled 2021-01-24 (×2): qty 1

## 2021-01-24 MED ORDER — ENOXAPARIN SODIUM 40 MG/0.4ML IJ SOSY
40.0000 mg | PREFILLED_SYRINGE | INTRAMUSCULAR | Status: DC
  Administered 2021-01-24 – 2021-01-25 (×2): 40 mg via SUBCUTANEOUS
  Filled 2021-01-24 (×2): qty 0.4

## 2021-01-24 MED ORDER — RIVASTIGMINE TARTRATE 1.5 MG PO CAPS
1.5000 mg | ORAL_CAPSULE | Freq: Two times a day (BID) | ORAL | Status: DC
  Administered 2021-01-24: 1.5 mg via ORAL
  Filled 2021-01-24 (×2): qty 1

## 2021-01-24 NOTE — ED Notes (Signed)
Pt care taken, pt resting 

## 2021-01-24 NOTE — H&P (Signed)
History and Physical   Troy Pugh. TOI:[id number] DOB: 03/07/1941 DOA: 01/24/2021  PCP: Idelle Crouch, MD  Outpatient Specialists: Dr. Jennings Books Patient coming from: Home  I have personally briefly reviewed patient's old medical records in [place].  Chief Concern: Altered mentation and shaking chills  HPI: Troy Pugh. is a 80 y.o. 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  Per patient's spouse at bedside, states that when she came home at approximately 4:15 PM, she noticed patinet was trembling. She denies fever, vomiting, known diarrhea.   At bedside patient was able to tell me his name, his age.  He was not able to tell me the current calendar year.  He identified the current president.  He was agitated.  He urinated in his jeans.  However he refused to let nursing staff remove his pants.  It took much convincing and multiple bouts of yelling and agitation on the part of the patient before his pants could be removed safely.  While he was agitated verbally, he was easily redirectable.  Social history: Lives at home with spouse. He is a former tobacco user, at his peak he smoked 4-5 cigarettes per day and quit about 12 years. He infreqquently consumes etoh, specially 1 beer on warmer evenings. He does not do drugs.  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   Vaccination history: he is vaccinated for covid 19, 2 doses and 1 booster of pfizer  ROS: Unable to complete as patient has vascular dementia with cognitive impairment  ED Course: Discussed with emergency medicine provider, patient requiring hospitalization for chief concerns of confusion in setting of possible sepsis.  Vitals in the emergency  department was remarkable for initial temperature of 102, respiration rate of 22, heart rate of 111, blood pressure 124/71, SPO2 of 98% on room air.  Labs in the emergency department was remarkable for sodium 139, potassium 3.8, chloride 106, bicarb 27, BUN 19, serum creatinine of 0.91, nonfasting blood glucose 112, EGFR greater than 60, WBC 13.8, hemoglobin 13.2, platelets 198.  Lactic acid initially was 1.5.  COVID/influenza A/influenza B PCR were negative.  UA was remarkable for negative leukocytes and nitrates and negative for presence of bacteria.  ED provider ordered azithromycin, ceftriaxone 2 g IV, and ciprofloxacin, lactated Ringer 1 L bolus.  Patient was given 1 dose of Haldol 2 mg IV.  Assessment/Plan  Principal Problem:   Sepsis ([place]) Active Problems:   Hypertension   Benign essential hypertension   Gout   Incomplete emptying of bladder   Vascular dementia with behavior disturbance (HCC)   Cerebral infarction (HCC)   Cerebrovascular disease   BPH (benign prostatic hyperplasia)   GERD (gastroesophageal reflux disease)   # Meet sepsis criteria with fever, increased heart rate, WBC, multiple possible source including colitis, diverticulitis, and pyelonephritis versus cystitis. - CT abdomen and pelvis with contrast was ordered by EDP and read as mild colonic wall thickening at the splenic flexure of the colon in the region of multiple diverticula.  Mild adjacent pericolonic edema and small amount of free fluid in the paracolic gutter.  Findings suspicious for mild acute diverticulitis or less likely focal colitis.  No perforation or abscess.  Mild diffuse bladder wall thickening and mild perivascular fat stranding, suspicious for  cystitis. - Small focal opacity in the anterior right lower lobe with surrounding groundglass. - Checking procalcitonin - I discontinued ciprofloxacin due to multiple side effects including worsening confusion, hypoglycemic coma, tendon rupture  associated with fluoroquinolones - Ceftriaxone IV discontinued by myself, metronidazole 500 mg IV twice daily ordered - Ordered an additional dose of lactated ringer 1 L bolus - Continue LR 150 mL/h  - Blood cultures x2 have been ordered and collected by ED team - Admit to [facility], observation, telemetry ordered for 24 hours  # Hypertension  # Hyperlipidemia-resumed atorvastatin 10 mg nightly # Gout  # Vascular dementia with behavioral disturbance post stroke-patient sees neurologist and per outpatient note on 09/06/2020: Patient was started on rivastigmine 1.5 mg twice daily with meals and Lamictal to help with agitation - Rivastigmine 1.5 mg twice daily with meals has not been resumed as spouse states that patient's neurologist has discontinued this medication - Lamictal 50 mg twice daily resumed  # History of agitation in setting of vascular dementia-patient was prescribed Valium 0.5 tablet as needed for agitation -Resumed Valium 2.5 mg IV every 8 hours as needed for agitation - Haldol 2 mg IV every 6 hours as needed for agitation, 2 doses ordered  # CAD-resumed Plavix 75 mg daily, atorvastatin 10 mg daily # BPH-Flomax 0.4 milligrams daily resumed # Incomplete bladder emptying-resumed Flomax daily # Depression/anxiety-sertraline 50 mg daily resumed # GERD-PPI  Chart reviewed.   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 He  has never used smokeless tobacco. He reports current alcohol use of about 3.0 standard drinks of alcohol per week. He reports that he does not use drugs.  Allergies  Allergen Reactions   Other Hives   Sulfa Antibiotics Hives   Sulphadimidine [Sulfamethazine]    Family History  Problem Relation Age of Onset   Kidney failure Mother    Stroke Father    Family history: Family history reviewed and not pertinent  Prior to Admission medications   Medication Sig Start Date End Date Taking? Authorizing Provider  allopurinol (ZYLOPRIM) 100 MG tablet Take 100 mg by mouth daily.  07/18/13   [provider]  atorvastatin (LIPITOR) 10 MG tablet  06/27/15   [provider]  buPROPion (WELLBUTRIN XL) 150 MG 24 hr tablet Take 150 mg by mouth daily. 08/30/14   [provider]  Calcium Carb-Cholecalciferol ([employer] CALCIUM + D3) 600-500 MG-UNIT CAPS Take 1 tablet by mouth daily.     [provider]  Cholecalciferol (VITAMIN D) 2000 UNITS tablet Take 2,000 Units by mouth daily.    [provider]  clopidogrel (PLAVIX) 75 MG tablet Take 75 mg by mouth daily.  07/01/13   [provider]  colchicine 0.6 MG tablet Take 0.6 mg by mouth as needed.    [provider]  donepezil (ARICEPT) 5 MG tablet Take by mouth. 11/08/19 11/07/20  [provider]  fexofenadine (ALLEGRA) 180 MG tablet Take  180 mg by mouth as needed.     [provider]  fludrocortisone (FLORINEF) 0.1 MG tablet  02/16/19   [provider]  fluticasone (FLONASE) 50 MCG/ACT nasal spray as needed.  10/20/16   [provider]  folic acid (FOLVITE) 786 MCG tablet Take 400 mcg by mouth daily.    [provider]  ketoconazole (NIZORAL) 2 % shampoo Apply 1 application topically once a week.    [provider]  nystatin (MYCOSTATIN/NYSTOP) powder Apply topically. 09/07/15   [provider]  pantoprazole (PROTONIX) 20 MG tablet  01/13/17    [provider]  pindolol (VISKEN) 5 MG tablet Take 5 mg by mouth daily. Patient not taking: Reported on 03/21/2020    [provider]  tamsulosin (FLOMAX) 0.4 MG CAPS capsule Take 0.4 mg by mouth daily.    [provider]  venlafaxine XR (EFFEXOR-XR) 75 MG 24 hr capsule  07/21/17   [provider]   Physical Exam: Vitals:   01/24/21 1800 01/24/21 1900 01/24/21 1930 01/24/21 2028  BP: 124/71 115/68 105/63 105/63  Pulse: (!) 111 97 93 93  Resp: (!) 22 (!) _0 Temp:    98.9 F (37.2 C)  TempSrc:    Oral  SpO2: 98% 92% 93% 94%  Weight:      Height:       Constitutional: appears age-appropriate, frail, NAD, calm, comfortable Eyes: PERRL, lids and conjunctivae normal ENMT: Mucous membranes are moist. Posterior pharynx clear of any exudate or lesions. Age-appropriate dentition. Hearing appropriate Neck: normal, supple, no masses, no thyromegaly Respiratory: clear to auscultation bilaterally, no wheezing, no crackles. Normal respiratory effort. No accessory muscle use.  Cardiovascular: Regular rate and rhythm, no murmurs / rubs / gallops. No extremity edema. 2+ pedal pulses. No carotid bruits.  Abdomen: no tenderness, no masses palpated, no hepatosplenomegaly. Bowel sounds positive.  GU: Patient urinated on himself Musculoskeletal: no clubbing / cyanosis. No joint deformity upper and lower extremities. Good ROM, no contractures, no atrophy. Normal muscle tone.  Skin: no rashes, lesions, ulcers. No induration Neurologic: Sensation intact. Strength 5/5 in all 4.  Psychiatric: Normal judgment and insight. Alert and oriented x 3.  Increased agitation  EKG: independently reviewed, showing sinus tachycardia with rate of 104, QTc 434, left bundle branch block.  Left bundle branch block was present on previous EKG on 12/22/2020.  Chest x-ray on Admission: I personally reviewed and I agree with radiologist reading as below.  CT Abdomen Pelvis W  Contrast  Result Date: 01/24/2021 CLINICAL DATA:  Acute nonlocalized abdominal pain.  Fever. EXAM: CT ABDOMEN AND PELVIS WITH CONTRAST TECHNIQUE: Multidetector CT imaging of the abdomen and pelvis was performed using the standard protocol following bolus administration of intravenous contrast. CONTRAST:  165m OMNIPAQUE IOHEXOL 350 MG/ML SOLN COMPARISON:  Non con chest CT 03/14/2014 FINDINGS: Lower chest: Trace left pleural effusion and basilar atelectasis. Small focal opacity in the anterior right lower lobe with surrounding ground-glass, series 4, image 2. Hepatobiliary: No focal liver abnormality is seen. Cholecystectomy. Common bile duct is not well-defined, however there is no biliary dilatation. Pancreas: No ductal dilatation or inflammation. No evidence of pancreatic mass. Spleen: Normal in size without focal abnormality. Adrenals/Urinary Tract: Normal adrenal glands. Symmetric prominence of both renal collecting systems. No frank hydronephrosis. Parapelvic cysts in the left kidney. No suspicious renal lesion. No visualized stone. Minimal cortical scarring in the lower right kidney. Mild diffuse bladder wall thickening, bladder is partially distended. Suggestion of mild perivesicular  fat stranding. Right bladder wall calcification was seen on prior exam. Stomach/Bowel: Mild colonic wall thickening at the splenic flexure of the colon in the region of multiple diverticula. Mild adjacent pericolonic edema and small amount of free fluid in the pericolic gutter. There multiple additional noninflamed colonic diverticula. Sigmoid colon is redundant small to moderate colonic stool burden. Normal appendix. Fecalization of small bowel contents without obstruction or small bowel inflammation. Decompressed stomach. Vascular/Lymphatic: Moderate aorto bi-iliac atherosclerosis. 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 No portal venous or mesenteric gas.  Patent portal vein. No enlarged lymph nodes in the abdomen or pelvis. Reproductive: Brachytherapy seeds in the prostate Other: No free air. Trace free fluid in the left pericolic gutter and left upper quadrant. No localizing abscess. Minimal fat in both inguinal canals. Musculoskeletal: There are no acute or suspicious osseous abnormalities. Mild lower lumbar degenerative disc disease. IMPRESSION: 1. Mild colonic wall thickening at the splenic flexure of the colon in the region of multiple diverticula. Mild adjacent pericolonic edema and small amount of free fluid in the pericolic gutter. Findings are suspicious for mild acute diverticulitis or less likely focal colitis. No perforation or abscess. 2. Mild diffuse bladder wall thickening with mild perivesicular fat stranding, suspicious for cystitis. Recommend correlation with urinalysis. 3. Small focal opacity in the anterior right lower lobe with surrounding ground-glass. This may represent atelectasis or pneumonia in the appropriate clinical setting. 4. Trace left pleural effusion and basilar atelectasis. 5. Aortic atherosclerosis. 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 No dedicated imaging follow-up is needed due to size. Aortic Atherosclerosis (ICD10-I70.0). Electronically Signed   By: Keith Rake M.D.   On: 01/24/2021 20:21   DG Chest Port 1 View  Result Date: 01/24/2021 CLINICAL DATA:  Questionable sepsis, tremors, favor EXAM: PORTABLE CHEST 1 VIEW COMPARISON:  03/16/2014 chest radiograph. FINDINGS: In Stable cardiomediastinal silhouette with normal heart size. No pneumothorax. No pleural effusion. Lungs appear clear, with no acute consolidative airspace disease and no pulmonary edema. IMPRESSION: No active disease. Electronically Signed   By: Ilona Sorrel M.D.   On: 01/24/2021 18:16    Labs on Admission: I have personally reviewed following labs  CBC: Recent Labs  Lab 01/24/21 1808  WBC 13.8*   NEUTROABS 11.8*  HGB 13.2  HCT 38.4*  MCV 92.3  PLT 161   Basic Metabolic Panel: Recent Labs  Lab 01/24/21 1808  NA 139  K 3.8  CL 106  CO2 27  GLUCOSE 112*  BUN 19  CREATININE 0.91  CALCIUM 8.8*   GFR: Estimated Creatinine Clearance: 66.9 mL/min (by C-G formula based on SCr of 0.91 mg/dL).  Liver Function Tests: Recent Labs  Lab 01/24/21 1808  AST 15  ALT 13  ALKPHOS 61  BILITOT 0.8  PROT 6.2*  ALBUMIN 3.7   Coagulation Profile: Recent Labs  Lab 01/24/21 1808  INR 1.1   Urine analysis:    Component Value Date/Time   COLORURINE YELLOW (A) 01/24/2021 1808   APPEARANCEUR CLEAR (A) 01/24/2021 1808   APPEARANCEUR Clear 03/15/2014 0353   LABSPEC 1.018 01/24/2021 1808   LABSPEC 1.026 03/15/2014 0353   PHURINE 5.0 01/24/2021 [place] 01/24/2021 1808   GLUCOSEU Negative 03/15/2014 0353   HGBUR NEGATIVE 01/24/2021 1808   BILIRUBINUR NEGATIVE 01/24/2021 1808   BILIRUBINUR Negative 03/15/2014 [place] 01/24/2021 [place] NEGATIVE 01/24/2021 1808  UROBILINOGEN 0.2 08/25/2009 0446   NITRITE NEGATIVE 01/24/2021 1808   LEUKOCYTESUR NEGATIVE 01/24/2021 1808   LEUKOCYTESUR Negative 03/15/2014 0353   Dr. Tobie Poet [facility]  If 7PM-7AM, please contact overnight-coverage provider If 7AM-7PM, please contact day coverage provider [email or web address]  01/24/2021, 8:53 PM

## 2021-01-24 NOTE — ED Notes (Signed)
Sitter at bedside.

## 2021-01-24 NOTE — ED Notes (Signed)
Pt returned from CT scan.

## 2021-01-24 NOTE — ED Notes (Signed)
Report given to Lou, RN. 

## 2021-01-24 NOTE — Consult Note (Signed)
CODE SEPSIS - PHARMACY COMMUNICATION  **Broad Spectrum Antibiotics should be administered within 1 hour of Sepsis diagnosis**  Time Code Sepsis Called/Page Received: [id number]  Antibiotics Ordered: [id number]  Time of 1st antibiotic administration: [place] ,PharmD, BCPS Clinical Pharmacist  01/24/2021  6:01 PM

## 2021-01-24 NOTE — Sepsis Progress Note (Signed)
Following for sepsis monitoring ?

## 2021-01-24 NOTE — ED Notes (Signed)
Patient's observed out of bed and refusing to stay in bed. Patient began trying to push nurse away from him. Patient re-directed back into bed with staff assistance.  Patient's wife states, "I have never seen him like this before. " However, wife endorses patient sun-downs.   MD aware of event.   Charge RN aware of possible need for sitter.   TV turned on to distract patient at this time.

## 2021-01-24 NOTE — ED Triage Notes (Signed)
Pt to ED via EMS from home. Per EMS, pt family stated that he started to have tremors for the past few days. Pt is febrile with axilary temp of 101. CBG 122 BP 118/65. Pt was given 975 mg of tylenol. Pt is alert, but disoriented. Pt also has dementia.

## 2021-01-24 NOTE — Sepsis Progress Note (Signed)
Sepsis protocol followed by [facility] 

## 2021-01-25 DIAGNOSIS — F32A Depression, unspecified: Secondary | ICD-10-CM | POA: Diagnosis present

## 2021-01-25 DIAGNOSIS — Z823 Family history of stroke: Secondary | ICD-10-CM | POA: Diagnosis not present

## 2021-01-25 DIAGNOSIS — K529 Noninfective gastroenteritis and colitis, unspecified: Secondary | ICD-10-CM | POA: Diagnosis present

## 2021-01-25 DIAGNOSIS — I69319 Unspecified symptoms and signs involving cognitive functions following cerebral infarction: Secondary | ICD-10-CM | POA: Diagnosis not present

## 2021-01-25 DIAGNOSIS — Z87891 Personal history of nicotine dependence: Secondary | ICD-10-CM | POA: Diagnosis not present

## 2021-01-25 DIAGNOSIS — K219 Gastro-esophageal reflux disease without esophagitis: Secondary | ICD-10-CM | POA: Diagnosis present

## 2021-01-25 DIAGNOSIS — R338 Other retention of urine: Secondary | ICD-10-CM | POA: Diagnosis present

## 2021-01-25 DIAGNOSIS — A419 Sepsis, unspecified organism: Secondary | ICD-10-CM | POA: Diagnosis present

## 2021-01-25 DIAGNOSIS — Z66 Do not resuscitate: Secondary | ICD-10-CM | POA: Diagnosis present

## 2021-01-25 DIAGNOSIS — I2510 Atherosclerotic heart disease of native coronary artery without angina pectoris: Secondary | ICD-10-CM | POA: Diagnosis present

## 2021-01-25 DIAGNOSIS — E7800 Pure hypercholesterolemia, unspecified: Secondary | ICD-10-CM | POA: Diagnosis present

## 2021-01-25 DIAGNOSIS — M109 Gout, unspecified: Secondary | ICD-10-CM | POA: Diagnosis present

## 2021-01-25 DIAGNOSIS — Z20822 Contact with and (suspected) exposure to covid-19: Secondary | ICD-10-CM | POA: Diagnosis present

## 2021-01-25 DIAGNOSIS — Z79899 Other long term (current) drug therapy: Secondary | ICD-10-CM | POA: Diagnosis not present

## 2021-01-25 DIAGNOSIS — R251 Tremor, unspecified: Secondary | ICD-10-CM | POA: Diagnosis present

## 2021-01-25 DIAGNOSIS — F0151 Vascular dementia with behavioral disturbance: Secondary | ICD-10-CM | POA: Diagnosis present

## 2021-01-25 DIAGNOSIS — R451 Restlessness and agitation: Secondary | ICD-10-CM | POA: Diagnosis present

## 2021-01-25 DIAGNOSIS — Z841 Family history of disorders of kidney and ureter: Secondary | ICD-10-CM | POA: Diagnosis not present

## 2021-01-25 DIAGNOSIS — Z7902 Long term (current) use of antithrombotics/antiplatelets: Secondary | ICD-10-CM | POA: Diagnosis not present

## 2021-01-25 DIAGNOSIS — F419 Anxiety disorder, unspecified: Secondary | ICD-10-CM | POA: Diagnosis present

## 2021-01-25 DIAGNOSIS — I10 Essential (primary) hypertension: Secondary | ICD-10-CM | POA: Diagnosis present

## 2021-01-25 DIAGNOSIS — D72829 Elevated white blood cell count, unspecified: Secondary | ICD-10-CM | POA: Diagnosis present

## 2021-01-25 DIAGNOSIS — N401 Enlarged prostate with lower urinary tract symptoms: Secondary | ICD-10-CM | POA: Diagnosis present

## 2021-01-25 DIAGNOSIS — K5732 Diverticulitis of large intestine without perforation or abscess without bleeding: Secondary | ICD-10-CM | POA: Diagnosis present

## 2021-01-25 LAB — PROTIME-INR
INR: 1.2 (ref 0.8–1.2)
Prothrombin Time: 14.9 seconds (ref 11.4–15.2)

## 2021-01-25 LAB — BASIC METABOLIC PANEL
Anion gap: 5 (ref 5–15)
BUN: 13 mg/dL (ref 8–23)
CO2: 29 mmol/L (ref 22–32)
Calcium: 8.5 mg/dL — ABNORMAL LOW (ref 8.9–10.3)
Chloride: 104 mmol/L (ref 98–111)
Creatinine, Ser: 0.9 mg/dL (ref 0.61–1.24)
GFR, Estimated: >60 mL/min (ref >60)
Glucose, Bld: 107 mg/dL — ABNORMAL HIGH (ref 70–99)
Potassium: 3.8 mmol/L (ref 3.5–5.1)
Sodium: 138 mmol/L (ref 135–145)

## 2021-01-25 LAB — CBC
HCT: 34.7 % — ABNORMAL LOW (ref 39.0–52.0)
Hemoglobin: 12.2 g/dL — ABNORMAL LOW (ref 13.0–17.0)
MCH: 32.5 pg (ref 26.0–34.0)
MCHC: 35.2 g/dL (ref 30.0–36.0)
MCV: 92.5 fL (ref 80.0–100.0)
Platelets: 170 10*3/uL (ref 150–400)
RBC: 3.75 MIL/uL — ABNORMAL LOW (ref 4.22–5.81)
RDW: 12.8 % (ref 11.5–15.5)
WBC: 13.3 10*3/uL — ABNORMAL HIGH (ref 4.0–10.5)
nRBC: 0 % (ref 0.0–0.2)

## 2021-01-25 LAB — CORTISOL-AM, BLOOD: Cortisol - AM: 19.2 ug/dL (ref 6.7–22.6)

## 2021-01-25 LAB — VITAMIN B12: Vitamin B-12: 744 pg/mL (ref 180–914)

## 2021-01-25 NOTE — Progress Notes (Signed)
PROGRESS NOTE    Troy Pugh.  WP:[id number] DOB: [birth year] DOA: 01/24/2021 PCP: Idelle Crouch, MD    Brief Narrative:  80 y.o. 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   Per patient's spouse at bedside, states that when she came home at approximately 4:15 PM, she noticed patinet was trembling. She denies fever, vomiting, known diarrhea.    At bedside patient was able to tell me his name, his age.  He was not able to tell me the current calendar year.  He identified the current president.  He was agitated.  He urinated in his jeans.  However he refused to let nursing staff remove his pants.  It took much convincing and multiple bouts of yelling and agitation on the part of the patient before his pants could be removed safely.  While he was agitated verbally, he was easily redirectable.  Patient was admitted under working diagnosis of sepsis secondary to mild acute diverticulitis versus colitis.  Started on intravenous fluids and antibiotic therapy.  Mental status and overall clinical picture is improved substantially since admission.   Assessment & Plan:   Principal Problem:   Sepsis ([place]) Active Problems:   Hypertension   Benign essential hypertension   Gout   Incomplete emptying of bladder   Vascular dementia with behavior disturbance (HCC)   Cerebral infarction (HCC)   Cerebrovascular disease   BPH (benign prostatic hyperplasia)   GERD (gastroesophageal reflux disease)  Sepsis secondary to colitis versus diverticulitis CT abdomen pelvis with contrast shows possible diverticulitis versus colitis versus cystitis Procalcitonin elevated Plan: Continue Rocephin and metronidazole Continue treatment as fluids Okay for diet No surgical  invention Monitor cultures and fever curve Tentative plan to discharge home on 7/30  Hypertension Blood pressure well controlled   Hyperlipidemia Low-dose statin  Gout Not acutely exacerbated  Vascular dementia with behavioral disturbance post stroke patient sees neurologist and per outpatient note on 09/06/2020:  Patient was started on rivastigmine 1.5 mg twice daily with meals and Lamictal to help with agitation Plan: Lamictal 50 mg twice daily Rivastigmine held as patient's wife states neurologist discontinued it   History of agitation in setting of vascular dementia patient was prescribed Valium 0.5 tablet as needed for agitation Plan: As needed Valium and Haldol Patient's mental status improved   # CAD-resumed Plavix 75 mg daily, atorvastatin 10 mg daily # BPH-Flomax 0.4 milligrams daily resumed # Incomplete bladder emptying-resumed Flomax daily # Depression/anxiety-sertraline 50 mg daily resumed # GERD-PPI   DVT prophylaxis: SQ Lovenox Code Status: DNR Family Communication: Wife at bedside  disposition Plan: Status is: Observation  The patient will require care spanning > 2 midnights and should be moved to inpatient because: Inpatient level of care appropriate due to severity of illness  Dispo: The patient is from: Home              Anticipated d/c is to: Home              Patient currently is not medically stable to d/c.   Difficult to place patient No       Level of care: Med-Surg  Consultants:  None  Procedures:  None  Antimicrobials:  Rocephin Metronidazole   Subjective: Seen and examined.  Resting comfortably in bed.  No visible distress.  No pain complaints.  Objective:  Vitals:   01/25/21 0131 01/25/21 0428 01/25/21 0822 01/25/21 1130  BP: 135/70 127/80 125/63 128/67  Pulse: 80 81 77 82  Resp: '16 17 17 17  '$ Temp: 98.4 F (36.9 C) 98.4 F (36.9 C) 97.8 F (36.6 C) 98.9 F (37.2 C)  TempSrc:      SpO2: 98% 97% 97% 99%  Weight:       Height:        Intake/Output Summary (Last 24 hours) at 01/25/2021 1347 Last data filed at 01/25/2021 0400 Gross per 24 hour  Intake 120 ml  Output 300 ml  Net -180 ml   Filed Weights   01/24/21 1742  Weight: 74.8 kg    Examination:  General exam: No acute distress Respiratory system: Clear to auscultation. Respiratory effort normal. Cardiovascular system: S1-S2, regular rate and rhythm, no murmurs, no pedal edema  gastrointestinal system: Soft, nontender, nondistended, hyperactive bowel sounds Central nervous system: Alert, oriented x2, no focal deficits Extremities: Symmetric 5 x 5 power. Skin: No rashes, lesions or ulcers Psychiatry: Judgement and insight appear normal. Mood & affect appropriate.     Data Reviewed: I have personally reviewed following labs and imaging studies  CBC: Recent Labs  Lab 01/24/21 1808 01/25/21 0613  WBC 13.8* 13.3*  NEUTROABS 11.8*  --   HGB 13.2 12.2*  HCT 38.4* 34.7*  MCV 92.3 92.5  PLT 198 [id number]   Basic Metabolic Panel: Recent Labs  Lab 01/24/21 1808 01/25/21 0613  NA 139 138  K 3.8 3.8  CL 106 104  CO2 27 29  GLUCOSE 112* 107*  BUN 19 13  CREATININE 0.91 0.90  CALCIUM 8.8* 8.5*   GFR: Estimated Creatinine Clearance: 67.6 mL/min (by C-G formula based on SCr of 0.9 mg/dL). Liver Function Tests: Recent Labs  Lab 01/24/21 1808  AST 15  ALT 13  ALKPHOS 61  BILITOT 0.8  PROT 6.2*  ALBUMIN 3.7   No results for input(s): LIPASE, AMYLASE in the last 168 hours. No results for input(s): AMMONIA in the last 168 hours. Coagulation Profile: Recent Labs  Lab 01/24/21 1808 01/25/21 0613  INR 1.1 1.2   Cardiac Enzymes: No results for input(s): CKTOTAL, CKMB, CKMBINDEX, TROPONINI in the last 168 hours. BNP (last 3 results) No results for input(s): PROBNP in the last 8760 hours. HbA1C: No results for input(s): HGBA1C in the last 72 hours. CBG: No results for input(s): GLUCAP in the last 168 hours. Lipid Profile: No  results for input(s): CHOL, HDL, LDLCALC, TRIG, CHOLHDL, LDLDIRECT in the last 72 hours. Thyroid Function Tests: Recent Labs    01/24/21 2202  TSH 2.705   Anemia Panel: Recent Labs    01/24/21 2202  VITAMINB12 744   Sepsis Labs: Recent Labs  Lab 01/24/21 1808 01/24/21 2202  PROCALCITON  --  1.18  LATICACIDVEN 1.5  --     Recent Results (from the past 240 hour(s))  Resp Panel by RT-PCR (Flu A&B, Covid) Nasopharyngeal Swab     Status: None   Collection Time: 01/24/21  6:08 PM   Specimen: Nasopharyngeal Swab; Nasopharyngeal(NP) swabs in vial transport medium  Result Value Ref Range Status   SARS Coronavirus 2 by RT PCR NEGATIVE NEGATIVE Final    Comment: (NOTE) SARS-CoV-2 target nucleic acids are NOT DETECTED.  The SARS-CoV-2 RNA is generally detectable in upper respiratory specimens during the acute phase of infection. The lowest concentration of SARS-CoV-2 viral copies this assay can detect is 138 copies/mL. A negative result does not preclude SARS-Cov-2 infection and  should not be used as the sole basis for treatment or other patient management decisions. A negative result may occur with  improper specimen collection/handling, submission of specimen other than nasopharyngeal swab, presence of viral mutation(s) within the areas targeted by this assay, and inadequate number of viral copies(<138 copies/mL). A negative result must be combined with clinical observations, patient history, and epidemiological information. The expected result is Negative.  Fact Sheet for Patients:  EntrepreneurPulse.com.au  Fact Sheet for Healthcare Providers:  IncredibleEmployment.be  This test is no t yet approved or cleared by the [place] FDA and  has been authorized for detection and/or diagnosis of SARS-CoV-2 by FDA under an Emergency Use Authorization (EUA). This EUA will remain  in effect (meaning this test can be used) for the duration of  the COVID-19 declaration under Section 564(b)(1) of the Act, 21 U.S.C.section 360bbb-3(b)(1), unless the authorization is terminated  or revoked sooner.       Influenza A by PCR NEGATIVE NEGATIVE Final   Influenza B by PCR NEGATIVE NEGATIVE Final    Comment: (NOTE) The Xpert Xpress SARS-CoV-2/FLU/RSV plus assay is intended as an aid in the diagnosis of influenza from Nasopharyngeal swab specimens and should not be used as a sole basis for treatment. Nasal washings and aspirates are unacceptable for Xpert Xpress SARS-CoV-2/FLU/RSV testing.  Fact Sheet for Patients: EntrepreneurPulse.com.au  Fact Sheet for Healthcare Providers: IncredibleEmployment.be  This test is not yet approved or cleared by the [place] FDA and has been authorized for detection and/or diagnosis of SARS-CoV-2 by FDA under an Emergency Use Authorization (EUA). This EUA will remain in effect (meaning this test can be used) for the duration of the COVID-19 declaration under Section 564(b)(1) of the Act, 21 U.S.C. section 360bbb-3(b)(1), unless the authorization is terminated or revoked.  Performed at [facility], [address] [place], [place] [address]   Blood Culture (routine x 2)     Status: None (Preliminary result)   Collection Time: 01/24/21  6:08 PM   Specimen: BLOOD  Result Value Ref Range Status   Specimen Description BLOOD BLOOD LEFT WRIST  Final   Special Requests   Final    BOTTLES DRAWN AEROBIC AND ANAEROBIC Blood Culture adequate volume   Culture   Final    NO GROWTH < 12 HOURS Performed at [facility], [address] [place], [place] [address]    Report Status PENDING  Incomplete  Blood Culture (routine x 2)     Status: None (Preliminary result)   Collection Time: 01/24/21  6:08 PM   Specimen: BLOOD  Result Value Ref Range Status   Specimen Description BLOOD BLOOD RIGHT FOREARM  Final   Special Requests   Final    BOTTLES  DRAWN AEROBIC AND ANAEROBIC Blood Culture adequate volume   Culture   Final    NO GROWTH < 12 HOURS Performed at [facility], [address], [place], [place] [address]    Report Status PENDING  Incomplete         Radiology Studies: CT Abdomen Pelvis W Contrast  Result Date: 01/24/2021 CLINICAL DATA:  Acute nonlocalized abdominal pain.  Fever. EXAM: CT ABDOMEN AND PELVIS WITH CONTRAST TECHNIQUE: Multidetector CT imaging of the abdomen and pelvis was performed using the standard protocol following bolus administration of intravenous contrast. CONTRAST:  151m OMNIPAQUE IOHEXOL 350 MG/ML SOLN COMPARISON:  Non con chest CT 03/14/2014 FINDINGS: Lower chest: Trace left pleural effusion and basilar atelectasis. Small focal opacity in the anterior right  lower lobe with surrounding ground-glass, series 4, image 2. Hepatobiliary: No focal liver abnormality is seen. Cholecystectomy. Common bile duct is not well-defined, however there is no biliary dilatation. Pancreas: No ductal dilatation or inflammation. No evidence of pancreatic mass. Spleen: Normal in size without focal abnormality. Adrenals/Urinary Tract: Normal adrenal glands. Symmetric prominence of both renal collecting systems. No frank hydronephrosis. Parapelvic cysts in the left kidney. No suspicious renal lesion. No visualized stone. Minimal cortical scarring in the lower right kidney. Mild diffuse bladder wall thickening, bladder is partially distended. Suggestion of mild perivesicular fat stranding. Right bladder wall calcification was seen on prior exam. Stomach/Bowel: Mild colonic wall thickening at the splenic flexure of the colon in the region of multiple diverticula. Mild adjacent pericolonic edema and small amount of free fluid in the pericolic gutter. There multiple additional noninflamed colonic diverticula. Sigmoid colon is redundant small to moderate colonic stool burden. Normal appendix. Fecalization of small bowel contents  without obstruction or small bowel inflammation. Decompressed stomach. Vascular/Lymphatic: Moderate aorto bi-iliac atherosclerosis. 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 No portal venous or mesenteric gas. Patent portal vein. No enlarged lymph nodes in the abdomen or pelvis. Reproductive: Brachytherapy seeds in the prostate Other: No free air. Trace free fluid in the left pericolic gutter and left upper quadrant. No localizing abscess. Minimal fat in both inguinal canals. Musculoskeletal: There are no acute or suspicious osseous abnormalities. Mild lower lumbar degenerative disc disease. IMPRESSION: 1. Mild colonic wall thickening at the splenic flexure of the colon in the region of multiple diverticula. Mild adjacent pericolonic edema and small amount of free fluid in the pericolic gutter. Findings are suspicious for mild acute diverticulitis or less likely focal colitis. No perforation or abscess. 2. Mild diffuse bladder wall thickening with mild perivesicular fat stranding, suspicious for cystitis. Recommend correlation with urinalysis. 3. Small focal opacity in the anterior right lower lobe with surrounding ground-glass. This may represent atelectasis or pneumonia in the appropriate clinical setting. 4. Trace left pleural effusion and basilar atelectasis. 5. Aortic atherosclerosis. 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 No dedicated imaging follow-up is needed due to size. Aortic Atherosclerosis (ICD10-I70.0). Electronically Signed   By: Keith Rake M.D.   On: 01/24/2021 20:21   DG Chest Port 1 View  Result Date: 01/24/2021 CLINICAL DATA:  Questionable sepsis, tremors, favor EXAM: PORTABLE CHEST 1 VIEW COMPARISON:  03/16/2014 chest radiograph. FINDINGS: In Stable cardiomediastinal silhouette with normal heart size. No pneumothorax. No pleural effusion. Lungs appear clear, with no acute  consolidative airspace disease and no pulmonary edema. IMPRESSION: No active disease. Electronically Signed   By: Ilona Sorrel M.D.   On: 01/24/2021 18:16        Scheduled Meds:  allopurinol  100 mg Oral QHS   atorvastatin  10 mg Oral QHS   clopidogrel  75 mg Oral Daily   enoxaparin (LOVENOX) injection  40 mg Subcutaneous [id number]   folic acid  [id number] mcg Oral Daily   lamoTRIgine  50 mg Oral BID   pantoprazole  20 mg Oral Daily   sertraline  50 mg Oral Daily   tamsulosin  0.4 mg Oral Daily   Continuous Infusions:  azithromycin Stopped (01/24/21 2000)   cefTRIAXone (ROCEPHIN)  IV Stopped (01/24/21 1905)   lactated ringers 150 mL/hr at 01/24/21 1932   metronidazole 500 mg (01/25/21 1228)     LOS: 0 days  Time spent: 25 minutes    Sidney Ace, MD [facility] Pager [phone]  If 7PM-7AM, please contact night-coverage 01/25/2021, 1:47 PM

## 2021-01-25 NOTE — ED Provider Notes (Signed)
[facility] Emergency Department Provider Note   ____________________________________________   Event Date/Time   First MD Initiated Contact with Patient 01/24/21 1718     (approximate)  I have reviewed the triage vital signs and the nursing notes.   HISTORY  Chief Complaint No chief complaint on file.    HPI Troy Pugh. is a 80 y.o. male with below stated past medical history who presents via EMS for worsening altered mental status.  Patient has known history of vascular dementia however patient's wife/caregiver stated that his mental status has declined over the last day stating that he has become much more aggressive.  Patient denies any exacerbating or relieving factors for these symptoms however she does note associated intermittent tremors and subjective fever.  Patient denies any complaints at this time however he is severely limited in his ability to participate in history or review of systems given underlying dementia as well as likely delirium.          Past Medical History:  Diagnosis Date   Carotid stent occlusion (HCC)    High cholesterol    Hypertension    Stroke [facility])    Vascular dementia [facility]     Patient Active Problem List   Diagnosis Date Noted   Sepsis ([place]) 01/24/2021   BPH (benign prostatic hyperplasia) 01/24/2021   GERD (gastroesophageal reflux disease) 01/24/2021   B12 deficiency 11/11/2019   Vascular dementia with behavior disturbance ([place]) 07/28/2019   Stroke ([place]) 01/27/2017   High cholesterol 01/27/2017   Hypertension 01/27/2017   Carotid stenosis 01/27/2017   Personal history of prostate cancer 10/17/2016   Nonrheumatic aortic valve stenosis 06/09/2016   Candida infection of genital region 09/07/2015   Decreased energy 01/04/2015   Benign essential hypertension 06/06/2014   Benign non-nodular prostatic hyperplasia with lower urinary tract symptoms 06/06/2014   Pure hypercholesterolemia 06/06/2014    Benign prostatic hyperplasia without lower urinary tract symptoms 01/20/2014   Gout 01/20/2014   Other and unspecified hyperlipidemia 01/20/2014   Cerebral infarction ([place]) 01/20/2014   Cerebrovascular disease 01/20/2014   Occlusion and stenosis of carotid artery with cerebral infarction 07/27/2013   Symptomatic carotid artery stenosis with infarction ([place]) 07/27/2013   Bladder neck obstruction 02/03/2013   Chronic kidney disease, stage III (moderate) ([place]) 08/11/2012   Incomplete emptying of bladder 08/11/2012   Malignant neoplasm of prostate ([place]) 08/11/2012   Retention of urine 08/11/2012    Past Surgical History:  Procedure Laterality Date   CATARACT EXTRACTION     GALLBLADDER SURGERY      Prior to Admission medications   Medication Sig Start Date End Date Taking? Authorizing Provider  allopurinol (ZYLOPRIM) 100 MG tablet Take 100 mg by mouth at bedtime.   Yes [provider]  atorvastatin (LIPITOR) 10 MG tablet Take 10 mg by mouth daily.   Yes [provider]  Calcium Carb-Cholecalciferol 600-500 MG-UNIT CAPS Take 1 tablet by mouth daily.    Yes [provider]  Cholecalciferol (VITAMIN D) 2000 UNITS tablet Take 2,000 Units by mouth daily.   Yes [provider]  clopidogrel (PLAVIX) 75 MG tablet Take 75 mg by mouth daily.    Yes [provider]  colchicine 0.6 MG tablet Take 0.6-1.2 mg by mouth daily as needed (acute gout flare).   Yes [provider]  fexofenadine (ALLEGRA) 180 MG tablet Take 180 mg by mouth daily as needed for allergies.   Yes [provider]  fluticasone (FLONASE) 50 MCG/ACT nasal spray  Place 2 sprays into both nostrils daily as needed for allergies.   Yes [provider]  folic acid (FOLVITE) A999333 MCG tablet Take 400 mcg by mouth daily.   Yes [provider]  lamoTRIgine (LAMICTAL) 25 MG tablet Take 50 mg by mouth 2 (two) times daily.   Yes [provider]  sertraline  (ZOLOFT) 50 MG tablet Take 50 mg by mouth daily.   Yes [provider]  tamsulosin (FLOMAX) 0.4 MG CAPS capsule Take 0.4 mg by mouth daily.   Yes [provider]    Allergies Other, Sulfa antibiotics, and Sulphadimidine [sulfamethazine]  Family History  Problem Relation Age of Onset   Kidney failure Mother    Stroke Father     Social History Social History   Tobacco Use   Smoking status: Never   Smokeless tobacco: Never  Substance Use Topics   Alcohol use: Yes    Alcohol/week: 3.0 standard drinks    Types: 1 Glasses of wine, 1 Cans of beer, 1 Shots of liquor per week    Comment: occasionally   Drug use: No    Review of Systems Unable to assess ____________________________________________   PHYSICAL EXAM:  VITAL SIGNS: ED Triage Vitals  Enc Vitals Group     BP 01/24/21 1718 115/69     Pulse Rate 01/24/21 1718 (!) 106     Resp 01/24/21 1718 18     Temp 01/24/21 1718 (!) 102.1 F (38.9 C)     Temp Source 01/24/21 1718 Oral     SpO2 01/24/21 1718 96 %     Weight 01/24/21 1742 165 lb (74.8 kg)     Height 01/24/21 1742 '5\' 9"'$  (1.753 m)     Head Circumference --      Peak Flow --      Pain Score 01/24/21 1742 0     Pain Loc --      Pain Edu? --      Excl. in [place]? --    Constitutional: Alert and disoriented directable. Well appearing and in no acute distress. Eyes: Conjunctivae are normal. PERRL. Head: Atraumatic. Nose: No congestion/rhinnorhea. Mouth/Throat: Mucous membranes are moist. Neck: No stridor Cardiovascular: Grossly normal heart sounds.  Good peripheral circulation. Respiratory: Normal respiratory effort.  No retractions. Gastrointestinal: Soft and tenderness with guarding to bilateral lower quadrants with left greater than right. No distention. Musculoskeletal: No obvious deformities Neurologic:  Normal speech and language. No gross focal neurologic deficits are appreciated. Skin:  Skin is warm and dry. No rash noted. Psychiatric:  Intermittently agitated and uncooperative but redirectable  ____________________________________________   LABS (all labs ordered are listed, but only abnormal results are displayed)  Labs Reviewed  COMPREHENSIVE METABOLIC PANEL - Abnormal; Notable for the following components:      Result Value   Glucose, Bld 112 (*)    Calcium 8.8 (*)    Total Protein 6.2 (*)    All other components within normal limits  CBC WITH DIFFERENTIAL/PLATELET - Abnormal; Notable for the following components:   WBC 13.8 (*)    RBC 4.16 (*)    HCT 38.4 (*)    Neutro Abs 11.8 (*)    Lymphs Abs 0.5 (*)    Monocytes Absolute 1.3 (*)    All other components within normal limits  URINALYSIS, COMPLETE (UACMP) WITH MICROSCOPIC - Abnormal; Notable for the following components:   Color, Urine YELLOW (*)    APPearance CLEAR (*)    All other components within normal limits  CBC - Abnormal; Notable for the following components:   WBC 13.3 (*)    RBC 3.75 (*)    Hemoglobin 12.2 (*)    HCT 34.7 (*)    All other components within normal limits  BASIC METABOLIC PANEL - Abnormal; Notable for the following components:   Glucose, Bld 107 (*)    Calcium 8.5 (*)    All other components within normal limits  RESP PANEL BY RT-PCR (FLU A&B, COVID) ARPGX2  CULTURE, BLOOD (ROUTINE X 2)  CULTURE, BLOOD (ROUTINE X 2)  URINE CULTURE  GASTROINTESTINAL PANEL BY PCR, STOOL (REPLACES STOOL CULTURE)  LACTIC ACID, PLASMA  PROTIME-INR  APTT  PROTIME-INR  CORTISOL-AM, BLOOD  PROCALCITONIN  TSH  VITAMIN B12   ____________________________________________  EKG  ED ECG REPORT I, Naaman Plummer, the attending physician, personally viewed and interpreted this ECG.  Date: 01/25/2021 EKG Time: 1717 Rate: 104 Rhythm: Tachycardic sinus rhythm QRS Axis: normal Intervals: Left bundle branch block ST/T Wave abnormalities: normal Narrative Interpretation: Left bundle branch block.  No evidence of acute  ischemia  ____________________________________________  RADIOLOGY  ED MD interpretation: CT of the abdomen and pelvis with IV contrast shows mild colonic wall thickening at the splenic flexure of the colon with multiple diverticula and multiple adjacent pericolonic edema concerning for possible diverticulitis versus colitis.  There is also evidence of bladder wall thickening concerning for possible urinary tract infection  Official radiology report(s): CT Abdomen Pelvis W Contrast  Result Date: 01/24/2021 CLINICAL DATA:  Acute nonlocalized abdominal pain.  Fever. EXAM: CT ABDOMEN AND PELVIS WITH CONTRAST TECHNIQUE: Multidetector CT imaging of the abdomen and pelvis was performed using the standard protocol following bolus administration of intravenous contrast. CONTRAST:  176m OMNIPAQUE IOHEXOL 350 MG/ML SOLN COMPARISON:  Non con chest CT 03/14/2014 FINDINGS: Lower chest: Trace left pleural effusion and basilar atelectasis. Small focal opacity in the anterior right lower lobe with surrounding ground-glass, series 4, image 2. Hepatobiliary: No focal liver abnormality is seen. Cholecystectomy. Common bile duct is not well-defined, however there is no biliary dilatation. Pancreas: No ductal dilatation or inflammation. No evidence of pancreatic mass. Spleen: Normal in size without focal abnormality. Adrenals/Urinary Tract: Normal adrenal glands. Symmetric prominence of both renal collecting systems. No frank hydronephrosis. Parapelvic cysts in the left kidney. No suspicious renal lesion. No visualized stone. Minimal cortical scarring in the lower right kidney. Mild diffuse bladder wall thickening, bladder is partially distended. Suggestion of mild perivesicular fat stranding. Right bladder wall calcification was seen on prior exam. Stomach/Bowel: Mild colonic wall thickening at the splenic flexure of the colon in the region of multiple diverticula. Mild adjacent pericolonic edema and small amount of free  fluid in the pericolic gutter. There multiple additional noninflamed colonic diverticula. Sigmoid colon is redundant small to moderate colonic stool burden. Normal appendix. Fecalization of small bowel contents without obstruction or small bowel inflammation. Decompressed stomach. Vascular/Lymphatic: Moderate aorto bi-iliac atherosclerosis. 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 No portal venous or mesenteric gas. Patent portal vein. No enlarged lymph nodes in the abdomen or pelvis. Reproductive: Brachytherapy seeds in the prostate Other: No free air. Trace free fluid in the left pericolic gutter and left upper quadrant. No localizing abscess. Minimal fat in both inguinal canals. Musculoskeletal: There are no acute or suspicious osseous abnormalities. Mild lower lumbar degenerative disc disease. IMPRESSION: 1. Mild colonic wall thickening at the splenic flexure of the colon in the region of multiple  diverticula. Mild adjacent pericolonic edema and small amount of free fluid in the pericolic gutter. Findings are suspicious for mild acute diverticulitis or less likely focal colitis. No perforation or abscess. 2. Mild diffuse bladder wall thickening with mild perivesicular fat stranding, suspicious for cystitis. Recommend correlation with urinalysis. 3. Small focal opacity in the anterior right lower lobe with surrounding ground-glass. This may represent atelectasis or pneumonia in the appropriate clinical setting. 4. Trace left pleural effusion and basilar atelectasis. 5. Aortic atherosclerosis. 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 No dedicated imaging follow-up is needed due to size. Aortic Atherosclerosis (ICD10-I70.0). Electronically Signed   By: Keith Rake M.D.   On: 01/24/2021 20:21   DG Chest Port 1 View  Result Date: 01/24/2021 CLINICAL DATA:  Questionable sepsis, tremors, favor EXAM:  PORTABLE CHEST 1 VIEW COMPARISON:  03/16/2014 chest radiograph. FINDINGS: In Stable cardiomediastinal silhouette with normal heart size. No pneumothorax. No pleural effusion. Lungs appear clear, with no acute consolidative airspace disease and no pulmonary edema. IMPRESSION: No active disease. Electronically Signed   By: Ilona Sorrel M.D.   On: 01/24/2021 18:16    ____________________________________________   PROCEDURES  Procedure(s) performed (including Critical Care):  .1-3 Lead EKG Interpretation  Date/Time: 01/25/2021 4:35 PM Performed by: Naaman Plummer, MD Authorized by: Naaman Plummer, MD     Interpretation: normal     ECG rate:  77   ECG rate assessment: normal     Rhythm: sinus rhythm     Ectopy: none     Conduction: normal   .Critical Care  Date/Time: 01/25/2021 4:35 PM Performed by: Naaman Plummer, MD Authorized by: Naaman Plummer, MD   Critical care provider statement:    Critical care time (minutes):  35   Critical care time was exclusive of:  Separately billable procedures and treating other patients   Critical care was necessary to treat or prevent imminent or life-threatening deterioration of the following conditions:  Sepsis   Critical care was time spent personally by me on the following activities:  Discussions with consultants, evaluation of patient's response to treatment, examination of patient, ordering and performing treatments and interventions, ordering and review of laboratory studies, ordering and review of radiographic studies, pulse oximetry, re-evaluation of patient's condition, obtaining history from patient or surrogate and review of old charts   I assumed direction of critical care for this patient from another provider in my specialty: no     Care discussed with: admitting provider     ____________________________________________   INITIAL IMPRESSION / ASSESSMENT AND PLAN / ED COURSE  As part of my medical decision making, I reviewed the  following data within the electronic medical record, if available:  Nursing notes reviewed and incorporated, Labs reviewed, EKG interpreted, Old chart reviewed, Radiograph reviewed and Notes from prior ED visits reviewed and incorporated      Patient has diverticulitis that is possibly causing patient's delirium Patient has no peritoneal signs or signs of perforation. Patients symptoms not typical for other emergent causes of abdominal pain such as, but not limited to, appendicitis, abdominal aortic aneurysm, surgical biliary disease, acute coronary syndrome, etc.  However given that patient is acutely altered from his baseline with evidence of infection, concern for delirium and therefore will require admission to the internal medicine service for further evaluation and management  Dispo: Admit to medicine      ____________________________________________   FINAL CLINICAL IMPRESSION(S) / ED DIAGNOSES  Final diagnoses:  Diverticulitis of colon  Acute delirium  Sepsis without acute organ dysfunction, due to unspecified organism [facility])     ED Discharge Orders     None        Note:  This document was prepared using Dragon voice recognition software and may include unintentional dictation errors.    Naaman Plummer, MD 01/25/21 [phone]

## 2021-01-25 NOTE — Plan of Care (Signed)
Admitted for sepsis and AMS. IV abx administered. VSS. Bed low. Wheels locked. Bed alarm activated.

## 2021-01-26 DIAGNOSIS — A419 Sepsis, unspecified organism: Secondary | ICD-10-CM | POA: Diagnosis not present

## 2021-01-26 LAB — CBC WITH DIFFERENTIAL/PLATELET
Abs Immature Granulocytes: 0.03 10*3/uL (ref 0.00–0.07)
Basophils Absolute: 0 10*3/uL (ref 0.0–0.1)
Basophils Relative: 0 %
Eosinophils Absolute: 0.2 10*3/uL (ref 0.0–0.5)
Eosinophils Relative: 2 %
HCT: 37.2 % — ABNORMAL LOW (ref 39.0–52.0)
Hemoglobin: 12.7 g/dL — ABNORMAL LOW (ref 13.0–17.0)
Immature Granulocytes: 0 %
Lymphocytes Relative: 14 %
Lymphs Abs: 1.2 10*3/uL (ref 0.7–4.0)
MCH: 31.7 pg (ref 26.0–34.0)
MCHC: 34.1 g/dL (ref 30.0–36.0)
MCV: 92.8 fL (ref 80.0–100.0)
Monocytes Absolute: 1 10*3/uL (ref 0.1–1.0)
Monocytes Relative: 11 %
Neutro Abs: 6.3 10*3/uL (ref 1.7–7.7)
Neutrophils Relative %: 73 %
Platelets: 192 10*3/uL (ref 150–400)
RBC: 4.01 MIL/uL — ABNORMAL LOW (ref 4.22–5.81)
RDW: 12.5 % (ref 11.5–15.5)
WBC: 8.8 10*3/uL (ref 4.0–10.5)
nRBC: 0 % (ref 0.0–0.2)

## 2021-01-26 LAB — URINE CULTURE: Culture: <10000 — AB

## 2021-01-26 LAB — BASIC METABOLIC PANEL
Anion gap: 8 (ref 5–15)
BUN: 14 mg/dL (ref 8–23)
CO2: 28 mmol/L (ref 22–32)
Calcium: 8.8 mg/dL — ABNORMAL LOW (ref 8.9–10.3)
Chloride: 101 mmol/L (ref 98–111)
Creatinine, Ser: 0.88 mg/dL (ref 0.61–1.24)
GFR, Estimated: >60 mL/min (ref >60)
Glucose, Bld: 91 mg/dL (ref 70–99)
Potassium: 4 mmol/L (ref 3.5–5.1)
Sodium: 137 mmol/L (ref 135–145)

## 2021-01-26 MED ORDER — SODIUM CHLORIDE 0.9 % IV SOLN
INTRAVENOUS | Status: DC | PRN
  Administered 2021-01-26: 500 mL via INTRAVENOUS

## 2021-01-26 MED ORDER — SENNOSIDES-DOCUSATE SODIUM 8.6-50 MG PO TABS: 1.0000 | ORAL_TABLET | Freq: Every day | ORAL | 0 refills | Status: AC

## 2021-01-26 MED ORDER — AMOXICILLIN-POT CLAVULANATE 875-125 MG PO TABS: 1.0000 | ORAL_TABLET | Freq: Two times a day (BID) | ORAL | 0 refills | Status: AC

## 2021-01-26 NOTE — Discharge Summary (Signed)
Physician Discharge Summary  Troy Pugh. GQ:[id number] DOB: December 14, 1940 DOA: 01/24/2021  PCP: Idelle Crouch, MD  Admit date: 01/24/2021 Discharge date: 01/26/2021  Admitted From: Home Disposition: Home  Recommendations for Outpatient Follow-up:  Follow up with PCP in 1-2 weeks   Home Health: No Equipment/Devices: None  Discharge Condition: Stable CODE STATUS: Full Diet recommendation: Regular  Brief/Interim Summary: 80 y.o. 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   Per patient's spouse at bedside, states that when she came home at approximately 4:15 PM, she noticed patinet was trembling. She denies fever, vomiting, known diarrhea.    At bedside patient was able to tell me his name, his age.  He was not able to tell me the current calendar year.  He identified the current president.  He was agitated.  He urinated in his jeans.  However he refused to let nursing staff remove his pants.  It took much convincing and multiple bouts of yelling and agitation on the part of the patient before his pants could be removed safely.  While he was agitated verbally, he was easily redirectable.   Patient was admitted under working diagnosis of sepsis secondary to mild acute diverticulitis versus colitis.  Started on intravenous fluids and antibiotic therapy.  Mental status and overall clinical picture is improved substantially since admission.  On day of discharge had a lengthy discussion with patient and wife at bedside.  Patient is back at baseline level of mentation.  He is been afebrile and leukocytosis has resolved.  Suspected multifactorial etiology of patient's presentation.  Suspect diverticulitis versus cystitis.  Will convert to Augmentin to complete total  7-day antibiotic course.  Discharge Diagnoses:  Principal Problem:   Sepsis ([place]) Active Problems:   Hypertension   Benign essential hypertension   Gout   Incomplete emptying of bladder   Vascular dementia with behavior disturbance (HCC)   Cerebral infarction (HCC)   Cerebrovascular disease   BPH (benign prostatic hyperplasia)   GERD (gastroesophageal reflux disease)  Sepsis secondary to colitis versus diverticulitis CT abdomen pelvis with contrast shows possible diverticulitis versus colitis versus cystitis Procalcitonin elevated Responded within 24 hours IV antibiotic therapy Tolerating p.o. diet on day of discharge Can discharge home at this time.  Convert to Augmentin.  Complete total 7-day antibiotic course.   Hypertension Blood pressure well controlled   Hyperlipidemia Low-dose statin  Gout Not acutely exacerbated  Vascular dementia with behavioral disturbance post stroke patient sees neurologist and per outpatient note on 09/06/2020: Patient was started on rivastigmine 1.5 mg twice daily with meals and Lamictal to help with agitation Plan: Lamictal 50 mg twice daily Rivastigmine held as patient's wife states neurologist discontinued it  Discharge Instructions  Discharge Instructions     Diet - low sodium heart healthy   Complete by: As directed    Increase activity slowly   Complete by: As directed       Allergies as of 01/26/2021       Reactions   Other Hives   Sulfa Antibiotics Hives   Sulphadimidine [sulfamethazine]         Medication List     TAKE these medications    allopurinol 100 MG tablet Commonly known as: ZYLOPRIM Take 100 mg by mouth at bedtime.   amoxicillin-clavulanate 875-125 MG tablet Commonly known as: Augmentin Take  1 tablet by mouth 2 (two) times daily for 6 days. Start taking on: January 27, 2021   atorvastatin 10 MG tablet Commonly known as: LIPITOR Take 10 mg by mouth daily.   Calcium Carb-Cholecalciferol 600-500  MG-UNIT Caps Take 1 tablet by mouth daily.   clopidogrel 75 MG tablet Commonly known as: PLAVIX Take 75 mg by mouth daily.   colchicine 0.6 MG tablet Take 0.6-1.2 mg by mouth daily as needed (acute gout flare).   fexofenadine 180 MG tablet Commonly known as: ALLEGRA Take 180 mg by mouth daily as needed for allergies.   fluticasone 50 MCG/ACT nasal spray Commonly known as: FLONASE Place 2 sprays into both nostrils daily as needed for allergies.   folic acid A999333 MCG tablet Commonly known as: FOLVITE Take 400 mcg by mouth daily.   lamoTRIgine 25 MG tablet Commonly known as: LAMICTAL Take 50 mg by mouth 2 (two) times daily.   senna-docusate 8.6-50 MG tablet Commonly known as: Senokot-S Take 1 tablet by mouth daily.   sertraline 50 MG tablet Commonly known as: ZOLOFT Take 50 mg by mouth daily.   tamsulosin 0.4 MG Caps capsule Commonly known as: FLOMAX Take 0.4 mg by mouth daily.   Vitamin D 50 MCG (2000 UT) tablet Take 2,000 Units by mouth daily.        Allergies  Allergen Reactions   Other Hives   Sulfa Antibiotics Hives   Sulphadimidine [Sulfamethazine]     Consultations: None   Procedures/Studies: CT Head Wo Contrast  Result Date: 01/02/2021 CLINICAL DATA:  Headache, intracranial hemorrhage suspected Head trauma, mod-severe Trip and fall striking head on steps.  Laceration with staples. EXAM: CT HEAD WITHOUT CONTRAST TECHNIQUE: Contiguous axial images were obtained from the base of the skull through the vertex without intravenous contrast. 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 Stable mass effect with partial effacement of the left lateral ventricle and 4 mm left-to-right midline shift. Unchanged encephalomalacia in the inferior right frontal lobe. Remote right basal gangliar infarct unchanged. No acute infarct, hemorrhage, or  hydrocephalus. Stable brain volume and periventricular chronic small vessel ischemia. Vascular: Atherosclerosis of skullbase vasculature without hyperdense vessel or abnormal calcification. Skull: No fracture or focal lesion. Sinuses/Orbits: No acute findings. Mild mucosal thickening of the paranasal sinuses. Bilateral cataract resection mastoid air cells are clear. Other: Small left frontal scalp hematoma with skin staples. IMPRESSION: 1. Small left frontal scalp hematoma with skin staples. No acute intracranial abnormality. No skull fracture. 2. 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 3. Unchanged encephalomalacia in the inferior right frontal lobe and right basal ganglia. Electronically Signed   By: Keith Rake M.D.   On: 01/02/2021 18:07   CT Abdomen Pelvis W Contrast  Result Date: 01/24/2021 CLINICAL DATA:  Acute nonlocalized abdominal pain.  Fever. EXAM: CT ABDOMEN AND PELVIS WITH CONTRAST TECHNIQUE: Multidetector CT imaging of the abdomen and pelvis was performed using the standard protocol following bolus administration of intravenous contrast. CONTRAST:  146m OMNIPAQUE IOHEXOL 350 MG/ML SOLN COMPARISON:  Non con chest CT 03/14/2014 FINDINGS: Lower chest: Trace left pleural effusion and basilar atelectasis. Small focal opacity in the anterior right lower lobe with surrounding ground-glass, series 4, image 2. Hepatobiliary: No focal liver abnormality is seen. Cholecystectomy. Common bile duct is not well-defined, however there is no biliary dilatation. Pancreas: No ductal dilatation  or inflammation. No evidence of pancreatic mass. Spleen: Normal in size without focal abnormality. Adrenals/Urinary Tract: Normal adrenal glands. Symmetric prominence of both renal collecting systems. No frank hydronephrosis. Parapelvic cysts in the left kidney. No suspicious renal lesion. No visualized stone. Minimal cortical scarring in the lower  right kidney. Mild diffuse bladder wall thickening, bladder is partially distended. Suggestion of mild perivesicular fat stranding. Right bladder wall calcification was seen on prior exam. Stomach/Bowel: Mild colonic wall thickening at the splenic flexure of the colon in the region of multiple diverticula. Mild adjacent pericolonic edema and small amount of free fluid in the pericolic gutter. There multiple additional noninflamed colonic diverticula. Sigmoid colon is redundant small to moderate colonic stool burden. Normal appendix. Fecalization of small bowel contents without obstruction or small bowel inflammation. Decompressed stomach. Vascular/Lymphatic: Moderate aorto bi-iliac atherosclerosis. 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 No portal venous or mesenteric gas. Patent portal vein. No enlarged lymph nodes in the abdomen or pelvis. Reproductive: Brachytherapy seeds in the prostate Other: No free air. Trace free fluid in the left pericolic gutter and left upper quadrant. No localizing abscess. Minimal fat in both inguinal canals. Musculoskeletal: There are no acute or suspicious osseous abnormalities. Mild lower lumbar degenerative disc disease. IMPRESSION: 1. Mild colonic wall thickening at the splenic flexure of the colon in the region of multiple diverticula. Mild adjacent pericolonic edema and small amount of free fluid in the pericolic gutter. Findings are suspicious for mild acute diverticulitis or less likely focal colitis. No perforation or abscess. 2. Mild diffuse bladder wall thickening with mild perivesicular fat stranding, suspicious for cystitis. Recommend correlation with urinalysis. 3. Small focal opacity in the anterior right lower lobe with surrounding ground-glass. This may represent atelectasis or pneumonia in the appropriate clinical setting. 4. Trace left pleural effusion and basilar atelectasis. 5. Aortic atherosclerosis.  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 No dedicated imaging follow-up is needed due to size. Aortic Atherosclerosis (ICD10-I70.0). Electronically Signed   By: Keith Rake M.D.   On: 01/24/2021 20:21   DG Chest Port 1 View  Result Date: 01/24/2021 CLINICAL DATA:  Questionable sepsis, tremors, favor EXAM: PORTABLE CHEST 1 VIEW COMPARISON:  03/16/2014 chest radiograph. FINDINGS: In Stable cardiomediastinal silhouette with normal heart size. No pneumothorax. No pleural effusion. Lungs appear clear, with no acute consolidative airspace disease and no pulmonary edema. IMPRESSION: No active disease. Electronically Signed   By: Ilona Sorrel M.D.   On: 01/24/2021 18:16   (Echo, Carotid, EGD, Colonoscopy, ERCP)    Subjective: Seen and examined on the day of discharge.  Tolerating p.o. intake.  No abdominal pain.  Mental status at baseline.  Stable for discharge home.  Discharge Exam: Vitals:   01/26/21 0807 01/26/21 1206  BP: (!) 160/85 (!) 149/79  Pulse: 82 80  Resp: 16 18  Temp: 98.2 F (36.8 C) (!) 97.5 F (36.4 C)  SpO2: 98% 99%   Vitals:   01/25/21 1949 01/26/21 0330 01/26/21 0807 01/26/21 1206  BP: 121/69 (!) 158/86 (!) 160/85 (!) 149/79  Pulse: 83 81 82 80  Resp: '20 18 16 18  '$ Temp: 99.5 F (37.5 C) 98.2 F (36.8 C) 98.2 F (36.8 C) (!) 97.5 F (36.4 C)  TempSrc: Oral Oral  Oral  SpO2: 98% 96% 98% 99%  Weight:      Height:        General: Pt is  alert, awake, not in acute distress Cardiovascular: RRR, S1/S2 +, no rubs, no gallops Respiratory: CTA bilaterally, no wheezing, no rhonchi Abdominal: Soft, NT, ND, bowel sounds + Extremities: no edema, no cyanosis    The results of significant diagnostics from this hospitalization (including imaging, microbiology, ancillary and laboratory) are listed below for reference.     Microbiology: Recent Results (from the past 240 hour(s))  Resp Panel by RT-PCR (Flu A&B, Covid)  Nasopharyngeal Swab     Status: None   Collection Time: 01/24/21  6:08 PM   Specimen: Nasopharyngeal Swab; Nasopharyngeal(NP) swabs in vial transport medium  Result Value Ref Range Status   SARS Coronavirus 2 by RT PCR NEGATIVE NEGATIVE Final    Comment: (NOTE) SARS-CoV-2 target nucleic acids are NOT DETECTED.  The SARS-CoV-2 RNA is generally detectable in upper respiratory specimens during the acute phase of infection. The lowest concentration of SARS-CoV-2 viral copies this assay can detect is 138 copies/mL. A negative result does not preclude SARS-Cov-2 infection and should not be used as the sole basis for treatment or other patient management decisions. A negative result may occur with  improper specimen collection/handling, submission of specimen other than nasopharyngeal swab, presence of viral mutation(s) within the areas targeted by this assay, and inadequate number of viral copies(<138 copies/mL). A negative result must be combined with clinical observations, patient history, and epidemiological information. The expected result is Negative.  Fact Sheet for Patients:  EntrepreneurPulse.com.au  Fact Sheet for Healthcare Providers:  IncredibleEmployment.be  This test is no t yet approved or cleared by the [place] FDA and  has been authorized for detection and/or diagnosis of SARS-CoV-2 by FDA under an Emergency Use Authorization (EUA). This EUA will remain  in effect (meaning this test can be used) for the duration of the COVID-19 declaration under Section 564(b)(1) of the Act, 21 U.S.C.section 360bbb-3(b)(1), unless the authorization is terminated  or revoked sooner.       Influenza A by PCR NEGATIVE NEGATIVE Final   Influenza B by PCR NEGATIVE NEGATIVE Final    Comment: (NOTE) The Xpert Xpress SARS-CoV-2/FLU/RSV plus assay is intended as an aid in the diagnosis of influenza from Nasopharyngeal swab specimens and should not be  used as a sole basis for treatment. Nasal washings and aspirates are unacceptable for Xpert Xpress SARS-CoV-2/FLU/RSV testing.  Fact Sheet for Patients: EntrepreneurPulse.com.au  Fact Sheet for Healthcare Providers: IncredibleEmployment.be  This test is not yet approved or cleared by the [place] FDA and has been authorized for detection and/or diagnosis of SARS-CoV-2 by FDA under an Emergency Use Authorization (EUA). This EUA will remain in effect (meaning this test can be used) for the duration of the COVID-19 declaration under Section 564(b)(1) of the Act, 21 U.S.C. section 360bbb-3(b)(1), unless the authorization is terminated or revoked.  Performed at [facility], [address] [place], [place] [address]   Blood Culture (routine x 2)     Status: None (Preliminary result)   Collection Time: 01/24/21  6:08 PM   Specimen: BLOOD  Result Value Ref Range Status   Specimen Description BLOOD BLOOD LEFT WRIST  Final   Special Requests   Final    BOTTLES DRAWN AEROBIC AND ANAEROBIC Blood Culture adequate volume   Culture   Final    NO GROWTH < 24 HOURS Performed at [facility], [address], [place], [place] [address]    Report Status PENDING  Incomplete  Blood Culture (routine x 2)     Status: None (  Preliminary result)   Collection Time: 01/24/21  6:08 PM   Specimen: BLOOD  Result Value Ref Range Status   Specimen Description BLOOD BLOOD RIGHT FOREARM  Final   Special Requests   Final    BOTTLES DRAWN AEROBIC AND ANAEROBIC Blood Culture adequate volume   Culture   Final    NO GROWTH < 24 HOURS Performed at [facility], [address], [place], [place] [address]    Report Status PENDING  Incomplete  Urine Culture     Status: Abnormal   Collection Time: 01/24/21  6:08 PM   Specimen: Urine, Random  Result Value Ref Range Status   Specimen Description   Final    URINE, RANDOM Performed at [facility], [address], [place], [place] [address]    Special Requests   Final    NONE Performed at [facility], [address], [place], [place] [address]    Culture (A)  Final    <10,000 COLONIES/mL INSIGNIFICANT GROWTH Performed at [facility], [place] [address], [place], [place] [address]    Report Status 01/26/2021 FINAL  Final     Labs: BNP (last 3 results) No results for input(s): BNP in the last 8760 hours. Basic Metabolic Panel: Recent Labs  Lab 01/24/21 1808 01/25/21 0613 01/26/21 0810  NA 139 138 137  K 3.8 3.8 4.0  CL 106 104 101  CO2 '27 29 28  '$ GLUCOSE 112* 107* 91  BUN '19 13 14  '$ CREATININE 0.91 0.90 0.88  CALCIUM 8.8* 8.5* 8.8*   Liver Function Tests: Recent Labs  Lab 01/24/21 1808  AST 15  ALT 13  ALKPHOS 61  BILITOT 0.8  PROT 6.2*  ALBUMIN 3.7   No results for input(s): LIPASE, AMYLASE in the last 168 hours. No results for input(s): AMMONIA in the last 168 hours. CBC: Recent Labs  Lab 01/24/21 1808 01/25/21 0613 01/26/21 0810  WBC 13.8* 13.3* 8.8  NEUTROABS 11.8*  --  6.3  HGB 13.2 12.2* 12.7*  HCT 38.4* 34.7* 37.2*  MCV 92.3 92.5 92.8  PLT 198 170 192   Cardiac Enzymes: No results for input(s): CKTOTAL, CKMB, CKMBINDEX, TROPONINI in the last 168 hours. BNP: Invalid input(s): POCBNP CBG: No results for input(s): GLUCAP in the last 168 hours. D-Dimer No results for input(s): DDIMER in the last 72 hours. Hgb A1c No results for input(s): HGBA1C in the last 72 hours. Lipid Profile No results for input(s): CHOL, HDL, LDLCALC, TRIG, CHOLHDL, LDLDIRECT in the last 72 hours. Thyroid function studies Recent Labs    01/24/21 2202  TSH 2.705   Anemia work up Recent Labs    01/24/21 2202  VITAMINB12 744   Urinalysis    Component Value Date/Time   COLORURINE YELLOW (A) 01/24/2021 1808   APPEARANCEUR CLEAR (A) 01/24/2021 1808   APPEARANCEUR Clear 03/15/2014 0353   LABSPEC 1.018 01/24/2021 1808   LABSPEC  1.026 03/15/2014 0353   PHURINE 5.0 01/24/2021 1808   GLUCOSEU NEGATIVE 01/24/2021 1808   GLUCOSEU Negative 03/15/2014 0353   HGBUR NEGATIVE 01/24/2021 1808   BILIRUBINUR NEGATIVE 01/24/2021 1808   BILIRUBINUR Negative 03/15/2014 0353   KETONESUR NEGATIVE 01/24/2021 1808   PROTEINUR NEGATIVE 01/24/2021 1808   UROBILINOGEN 0.2 08/25/2009 0446   NITRITE NEGATIVE 01/24/2021 1808   LEUKOCYTESUR NEGATIVE 01/24/2021 1808   LEUKOCYTESUR Negative 03/15/2014 0353   Sepsis Labs Invalid input(s): PROCALCITONIN,  WBC,  LACTICIDVEN Microbiology Recent Results (from the past 240 hour(s))  Resp Panel by  RT-PCR (Flu A&B, Covid) Nasopharyngeal Swab     Status: None   Collection Time: 01/24/21  6:08 PM   Specimen: Nasopharyngeal Swab; Nasopharyngeal(NP) swabs in vial transport medium  Result Value Ref Range Status   SARS Coronavirus 2 by RT PCR NEGATIVE NEGATIVE Final    Comment: (NOTE) SARS-CoV-2 target nucleic acids are NOT DETECTED.  The SARS-CoV-2 RNA is generally detectable in upper respiratory specimens during the acute phase of infection. The lowest concentration of SARS-CoV-2 viral copies this assay can detect is 138 copies/mL. A negative result does not preclude SARS-Cov-2 infection and should not be used as the sole basis for treatment or other patient management decisions. A negative result may occur with  improper specimen collection/handling, submission of specimen other than nasopharyngeal swab, presence of viral mutation(s) within the areas targeted by this assay, and inadequate number of viral copies(<138 copies/mL). A negative result must be combined with clinical observations, patient history, and epidemiological information. The expected result is Negative.  Fact Sheet for Patients:  EntrepreneurPulse.com.au  Fact Sheet for Healthcare Providers:  IncredibleEmployment.be  This test is no t yet approved or cleared by the [place] FDA  and  has been authorized for detection and/or diagnosis of SARS-CoV-2 by FDA under an Emergency Use Authorization (EUA). This EUA will remain  in effect (meaning this test can be used) for the duration of the COVID-19 declaration under Section 564(b)(1) of the Act, 21 U.S.C.section 360bbb-3(b)(1), unless the authorization is terminated  or revoked sooner.       Influenza A by PCR NEGATIVE NEGATIVE Final   Influenza B by PCR NEGATIVE NEGATIVE Final    Comment: (NOTE) The Xpert Xpress SARS-CoV-2/FLU/RSV plus assay is intended as an aid in the diagnosis of influenza from Nasopharyngeal swab specimens and should not be used as a sole basis for treatment. Nasal washings and aspirates are unacceptable for Xpert Xpress SARS-CoV-2/FLU/RSV testing.  Fact Sheet for Patients: EntrepreneurPulse.com.au  Fact Sheet for Healthcare Providers: IncredibleEmployment.be  This test is not yet approved or cleared by the [place] FDA and has been authorized for detection and/or diagnosis of SARS-CoV-2 by FDA under an Emergency Use Authorization (EUA). This EUA will remain in effect (meaning this test can be used) for the duration of the COVID-19 declaration under Section 564(b)(1) of the Act, 21 U.S.C. section 360bbb-3(b)(1), unless the authorization is terminated or revoked.  Performed at [facility], [address] [place], [place] [address]   Blood Culture (routine x 2)     Status: None (Preliminary result)   Collection Time: 01/24/21  6:08 PM   Specimen: BLOOD  Result Value Ref Range Status   Specimen Description BLOOD BLOOD LEFT WRIST  Final   Special Requests   Final    BOTTLES DRAWN AEROBIC AND ANAEROBIC Blood Culture adequate volume   Culture   Final    NO GROWTH < 24 HOURS Performed at [facility], [address], [place], [place] [address]    Report Status PENDING  Incomplete  Blood Culture (routine x 2)     Status:  None (Preliminary result)   Collection Time: 01/24/21  6:08 PM   Specimen: BLOOD  Result Value Ref Range Status   Specimen Description BLOOD BLOOD RIGHT FOREARM  Final   Special Requests   Final    BOTTLES DRAWN AEROBIC AND ANAEROBIC Blood Culture adequate volume   Culture   Final    NO GROWTH < 24 HOURS Performed at [facility], [address] [place], [place] [address]    Report Status PENDING  Incomplete  Urine Culture     Status: Abnormal   Collection Time: 01/24/21  6:08 PM   Specimen: Urine, Random  Result Value Ref Range Status   Specimen Description   Final    URINE, RANDOM Performed at [facility], [address], [place], [place] [address]    Special Requests   Final    NONE Performed at [facility], [address] [place], [place] [address]    Culture (A)  Final    <10,000 COLONIES/mL INSIGNIFICANT GROWTH Performed at [facility], [place] [address], [place], [place] [address]    Report Status 01/26/2021 FINAL  Final     Time coordinating discharge: Over 30 minutes  SIGNED:   Sidney Ace, MD  [facility] 01/26/2021, 1:28 PM Pager   If 7PM-7AM, please contact night-coverage

## 2021-01-26 NOTE — Progress Notes (Signed)
Patient discharged home with via personal vehicle. IV removed. VSS. All belongings sent with patient. Discharge went over with wife and patient with opportunity to asked questions.

## 2021-01-29 LAB — CULTURE, BLOOD (ROUTINE X 2)
Culture: NO GROWTH
Culture: NO GROWTH
Special Requests: ADEQUATE
Special Requests: ADEQUATE

## 2021-03-19 ENCOUNTER — Ambulatory Visit (INDEPENDENT_AMBULATORY_CARE_PROVIDER_SITE_OTHER): Payer: Medicare Other | Admitting: Vascular Surgery

## 2021-03-19 ENCOUNTER — Encounter (INDEPENDENT_AMBULATORY_CARE_PROVIDER_SITE_OTHER): Payer: Medicare Other

## 2021-03-25 ENCOUNTER — Other Ambulatory Visit (INDEPENDENT_AMBULATORY_CARE_PROVIDER_SITE_OTHER): Payer: Self-pay | Admitting: Nurse Practitioner

## 2021-03-25 DIAGNOSIS — I6523 Occlusion and stenosis of bilateral carotid arteries: Secondary | ICD-10-CM

## 2021-03-27 ENCOUNTER — Ambulatory Visit (INDEPENDENT_AMBULATORY_CARE_PROVIDER_SITE_OTHER): Payer: Medicare Other | Admitting: Nurse Practitioner

## 2021-03-27 ENCOUNTER — Ambulatory Visit (INDEPENDENT_AMBULATORY_CARE_PROVIDER_SITE_OTHER): Payer: Medicare Other

## 2021-03-27 ENCOUNTER — Other Ambulatory Visit: Payer: Self-pay

## 2021-03-27 VITALS — BP 122/72 | HR 71 | Ht 69.0 in | Wt 156.0 lb

## 2021-03-27 DIAGNOSIS — E7800 Pure hypercholesterolemia, unspecified: Secondary | ICD-10-CM | POA: Diagnosis not present

## 2021-03-27 DIAGNOSIS — I6523 Occlusion and stenosis of bilateral carotid arteries: Secondary | ICD-10-CM

## 2021-03-27 DIAGNOSIS — I10 Essential (primary) hypertension: Secondary | ICD-10-CM

## 2021-04-07 ENCOUNTER — Encounter (INDEPENDENT_AMBULATORY_CARE_PROVIDER_SITE_OTHER): Payer: Self-pay | Admitting: Nurse Practitioner

## 2021-04-07 NOTE — Progress Notes (Signed)
Subjective:    Patient ID: Troy Fridge., male    DOB: [birth year], 80 y.o.   MRN: [id number] Chief Complaint  Patient presents with   Follow-up    1 yr Carotid    Troy Pugh is a 80 year old male that is seen for follow up evaluation of carotid stenosis. The carotid stenosis followed by ultrasound.   The patient denies amaurosis fugax. There is no recent history of TIA symptoms or focal motor deficits. There is no prior documented CVA.  The patient is taking enteric-coated aspirin 81 mg daily.  There is no history of migraine headaches. There is no history of seizures.  The patient has a history of coronary artery disease, no recent episodes of angina or shortness of breath. The patient denies PAD or claudication symptoms. There is a history of hyperlipidemia which is being treated with a statin.    Duplex ultrasound shows 1 to 39% stenosis in the left ICA with 40 to 59% stenosis of the right   No change compared to last study in 03/21/2020   Review of Systems  Eyes:  Negative for visual disturbance.  All other systems reviewed and are negative.     Objective:   Physical Exam Vitals reviewed.  HENT:     Head: Normocephalic.  Neck:     Vascular: No carotid bruit.  Cardiovascular:     Rate and Rhythm: Normal rate.     Pulses: Normal pulses.  Pulmonary:     Effort: Pulmonary effort is normal.  Neurological:     Mental Status: He is alert and oriented to person, place, and time.  Psychiatric:        Mood and Affect: Mood normal.        Behavior: Behavior normal.        Thought Content: Thought content normal.        Judgment: Judgment normal.    BP 122/72 Comment: Left  Pulse 71   Ht 5\' 9"  (1.753 m)   Wt 156 lb (70.8 kg)   BMI 23.04 kg/m   Past Medical History:  Diagnosis Date   Carotid stent occlusion (HCC)    High cholesterol    Hypertension    Stroke ([place])    Vascular dementia ([place])     Social History   Socioeconomic History   Marital  status: Married    Spouse name: chris   Number of children: 0   Years of education: college   Highest education level: Not on file  Occupational History   Not on file  Tobacco Use   Smoking status: Never   Smokeless tobacco: Never  Substance and Sexual Activity   Alcohol use: Yes    Alcohol/week: 3.0 standard drinks    Types: 1 Glasses of wine, 1 Cans of beer, 1 Shots of liquor per week    Comment: occasionally   Drug use: No   Sexual activity: Not on file  Other Topics Concern   Not on file  Social History Narrative   Patient is married, no children.   Patient is-- handed.   Patient has college education.   Patient drinks -cups daily.   Social Determinants of Health   Financial Resource Strain: Not on file  Food Insecurity: Not on file  Transportation Needs: Not on file  Physical Activity: Not on file  Stress: Not on file  Social Connections: Not on file  Intimate Partner Violence: Not on file    Past Surgical History:  Procedure Laterality Date  CATARACT EXTRACTION     GALLBLADDER SURGERY      Family History  Problem Relation Age of Onset   Kidney failure Mother    Stroke Father     Allergies  Allergen Reactions   Other Hives   Sulfa Antibiotics Hives   Sulphadimidine [Sulfamethazine]     CBC Latest Ref Rng & Units 01/26/2021 01/25/2021 01/24/2021  WBC 4.0 - 10.5 K/uL 8.8 13.3(H) 13.8(H)  Hemoglobin 13.0 - 17.0 g/dL 12.7(L) 12.2(L) 13.2  Hematocrit 39.0 - 52.0 % 37.2(L) 34.7(L) 38.4(L)  Platelets 150 - 400 K/uL 192 170 198      CMP     Component Value Date/Time   NA 137 01/26/2021 0810   NA 136 03/17/2014 0406   K 4.0 01/26/2021 0810   K 3.2 (L) 03/17/2014 0406   CL 101 01/26/2021 0810   CL 102 03/17/2014 0406   CO2 28 01/26/2021 0810   CO2 24 03/17/2014 0406   GLUCOSE 91 01/26/2021 0810   GLUCOSE 90 03/17/2014 0406   BUN 14 01/26/2021 0810   BUN 12 03/17/2014 0406   CREATININE 0.88 01/26/2021 0810   CREATININE 1.19 03/17/2014 0406    CALCIUM 8.8 (L) 01/26/2021 0810   CALCIUM 7.9 (L) 03/17/2014 0406   PROT 6.2 (L) 01/24/2021 1808   PROT 5.4 (L) 03/17/2014 0406   ALBUMIN 3.7 01/24/2021 1808   ALBUMIN 2.1 (L) 03/17/2014 0406   AST 15 01/24/2021 1808   AST 37 03/17/2014 0406   ALT 13 01/24/2021 1808   ALT 24 03/17/2014 0406   ALKPHOS 61 01/24/2021 1808   ALKPHOS 34 (L) 03/17/2014 0406   BILITOT 0.8 01/24/2021 1808   BILITOT 0.3 03/17/2014 0406   GFRNONAA >60 01/26/2021 0810   GFRNONAA >60 03/17/2014 0406   GFRAA 54 (L) 10/01/2016 2132   GFRAA >60 03/17/2014 0406     No results found.     Assessment & Plan:   1. Bilateral carotid artery stenosis Recommend:  Given the patient's asymptomatic subcritical stenosis no further invasive testing or surgery at this time.  Duplex ultrasound shows 1 to 39% stenosis in the left ICA with 40 to 59% stenosis of the right  Continue antiplatelet therapy as prescribed Continue management of CAD, HTN and Hyperlipidemia Healthy heart diet,  encouraged exercise at least 4 times per week Follow up in 12 months with duplex ultrasound and physical exam    2. Benign essential hypertension Continue antihypertensive medications as already ordered, these medications have been reviewed and there are no changes at this time.   3. High cholesterol Continue statin as ordered and reviewed, no changes at this time    Current Outpatient Medications on File Prior to Visit  Medication Sig Dispense Refill   allopurinol (ZYLOPRIM) 100 MG tablet Take 100 mg by mouth at bedtime.     atorvastatin (LIPITOR) 10 MG tablet Take 10 mg by mouth daily.     Calcium Carb-Cholecalciferol 600-500 MG-UNIT CAPS Take 1 tablet by mouth daily.      Cholecalciferol (VITAMIN D) 2000 UNITS tablet Take 2,000 Units by mouth daily.     clopidogrel (PLAVIX) 75 MG tablet Take 75 mg by mouth daily.      colchicine 0.6 MG tablet Take 0.6-1.2 mg by mouth daily as needed (acute gout flare).     fexofenadine (ALLEGRA)  180 MG tablet Take 180 mg by mouth daily as needed for allergies.     fluticasone (FLONASE) 50 MCG/ACT nasal spray Place 2 sprays into both nostrils daily  as needed for allergies.     folic acid (FOLVITE) 098 MCG tablet Take 400 mcg by mouth daily.     lamoTRIgine (LAMICTAL) 25 MG tablet Take 50 mg by mouth 2 (two) times daily.     pantoprazole (PROTONIX) 20 MG tablet Take 20 mg by mouth daily.     sertraline (ZOLOFT) 50 MG tablet Take 50 mg by mouth daily.     tamsulosin (FLOMAX) 0.4 MG CAPS capsule Take 0.4 mg by mouth daily.     No current facility-administered medications on file prior to visit.    There are no Patient Instructions on file for this visit. No follow-ups on file.   Kris Hartmann, NP

## 2021-04-08 ENCOUNTER — Other Ambulatory Visit: Payer: Self-pay | Admitting: Neurology

## 2021-04-08 DIAGNOSIS — I69319 Unspecified symptoms and signs involving cognitive functions following cerebral infarction: Secondary | ICD-10-CM

## 2021-04-26 ENCOUNTER — Other Ambulatory Visit: Payer: Self-pay

## 2021-04-26 ENCOUNTER — Ambulatory Visit
Admission: RE | Admit: 2021-04-26 | Discharge: 2021-04-26 | Disposition: A | Discharge status: Home / Self Care | Payer: Medicare Other | Source: Ambulatory Visit | Attending: Neurology | Admitting: Neurology

## 2021-04-26 DIAGNOSIS — I69319 Unspecified symptoms and signs involving cognitive functions following cerebral infarction: Secondary | ICD-10-CM | POA: Insufficient documentation

## 2022-03-25 ENCOUNTER — Encounter (INDEPENDENT_AMBULATORY_CARE_PROVIDER_SITE_OTHER): Payer: Medicare Other

## 2022-03-25 ENCOUNTER — Ambulatory Visit (INDEPENDENT_AMBULATORY_CARE_PROVIDER_SITE_OTHER): Payer: Medicare Other | Admitting: Vascular Surgery

## 2022-04-01 ENCOUNTER — Ambulatory Visit (INDEPENDENT_AMBULATORY_CARE_PROVIDER_SITE_OTHER): Payer: Medicare Other | Admitting: Vascular Surgery

## 2022-04-01 ENCOUNTER — Encounter (INDEPENDENT_AMBULATORY_CARE_PROVIDER_SITE_OTHER): Payer: Medicare Other

## 2022-04-08 ENCOUNTER — Encounter (INDEPENDENT_AMBULATORY_CARE_PROVIDER_SITE_OTHER): Payer: Medicare Other

## 2022-04-08 ENCOUNTER — Ambulatory Visit (INDEPENDENT_AMBULATORY_CARE_PROVIDER_SITE_OTHER): Payer: Medicare Other | Admitting: Vascular Surgery

## 2022-04-11 ENCOUNTER — Ambulatory Visit (INDEPENDENT_AMBULATORY_CARE_PROVIDER_SITE_OTHER): Payer: Medicare Other | Admitting: Nurse Practitioner

## 2022-04-11 ENCOUNTER — Encounter (INDEPENDENT_AMBULATORY_CARE_PROVIDER_SITE_OTHER): Payer: Medicare Other

## 2022-04-22 ENCOUNTER — Other Ambulatory Visit (INDEPENDENT_AMBULATORY_CARE_PROVIDER_SITE_OTHER): Payer: Self-pay | Admitting: Nurse Practitioner

## 2022-04-22 DIAGNOSIS — I6523 Occlusion and stenosis of bilateral carotid arteries: Secondary | ICD-10-CM

## 2022-04-23 ENCOUNTER — Ambulatory Visit (INDEPENDENT_AMBULATORY_CARE_PROVIDER_SITE_OTHER): Payer: Medicare Other | Admitting: Nurse Practitioner

## 2022-04-23 ENCOUNTER — Encounter (INDEPENDENT_AMBULATORY_CARE_PROVIDER_SITE_OTHER): Payer: Self-pay | Admitting: Nurse Practitioner

## 2022-04-23 ENCOUNTER — Ambulatory Visit (INDEPENDENT_AMBULATORY_CARE_PROVIDER_SITE_OTHER): Payer: Medicare Other

## 2022-04-23 VITALS — BP 120/70 | HR 73 | Wt 155.2 lb

## 2022-04-23 DIAGNOSIS — I10 Essential (primary) hypertension: Secondary | ICD-10-CM

## 2022-04-23 DIAGNOSIS — I6523 Occlusion and stenosis of bilateral carotid arteries: Secondary | ICD-10-CM | POA: Diagnosis not present

## 2022-04-23 DIAGNOSIS — E7800 Pure hypercholesterolemia, unspecified: Secondary | ICD-10-CM | POA: Diagnosis not present

## 2022-04-23 NOTE — Progress Notes (Signed)
Subjective:    Patient ID: Troy Pugh., male    DOB: 1941-05-10, 81 y.o.   MRN: [id number] Chief Complaint  Patient presents with   Follow-up    Ultrasound follow up    [name] is seen today in clinic for his annual follow up for carotid stenosis which is evaluated by Ultrasound. The patient denies any signs or symptoms related to stroke or TIA. He also denies an amaurosis fugax. He denies any vision changes at this visit. He is taking Plavix 75 mg once a day. Patient denies any headaches, nausea or vomiting. There is no noted history of seizures. The patient denies any symptoms related to PAD or claudication. The patient does have a history of CAD but denies any recent chest pain or shortness of breath. Patient does have a history of hyperlipidemia and is being treated with atorvastatin 10 mg daily.   The patients duplex Carotid Ultrasound shows 1 to 39% stenosis in the left ICA with 40 to 59% stenosis of the right ICA.     Review of Systems  Constitutional: Negative.   Eyes: Negative.   Respiratory: Negative.    Cardiovascular: Negative.   Gastrointestinal: Negative.   Genitourinary: Negative.   Skin: Negative.   Neurological: Negative.   Psychiatric/Behavioral: Negative.    All other systems reviewed and are negative.      Objective:   Physical Exam Vitals reviewed.  Constitutional:      Appearance: Normal appearance. He is normal weight.  HENT:     Head: Normocephalic.  Eyes:     Extraocular Movements: Extraocular movements intact.     Pupils: Pupils are equal, round, and reactive to light.  Cardiovascular:     Rate and Rhythm: Normal rate and regular rhythm.     Pulses: Normal pulses.  Pulmonary:     Effort: Pulmonary effort is normal.  Abdominal:     General: Abdomen is flat. Bowel sounds are normal.     Palpations: Abdomen is soft.  Musculoskeletal:        General: Normal range of motion.     Cervical back: Normal range of motion.  Skin:     General: Skin is warm and dry.     Capillary Refill: Capillary refill takes less than 2 seconds.  Neurological:     General: No focal deficit present.     Mental Status: He is alert and oriented to person, place, and time.  Psychiatric:        Mood and Affect: Mood normal.        Behavior: Behavior normal.        Thought Content: Thought content normal.        Judgment: Judgment normal.     BP 120/70 (BP Location: Right Arm)   Pulse 73   Wt 155 lb 3.2 oz (70.4 kg)   SpO2 (!) 16%   BMI 22.92 kg/m   Past Medical History:  Diagnosis Date   Carotid stent occlusion (HCC)    High cholesterol    Hypertension    Stroke ([place])    Vascular dementia ([place])     Social History   Socioeconomic History   Marital status: Married    Spouse name: chris   Number of children: 0   Years of education: college   Highest education level: Not on file  Occupational History   Not on file  Tobacco Use   Smoking status: Never   Smokeless tobacco: Never  Substance and Sexual Activity  Alcohol use: Yes    Alcohol/week: 3.0 standard drinks of alcohol    Types: 1 Glasses of wine, 1 Cans of beer, 1 Shots of liquor per week    Comment: occasionally   Drug use: No   Sexual activity: Not on file  Other Topics Concern   Not on file  Social History Narrative   Patient is married, no children.   Patient is-- handed.   Patient has college education.   Patient drinks -cups daily.   Social Determinants of Health   Financial Resource Strain: Not on file  Food Insecurity: Not on file  Transportation Needs: Not on file  Physical Activity: Not on file  Stress: Not on file  Social Connections: Not on file  Intimate Partner Violence: Not on file    Past Surgical History:  Procedure Laterality Date   CATARACT EXTRACTION     GALLBLADDER SURGERY      Family History  Problem Relation Age of Onset   Kidney failure Mother    Stroke Father     Allergies  Allergen Reactions   Other Hives    Sulfa Antibiotics Hives   Sulphadimidine [Sulfamethazine]        Latest Ref Rng & Units 01/26/2021    8:10 AM 01/25/2021    6:13 AM 01/24/2021    6:08 PM  CBC  WBC 4.0 - 10.5 K/uL 8.8  13.3  13.8   Hemoglobin 13.0 - 17.0 g/dL 12.7  12.2  13.2   Hematocrit 39.0 - 52.0 % 37.2  34.7  38.4   Platelets 150 - 400 K/uL 192  170  198       CMP     Component Value Date/Time   NA 137 01/26/2021 0810   NA 136 03/17/2014 0406   K 4.0 01/26/2021 0810   K 3.2 (L) 03/17/2014 0406   CL 101 01/26/2021 0810   CL 102 03/17/2014 0406   CO2 28 01/26/2021 0810   CO2 24 03/17/2014 0406   GLUCOSE 91 01/26/2021 0810   GLUCOSE 90 03/17/2014 0406   BUN 14 01/26/2021 0810   BUN 12 03/17/2014 0406   CREATININE 0.88 01/26/2021 0810   CREATININE 1.19 03/17/2014 0406   CALCIUM 8.8 (L) 01/26/2021 0810   CALCIUM 7.9 (L) 03/17/2014 0406   PROT 6.2 (L) 01/24/2021 1808   PROT 5.4 (L) 03/17/2014 0406   ALBUMIN 3.7 01/24/2021 1808   ALBUMIN 2.1 (L) 03/17/2014 0406   AST 15 01/24/2021 1808   AST 37 03/17/2014 0406   ALT 13 01/24/2021 1808   ALT 24 03/17/2014 0406   ALKPHOS 61 01/24/2021 1808   ALKPHOS 34 (L) 03/17/2014 0406   BILITOT 0.8 01/24/2021 1808   BILITOT 0.3 03/17/2014 0406   GFRNONAA >60 01/26/2021 0810   GFRNONAA >60 03/17/2014 0406   GFRAA 54 (L) 10/01/2016 2132   GFRAA >60 03/17/2014 0406     No results found.     Assessment & Plan:   1. Bilateral carotid artery stenosis Recommend:  Given the patient's asymptomatic subcritical stenosis no further invasive testing or surgery at this time.   The patients duplex Carotid Ultrasound shows 1 to 39% stenosis in the left ICA with 40 to 59% stenosis of the right ICA.    Continue antiplatelet therapy as prescribed Continue management of CAD, HTN and Hyperlipidemia Healthy heart diet,  encouraged exercise at least 4 times per week  Follow up in 1 Year with duplex ultrasound of  bilateral carotids and physical exam  2. High  cholesterol Continue statin as ordered and reviewed, no changes at this time   3. Primary hypertension Continue cardiac and antihypertensive medications as already ordered and reviewed, no changes at this time.  Continue statin as ordered and reviewed, no changes at this time  Nitrates PRN for chest pain    Current Outpatient Medications on File Prior to Visit  Medication Sig Dispense Refill   allopurinol (ZYLOPRIM) 100 MG tablet Take 100 mg by mouth at bedtime.     atorvastatin (LIPITOR) 10 MG tablet Take 10 mg by mouth daily.     Calcium Carb-Cholecalciferol 600-500 MG-UNIT CAPS Take 1 tablet by mouth daily.      Cholecalciferol (VITAMIN D) 2000 UNITS tablet Take 2,000 Units by mouth daily.     clopidogrel (PLAVIX) 75 MG tablet Take 75 mg by mouth daily.      colchicine 0.6 MG tablet Take 0.6-1.2 mg by mouth daily as needed (acute gout flare).     fexofenadine (ALLEGRA) 180 MG tablet Take 180 mg by mouth daily as needed for allergies.     fluticasone (FLONASE) 50 MCG/ACT nasal spray Place 2 sprays into both nostrils daily as needed for allergies.     folic acid (FOLVITE) 861 MCG tablet Take 400 mcg by mouth daily.     lamoTRIgine (LAMICTAL) 25 MG tablet Take 50 mg by mouth 2 (two) times daily.     pantoprazole (PROTONIX) 20 MG tablet Take 20 mg by mouth daily.     sertraline (ZOLOFT) 50 MG tablet Take 50 mg by mouth daily.     tamsulosin (FLOMAX) 0.4 MG CAPS capsule Take 0.4 mg by mouth daily.     No current facility-administered medications on file prior to visit.    There are no Patient Instructions on file for this visit. No follow-ups on file.   Kris Hartmann, NP

## 2022-04-28 ENCOUNTER — Encounter (INDEPENDENT_AMBULATORY_CARE_PROVIDER_SITE_OTHER): Payer: Self-pay

## 2022-07-12 ENCOUNTER — Emergency Department
Admission: EM | Admit: 2022-07-12 | Discharge: 2022-07-13 | Disposition: A | Discharge status: Home / Self Care | Payer: Medicare Other | Attending: Emergency Medicine | Admitting: Emergency Medicine

## 2022-07-12 ENCOUNTER — Other Ambulatory Visit: Payer: Self-pay

## 2022-07-12 DIAGNOSIS — N3001 Acute cystitis with hematuria: Secondary | ICD-10-CM | POA: Insufficient documentation

## 2022-07-12 DIAGNOSIS — R339 Retention of urine, unspecified: Secondary | ICD-10-CM | POA: Insufficient documentation

## 2022-07-12 LAB — COMPREHENSIVE METABOLIC PANEL
ALT: 16 U/L (ref 0–44)
AST: 21 U/L (ref 15–41)
Albumin: 4 g/dL (ref 3.5–5.0)
Alkaline Phosphatase: 50 U/L (ref 38–126)
Anion gap: 8 (ref 5–15)
BUN: 23 mg/dL (ref 8–23)
CO2: 25 mmol/L (ref 22–32)
Calcium: 8.8 mg/dL — ABNORMAL LOW (ref 8.9–10.3)
Chloride: 102 mmol/L (ref 98–111)
Creatinine, Ser: 1.13 mg/dL (ref 0.61–1.24)
GFR, Estimated: >60 mL/min (ref >60)
Glucose, Bld: 106 mg/dL — ABNORMAL HIGH (ref 70–99)
Potassium: 4.1 mmol/L (ref 3.5–5.1)
Sodium: 135 mmol/L (ref 135–145)
Total Bilirubin: 0.7 mg/dL (ref 0.3–1.2)
Total Protein: 6.8 g/dL (ref 6.5–8.1)

## 2022-07-12 LAB — URINALYSIS, ROUTINE W REFLEX MICROSCOPIC
Bilirubin Urine: NEGATIVE
Glucose, UA: NEGATIVE mg/dL
Ketones, ur: NEGATIVE mg/dL
Nitrite: NEGATIVE
Protein, ur: 30 mg/dL — AB
Specific Gravity, Urine: 1.02 (ref 1.005–1.030)
Squamous Epithelial / HPF: NONE SEEN /HPF (ref 0–5)
WBC, UA: >50 WBC/hpf — ABNORMAL HIGH (ref 0–5)
pH: 7 (ref 5.0–8.0)

## 2022-07-12 LAB — CBC WITH DIFFERENTIAL/PLATELET
Abs Immature Granulocytes: 0.02 10*3/uL (ref 0.00–0.07)
Basophils Absolute: 0 10*3/uL (ref 0.0–0.1)
Basophils Relative: 1 %
Eosinophils Absolute: 0.1 10*3/uL (ref 0.0–0.5)
Eosinophils Relative: 1 %
HCT: 38.2 % — ABNORMAL LOW (ref 39.0–52.0)
Hemoglobin: 12.6 g/dL — ABNORMAL LOW (ref 13.0–17.0)
Immature Granulocytes: 0 %
Lymphocytes Relative: 7 %
Lymphs Abs: 0.5 10*3/uL — ABNORMAL LOW (ref 0.7–4.0)
MCH: 30.5 pg (ref 26.0–34.0)
MCHC: 33 g/dL (ref 30.0–36.0)
MCV: 92.5 fL (ref 80.0–100.0)
Monocytes Absolute: 1.2 10*3/uL — ABNORMAL HIGH (ref 0.1–1.0)
Monocytes Relative: 19 %
Neutro Abs: 4.6 10*3/uL (ref 1.7–7.7)
Neutrophils Relative %: 72 %
Platelets: 191 10*3/uL (ref 150–400)
RBC: 4.13 MIL/uL — ABNORMAL LOW (ref 4.22–5.81)
RDW: 13.1 % (ref 11.5–15.5)
WBC: 6.3 10*3/uL (ref 4.0–10.5)
nRBC: 0 % (ref 0.0–0.2)

## 2022-07-12 LAB — LIPASE, BLOOD: Lipase: 34 U/L (ref 11–51)

## 2022-07-12 MED ORDER — CIPROFLOXACIN HCL 500 MG PO TABS
500.0000 mg | ORAL_TABLET | Freq: Once | ORAL | Status: AC
  Administered 2022-07-12: 500 mg via ORAL
  Filled 2022-07-12: qty 1

## 2022-07-12 MED ORDER — CIPROFLOXACIN HCL 500 MG PO TABS: 500.0000 mg | ORAL_TABLET | Freq: Two times a day (BID) | ORAL | 0 refills | Status: DC

## 2022-07-12 MED ORDER — CIPROFLOXACIN HCL 500 MG PO TABS: 500.0000 mg | ORAL_TABLET | Freq: Two times a day (BID) | ORAL | 0 refills | Status: AC

## 2022-07-12 NOTE — Discharge Instructions (Signed)
Take Cipro twice daily for ten days.

## 2022-07-12 NOTE — ED Triage Notes (Signed)
Pt to ED from home via EMS. Pt wife advised pt was standing in front of toilet for an hour and unable to go.   Previous HX of stroke so speech is altered at baseline.  Pt unable to use restroom and unable to answer questions for EMS.  106/74 83HR  96% on RA 22 RR

## 2022-07-13 NOTE — ED Notes (Signed)
Catheter instructions provided. Patient and wife verbalizes understanding of discharge instructions. Opportunity for questioning and answers were provided. Armband removed by staff, pt discharged from ED. Wheeled out to lobby

## 2022-07-13 NOTE — ED Provider Notes (Signed)
[facility] Provider Note  Patient Contact: 12:00 AM (approximate)   History   Urinary Retention   HPI  Troy Pugh. is a 82 y.o. male presents to the emergency department with urinary retention that started today.  Patient has a history of dementia and patient's wife reports that patient was standing in front of the toilet for prolonged amount of time and was only to produce a small amount of urine.  No fever or chills perceived at home.  No nausea, vomiting or reports of low back pain.      Physical Exam   Triage Vital Signs: ED Triage Vitals  Enc Vitals Group     BP 07/12/22 2032 97/62     Pulse Rate 07/12/22 2032 88     Resp 07/12/22 2032 16     Temp 07/12/22 2032 98.2 F (36.8 C)     Temp Source 07/12/22 2032 Oral     SpO2 07/12/22 2032 95 %     Weight 07/12/22 2033 148 lb (67.1 kg)     Height 07/12/22 2033 '5\' 8"'$  (1.727 m)     Head Circumference --      Peak Flow --      Pain Score 07/12/22 2033 0     Pain Loc --      Pain Edu? --      Excl. in [place]? --     Most recent vital signs: Vitals:   07/12/22 2032  BP: 97/62  Pulse: 88  Resp: 16  Temp: 98.2 F (36.8 C)  SpO2: 95%     General: Alert and in no acute distress. Eyes:  PERRL. EOMI. Head: No acute traumatic findings ENT:      Nose: No congestion/rhinnorhea.      Mouth/Throat: Mucous membranes are moist. Neck: No stridor. No cervical spine tenderness to palpation. Cardiovascular:  Good peripheral perfusion Respiratory: Normal respiratory effort without tachypnea or retractions. Lungs CTAB. Good air entry to the bases with no decreased or absent breath sounds. Gastrointestinal: Bowel sounds 4 quadrants. Soft and nontender to palpation. No guarding or rigidity. No palpable masses. No distention. No CVA tenderness.* Musculoskeletal: Full range of motion to all extremities.  Neurologic:  No gross focal neurologic deficits are appreciated.  Skin:   No rash noted    ED  Results / Procedures / Treatments   Labs (all labs ordered are listed, but only abnormal results are displayed) Labs Reviewed  URINALYSIS, ROUTINE W REFLEX MICROSCOPIC - Abnormal; Notable for the following components:      Result Value   Color, Urine AMBER (*)    APPearance CLOUDY (*)    Hgb urine dipstick SMALL (*)    Protein, ur 30 (*)    Leukocytes,Ua LARGE (*)    WBC, UA >50 (*)    Bacteria, UA MANY (*)    All other components within normal limits  CBC WITH DIFFERENTIAL/PLATELET - Abnormal; Notable for the following components:   RBC 4.13 (*)    Hemoglobin 12.6 (*)    HCT 38.2 (*)    Lymphs Abs 0.5 (*)    Monocytes Absolute 1.2 (*)    All other components within normal limits  COMPREHENSIVE METABOLIC PANEL - Abnormal; Notable for the following components:   Glucose, Bld 106 (*)    Calcium 8.8 (*)    All other components within normal limits  LIPASE, BLOOD     PROCEDURES:  Critical Care performed: No  Procedures   MEDICATIONS ORDERED IN  ED: Medications  ciprofloxacin (CIPRO) tablet 500 mg (500 mg Oral Given 07/12/22 2343)     IMPRESSION / MDM / ASSESSMENT AND PLAN / ED COURSE  I reviewed the triage vital signs and the nursing notes.                              Assessment and plan UTI 82 year old male presents to the emergency department with new onset urinary retention that started today.  Vital signs are reassuring at triage.  On exam, patient was alert and nontoxic-appearing.  CBC and CMP consistent with patient's baseline.  Urinalysis concerning for UTI.  Patient was started on ciprofloxacin and given his first dose in the emergency department.  Return precautions were given to return to the emergency department with new or worsening symptoms.  Foley catheter was placed and urology follow-up was recommended.  Final diagnoses:  Urinary retention  Acute cystitis with hematuria     Rx / DC Orders   ED Discharge Orders          Ordered     ciprofloxacin (CIPRO) 500 MG tablet  2 times daily,   Status:  Discontinued        07/12/22 2337    ciprofloxacin (CIPRO) 500 MG tablet  2 times daily        07/12/22 2346             Note:  This document was prepared using Dragon voice recognition software and may include unintentional dictation errors.   Vallarie Mare [place], PA-C 07/13/22 0002    Harvest Dark, MD 07/13/22 2218

## 2022-07-22 ENCOUNTER — Ambulatory Visit: Payer: Self-pay | Admitting: Physician Assistant

## 2022-07-22 ENCOUNTER — Encounter: Payer: Self-pay | Admitting: Urology

## 2022-07-22 ENCOUNTER — Ambulatory Visit (INDEPENDENT_AMBULATORY_CARE_PROVIDER_SITE_OTHER): Payer: Medicare Other | Admitting: Urology

## 2022-07-22 VITALS — BP 97/62 | HR 70 | Ht 69.0 in | Wt 152.0 lb

## 2022-07-22 DIAGNOSIS — N3000 Acute cystitis without hematuria: Secondary | ICD-10-CM | POA: Diagnosis not present

## 2022-07-22 DIAGNOSIS — Z8546 Personal history of malignant neoplasm of prostate: Secondary | ICD-10-CM

## 2022-07-22 DIAGNOSIS — R339 Retention of urine, unspecified: Secondary | ICD-10-CM

## 2022-07-22 NOTE — Progress Notes (Signed)
Fill and Pull Catheter Removal  Patient is present today for a catheter removal.  Patient was cleaned and prepped in a sterile fashion 250 ml of sterile water/ saline was instilled into the bladder when the patient felt the urge to urinate. 8 ml of water was then drained from the balloon.  A 16FR foley cath was removed from the bladder no complications were noted .  Patient as then given some time to void on their own.  Patient can void 50 ml on their own after some time.  Patient tolerated well.  PVR 27 ml  Performed by: Farzad Tibbetts, RMA  Follow up/ Additional notes: come back this afternoon if not able to urinate

## 2022-07-22 NOTE — Progress Notes (Signed)
I, Troy Pugh,acting as a scribe for Troy Espy, MD.,have documented all relevant documentation on the behalf of Troy Espy, MD,as directed by  Troy Espy, MD while in the presence of Troy Espy, MD.   07/22/22 11:54 AM   Troy Pugh 31-Jan-1941 [id number]  Referring provider: Idelle Crouch, MD [facility] - [place],  [place] [address]  Chief Complaint  Patient presents with   Establish Care   Urinary Retention    HPI: 82 year-old male who returns today for a further evaluation of urinary retention.   He was seen and evaluated in the emergency room on 07-12-22 with one day of difficulty urinating per the patient's wife. Notably, the patient does have dementia. His urinalysis at the time was concerning for urinary tract infection, frankly positive. He was started on Cipro. A foley catheter was placed with unknown urinary volume return.  His wife states that there was not much in his bladder.  His most recent PSA was 0.01 on 05/21/22.   He has a personal history of prostate cancer. He is s/p brachy seed implantation in 2012.  He is to be followed by Dr. Jacqlyn Larsen.  He has been on Flomax since this time.  Prior to this episode he has not had any issues with urinary retention. He has had one other UTI 5-6 years ago.  He does feel like he is able to empty on a regular basis and not had issues with weak stream, straining, inadequate emptying, etc.  No dysuria or gross hematuria.  He has one more day remaining with the antibiotics course.   No recent cross-sectional imaging.  He did have a CT scan from 12/2020 which showed brachytherapy seeds in the prostate as well as mild diffuse bladder wall thickening with perivesical stranding.  At that time, he was admitted for was felt to be sepsis related to colitis.  He had Staph aureus in his urine.  PMH: Past Medical History:  Diagnosis Date   Carotid stent occlusion (HCC)    High cholesterol     Hypertension    Stroke [facility])    Vascular dementia [facility])     Surgical History: Past Surgical History:  Procedure Laterality Date   CATARACT EXTRACTION     GALLBLADDER SURGERY      Home Medications:  Allergies as of 07/22/2022       Reactions   Other Hives   Sulfa Antibiotics Hives   Sulphadimidine [sulfamethazine]         Medication List        Accurate as of July 22, 2022 11:54 AM. If you have any questions, ask your nurse or doctor.          allopurinol 100 MG tablet Commonly known as: ZYLOPRIM Take 100 mg by mouth at bedtime.   atorvastatin 10 MG tablet Commonly known as: LIPITOR Take 10 mg by mouth daily.   Calcium Carb-Cholecalciferol 600-500 MG-UNIT Caps Take 1 tablet by mouth daily.   ciprofloxacin 500 MG tablet Commonly known as: Cipro Take 1 tablet (500 mg total) by mouth 2 (two) times daily for 10 days.   clopidogrel 75 MG tablet Commonly known as: PLAVIX Take 75 mg by mouth daily.   colchicine 0.6 MG tablet Take 0.6-1.2 mg by mouth daily as needed (acute gout flare).   fexofenadine 180 MG tablet Commonly known as: ALLEGRA Take 180 mg by mouth daily as needed for allergies.   fluticasone 50 MCG/ACT nasal spray Commonly  known as: FLONASE Place 2 sprays into both nostrils daily as needed for allergies.   folic acid 846 MCG tablet Commonly known as: FOLVITE Take 400 mcg by mouth daily.   lamoTRIgine 25 MG tablet Commonly known as: LAMICTAL Take 50 mg by mouth 2 (two) times daily.   pantoprazole 20 MG tablet Commonly known as: PROTONIX Take 20 mg by mouth daily.   sertraline 50 MG tablet Commonly known as: ZOLOFT Take 50 mg by mouth daily.   tamsulosin 0.4 MG Caps capsule Commonly known as: FLOMAX Take 0.4 mg by mouth daily.   Vitamin D 50 MCG (2000 UT) tablet Take 2,000 Units by mouth daily.        Allergies:  Allergies  Allergen Reactions   Other Hives   Sulfa Antibiotics Hives   Sulphadimidine [Sulfamethazine]      Family History: Family History  Problem Relation Age of Onset   Kidney failure Mother    Stroke Father     Social History:  reports that he has never smoked. He has never used smokeless tobacco. He reports current alcohol use of about 3.0 standard drinks of alcohol per week. He reports that he does not use drugs.   Physical Exam: BP 97/62   Pulse 70   Ht '5\' 9"'$  (1.753 m)   Wt 152 lb (68.9 kg)   BMI 22.45 kg/m   Constitutional:  Alert and oriented, No acute distress. HEENT: [place] AT, moist mucus membranes.  Trachea midline, no masses. Neurologic: Grossly intact, no focal deficits, moving all 4 extremities. Psychiatric: Normal mood and affect.  Assessment & Plan:    Urinary tract infection - He'll complete his course of antibiotics later today.  - We'll recheck a urine in a few weeks.  2. Urinary retention - Fill and pull voiding trial today, he was able to urinate some after fluid instilled to the bladder upon Foley removal. -. He'll return this afternoon only if he's having trouble urinating. - Continue Flomax  3. History of prostate cancer - s/p brachy seed implantation in 2012 as PSA remains undetectable.  Return in about 2 weeks (around 08/05/2022) for IPSS, UA, PVR.  I have reviewed the above documentation for accuracy and completeness, and I agree with the above.   Troy Espy, MD   [facility] [address] [place], [place] [address] [phone]

## 2022-07-29 ENCOUNTER — Ambulatory Visit: Payer: Self-pay | Admitting: Physician Assistant

## 2022-07-29 ENCOUNTER — Ambulatory Visit: Payer: Self-pay | Admitting: Urology

## 2022-08-04 NOTE — Progress Notes (Deleted)
08/05/2022 12:28 PM   Troy Pugh 12-30-1940 [id number]  Referring provider: Idelle Crouch, MD [facility] [place],  [place] [address]  Urological history: 1. Prostate cancer -PSA (04/2022) 0.01 -s/p brachy seed (2012)   2. Urinary retention -?  Foley placement in ED without recorded residual -Tamsulosin 0.4 mg daily   No chief complaint on file.   HPI: Troy Pugh. is a 82 y.o. male who presents today for 2-week follow-up after having a Foley catheter placed in the ED for possible urinary retention.  I  PSS ***  PVR ***  UA ***    Score:  1-7 Mild 8-19 Moderate 20-35 Severe    PMH: Past Medical History:  Diagnosis Date   Carotid stent occlusion (HCC)    High cholesterol    Hypertension    Stroke [facility])    Vascular dementia [facility])     Surgical History: Past Surgical History:  Procedure Laterality Date   CATARACT EXTRACTION     GALLBLADDER SURGERY      Home Medications:  Allergies as of 08/05/2022       Reactions   Other Hives   Sulfa Antibiotics Hives   Sulphadimidine [sulfamethazine]         Medication List        Accurate as of August 04, 2022 12:28 PM. If you have any questions, ask your nurse or doctor.          allopurinol 100 MG tablet Commonly known as: ZYLOPRIM Take 100 mg by mouth at bedtime.   atorvastatin 10 MG tablet Commonly known as: LIPITOR Take 10 mg by mouth daily.   Calcium Carb-Cholecalciferol 600-500 MG-UNIT Caps Take 1 tablet by mouth daily.   clopidogrel 75 MG tablet Commonly known as: PLAVIX Take 75 mg by mouth daily.   colchicine 0.6 MG tablet Take 0.6-1.2 mg by mouth daily as needed (acute gout flare).   fexofenadine 180 MG tablet Commonly known as: ALLEGRA Take 180 mg by mouth daily as needed for allergies.   fluticasone 50 MCG/ACT nasal spray Commonly known as: FLONASE Place 2 sprays into both nostrils daily as needed for allergies.   folic  acid A999333 MCG tablet Commonly known as: FOLVITE Take 400 mcg by mouth daily.   lamoTRIgine 25 MG tablet Commonly known as: LAMICTAL Take 50 mg by mouth 2 (two) times daily.   pantoprazole 20 MG tablet Commonly known as: PROTONIX Take 20 mg by mouth daily.   sertraline 50 MG tablet Commonly known as: ZOLOFT Take 50 mg by mouth daily.   tamsulosin 0.4 MG Caps capsule Commonly known as: FLOMAX Take 0.4 mg by mouth daily.   Vitamin D 50 MCG (2000 UT) tablet Take 2,000 Units by mouth daily.        Allergies:  Allergies  Allergen Reactions   Other Hives   Sulfa Antibiotics Hives   Sulphadimidine [Sulfamethazine]     Family History: Family History  Problem Relation Age of Onset   Kidney failure Mother    Stroke Father     Social History:  reports that he has never smoked. He has never used smokeless tobacco. He reports current alcohol use of about 3.0 standard drinks of alcohol per week. He reports that he does not use drugs.  ROS: Pertinent ROS in HPI  Physical Exam: There were no vitals taken for this visit.  Constitutional:  Well nourished. Alert and oriented, No acute distress. HEENT: [place] AT, moist  mucus membranes.  Trachea midline, no masses. Cardiovascular: No clubbing, cyanosis, or edema. Respiratory: Normal respiratory effort, no increased work of breathing. GI: Abdomen is soft, non tender, non distended, no abdominal masses. Liver and spleen not palpable.  No hernias appreciated.  Stool sample for occult testing is not indicated.   GU: No CVA tenderness.  No bladder fullness or masses.  Patient with circumcised/uncircumcised phallus. ***Foreskin easily retracted***  Urethral meatus is patent.  No penile discharge. No penile lesions or rashes. Scrotum without lesions, cysts, rashes and/or edema.  Testicles are located scrotally bilaterally. No masses are appreciated in the testicles. Left and right epididymis are normal. Rectal: Patient with  normal sphincter  tone. Anus and perineum without scarring or rashes. No rectal masses are appreciated. Prostate is approximately *** grams, *** nodules are appreciated. Seminal vesicles are normal. Skin: No rashes, bruises or suspicious lesions. Lymph: No cervical or inguinal adenopathy. Neurologic: Grossly intact, no focal deficits, moving all 4 extremities. Psychiatric: Normal mood and affect.  Laboratory Data: Lab Results  Component Value Date   WBC 6.3 07/12/2022   HGB 12.6 (L) 07/12/2022   HCT 38.2 (L) 07/12/2022   MCV 92.5 07/12/2022   PLT 191 07/12/2022    Lab Results  Component Value Date   CREATININE 1.13 07/12/2022    Lab Results  Component Value Date   AST 21 07/12/2022   Lab Results  Component Value Date   ALT 16 07/12/2022     Urinalysis ***   I have reviewed the labs.   Pertinent Imaging: ***   Assessment & Plan:  ***  1.  Urinary retention *** No follow-ups on file.  These notes generated with voice recognition software. I apologize for typographical errors.  [place], [place] [address]  [place], [place] [address] [phone]

## 2022-08-05 ENCOUNTER — Ambulatory Visit: Payer: Federal, State, Local not specified - PPO | Admitting: Urology

## 2022-08-05 DIAGNOSIS — R339 Retention of urine, unspecified: Secondary | ICD-10-CM

## 2022-08-06 ENCOUNTER — Encounter: Payer: Self-pay | Admitting: Urology

## 2022-08-19 NOTE — Progress Notes (Signed)
08/20/2022 5:59 PM   Troy Pugh Sep 02, 1940 [id number]  Referring provider: Idelle Crouch, MD [place] [facility] [place],  [place] [address]  Urological history: 1. Prostate cancer -PSA (04/2022) 0.01 -s/p brachy seed (2012)   2. Urinary retention -?  Foley placement in ED without recorded residual -Tamsulosin 0.4 mg daily   Chief Complaint  Patient presents with   Other    HPI: Troy Pugh. is a 82 y.o. male who presents today for 2-week follow-up after having a Foley catheter placed in the ED for possible urinary retention with his wife, Troy Pugh.  He states he has been voiding well without difficulty.  Patient denies any modifying or aggravating factors.  Patient denies any gross hematuria, dysuria or suprapubic/flank pain.  Patient denies any fevers, chills, nausea or vomiting.    I PSS 22/2  PVR 133 mL  UA yellow clear, specific gravity 1.020, pH 5.5, 0-5 WBCs, 0-2 RBCs, 0-10 epithelial cells, hyaline casts present, mucus threads present and a few bacteria.   IPSS     Row Name 08/20/22 1400         International Prostate Symptom Score   How often have you had the sensation of not emptying your bladder? About half the time     How often have you had to urinate less than every two hours? About half the time     How often have you found you stopped and started again several times when you urinated? More than half the time     How often have you found it difficult to postpone urination? Less than half the time     How often have you had a weak urinary stream? More than half the time     How often have you had to strain to start urination? More than half the time     How many times did you typically get up at night to urinate? 2 Times     Total IPSS Score 22       Quality of Life due to urinary symptoms   If you were to spend the rest of your life with your urinary condition just the way it is now how would you  feel about that? Mostly Satisfied              Score:  1-7 Mild 8-19 Moderate 20-35 Severe    PMH: Past Medical History:  Diagnosis Date   Carotid stent occlusion (HCC)    High cholesterol    Hypertension    Stroke [facility])    Vascular dementia [facility])     Surgical History: Past Surgical History:  Procedure Laterality Date   CATARACT EXTRACTION     GALLBLADDER SURGERY      Home Medications:  Allergies as of 08/20/2022       Reactions   Other Hives   Sulfa Antibiotics Hives   Sulphadimidine [sulfamethazine]         Medication List        Accurate as of August 20, 2022 11:59 PM. If you have any questions, ask your nurse or doctor.          allopurinol 100 MG tablet Commonly known as: ZYLOPRIM Take 100 mg by mouth at bedtime.   atorvastatin 10 MG tablet Commonly known as: LIPITOR Take 10 mg by mouth daily.   Calcium Carb-Cholecalciferol 600-500 MG-UNIT Caps Take 1 tablet by mouth daily.   clopidogrel 75 MG  tablet Commonly known as: PLAVIX Take 75 mg by mouth daily.   colchicine 0.6 MG tablet Take 0.6-1.2 mg by mouth daily as needed (acute gout flare).   fexofenadine 180 MG tablet Commonly known as: ALLEGRA Take 180 mg by mouth daily as needed for allergies.   fluticasone 50 MCG/ACT nasal spray Commonly known as: FLONASE Place 2 sprays into both nostrils daily as needed for allergies.   folic acid A999333 MCG tablet Commonly known as: FOLVITE Take 400 mcg by mouth daily.   lamoTRIgine 25 MG tablet Commonly known as: LAMICTAL Take 50 mg by mouth 2 (two) times daily.   pantoprazole 20 MG tablet Commonly known as: PROTONIX Take 20 mg by mouth daily.   sertraline 50 MG tablet Commonly known as: ZOLOFT Take 50 mg by mouth daily.   tamsulosin 0.4 MG Caps capsule Commonly known as: FLOMAX Take 0.4 mg by mouth daily.   Vitamin D 50 MCG (2000 UT) tablet Take 2,000 Units by mouth daily.        Allergies:  Allergies  Allergen  Reactions   Other Hives   Sulfa Antibiotics Hives   Sulphadimidine [Sulfamethazine]     Family History: Family History  Problem Relation Age of Onset   Kidney failure Mother    Stroke Father     Social History:  reports that he has never smoked. He has never used smokeless tobacco. He reports current alcohol use of about 3.0 standard drinks of alcohol per week. He reports that he does not use drugs.  ROS: Pertinent ROS in HPI  Physical Exam: BP (!) 146/72   Pulse 79   Ht '5\' 10"'$  (1.778 m)   Wt 152 lb (68.9 kg)   BMI 21.81 kg/m   Constitutional:  Well nourished. Alert and oriented, No acute distress. HEENT: [place] AT, moist mucus membranes.  Trachea midline Cardiovascular: No clubbing, cyanosis, or edema. Respiratory: Normal respiratory effort, no increased work of breathing. Neurologic: Grossly intact, no focal deficits, moving all 4 extremities. Psychiatric: Normal mood and affect.  Laboratory Data: Lab Results  Component Value Date   WBC 6.3 07/12/2022   HGB 12.6 (L) 07/12/2022   HCT 38.2 (L) 07/12/2022   MCV 92.5 07/12/2022   PLT 191 07/12/2022    Lab Results  Component Value Date   CREATININE 1.13 07/12/2022    Lab Results  Component Value Date   AST 21 07/12/2022   Lab Results  Component Value Date   ALT 16 07/12/2022     Urinalysis See EPIC and HPI I have reviewed the labs.   Pertinent Imaging:  08/20/22 14:50  Scan Result 133     Assessment & Plan:    1.  Urinary retention -resolved at this time -reviewed return precautions  Return in about 1 month (around 09/18/2022) for IPSS and PVR.  These notes generated with voice recognition software. I apologize for typographical errors.  [place], [place] [address]  [place], [place] [address] [phone]

## 2022-08-20 ENCOUNTER — Encounter: Payer: Self-pay | Admitting: Urology

## 2022-08-20 ENCOUNTER — Ambulatory Visit (INDEPENDENT_AMBULATORY_CARE_PROVIDER_SITE_OTHER): Payer: Medicare Other | Admitting: Urology

## 2022-08-20 VITALS — BP 146/72 | HR 79 | Ht 70.0 in | Wt 152.0 lb

## 2022-08-20 DIAGNOSIS — R339 Retention of urine, unspecified: Secondary | ICD-10-CM | POA: Diagnosis not present

## 2022-08-20 LAB — URINALYSIS, COMPLETE
Bilirubin, UA: NEGATIVE
Glucose, UA: NEGATIVE
Ketones, UA: NEGATIVE
Leukocytes,UA: NEGATIVE
Nitrite, UA: NEGATIVE
Protein,UA: NEGATIVE
RBC, UA: NEGATIVE
Specific Gravity, UA: 1.02 (ref 1.005–1.030)
Urobilinogen, Ur: 0.2 mg/dL (ref 0.2–1.0)
pH, UA: 5.5 (ref 5.0–7.5)

## 2022-08-20 LAB — MICROSCOPIC EXAMINATION

## 2022-08-20 LAB — BLADDER SCAN AMB NON-IMAGING: Scan Result: 133

## 2022-09-16 NOTE — Progress Notes (Deleted)
09/18/2022 10:35 PM   Troy Pugh 1940/11/29 [id number]  Referring provider: Idelle Crouch, MD [place] [facility] [place],  [place] [address]  Urological history: 1. Prostate cancer -PSA (04/2022) 0.01 -s/p brachy seed (2012)   2. Urinary retention -?  Foley placement in ED without recorded residual -Tamsulosin 0.4 mg daily   No chief complaint on file.   HPI: Troy Pugh. is a 82 y.o. male who presents today for 2-week follow-up after having a Foley catheter placed in the ED for possible urinary retention with his wife, Troy Pugh.  He states he has been voiding well without difficulty.  Patient denies any modifying or aggravating factors.  Patient denies any gross hematuria, dysuria or suprapubic/flank pain.  Patient denies any fevers, chills, nausea or vomiting.    I PSS 22/2  PVR 133 mL  UA yellow clear, specific gravity 1.020, pH 5.5, 0-5 WBCs, 0-2 RBCs, 0-10 epithelial cells, hyaline casts present, mucus threads present and a few bacteria.     Score:  1-7 Mild 8-19 Moderate 20-35 Severe    PMH: Past Medical History:  Diagnosis Date   Carotid stent occlusion (HCC)    High cholesterol    Hypertension    Stroke [facility])    Vascular dementia [facility])     Surgical History: Past Surgical History:  Procedure Laterality Date   CATARACT EXTRACTION     GALLBLADDER SURGERY      Home Medications:  Allergies as of 09/18/2022       Reactions   Other Hives   Sulfa Antibiotics Hives   Sulphadimidine [sulfamethazine]         Medication List        Accurate as of September 16, 2022 10:35 PM. If you have any questions, ask your nurse or doctor.          allopurinol 100 MG tablet Commonly known as: ZYLOPRIM Take 100 mg by mouth at bedtime.   atorvastatin 10 MG tablet Commonly known as: LIPITOR Take 10 mg by mouth daily.   Calcium Carb-Cholecalciferol 600-500 MG-UNIT Caps Take 1 tablet by mouth daily.    clopidogrel 75 MG tablet Commonly known as: PLAVIX Take 75 mg by mouth daily.   colchicine 0.6 MG tablet Take 0.6-1.2 mg by mouth daily as needed (acute gout flare).   fexofenadine 180 MG tablet Commonly known as: ALLEGRA Take 180 mg by mouth daily as needed for allergies.   fluticasone 50 MCG/ACT nasal spray Commonly known as: FLONASE Place 2 sprays into both nostrils daily as needed for allergies.   folic acid A999333 MCG tablet Commonly known as: FOLVITE Take 400 mcg by mouth daily.   lamoTRIgine 25 MG tablet Commonly known as: LAMICTAL Take 50 mg by mouth 2 (two) times daily.   pantoprazole 20 MG tablet Commonly known as: PROTONIX Take 20 mg by mouth daily.   sertraline 50 MG tablet Commonly known as: ZOLOFT Take 50 mg by mouth daily.   tamsulosin 0.4 MG Caps capsule Commonly known as: FLOMAX Take 0.4 mg by mouth daily.   Vitamin D 50 MCG (2000 UT) tablet Take 2,000 Units by mouth daily.        Allergies:  Allergies  Allergen Reactions   Other Hives   Sulfa Antibiotics Hives   Sulphadimidine [Sulfamethazine]     Family History: Family History  Problem Relation Age of Onset   Kidney failure Mother    Stroke Father  Social History:  reports that he has never smoked. He has never used smokeless tobacco. He reports current alcohol use of about 3.0 standard drinks of alcohol per week. He reports that he does not use drugs.  ROS: Pertinent ROS in HPI  Physical Exam: There were no vitals taken for this visit.  Constitutional:  Well nourished. Alert and oriented, No acute distress. HEENT: [place] AT, moist mucus membranes.  Trachea midline Cardiovascular: No clubbing, cyanosis, or edema. Respiratory: Normal respiratory effort, no increased work of breathing. Neurologic: Grossly intact, no focal deficits, moving all 4 extremities. Psychiatric: Normal mood and affect.  Laboratory Data: Lab Results  Component Value Date   WBC 6.3 07/12/2022   HGB 12.6  (L) 07/12/2022   HCT 38.2 (L) 07/12/2022   MCV 92.5 07/12/2022   PLT 191 07/12/2022    Lab Results  Component Value Date   CREATININE 1.13 07/12/2022    Lab Results  Component Value Date   AST 21 07/12/2022   Lab Results  Component Value Date   ALT 16 07/12/2022     Urinalysis See EPIC and HPI I have reviewed the labs.   Pertinent Imaging:  08/20/22 14:50  Scan Result 133     Assessment & Plan:    1.  Urinary retention -resolved at this time -reviewed return precautions  No follow-ups on file.  These notes generated with voice recognition software. I apologize for typographical errors.  [place], Pugh [address]  Troy Pugh, [place] [address] [phone]

## 2022-09-17 NOTE — Progress Notes (Unsigned)
09/18/2022 4:49 PM   Troy Pugh September 30, 1940 [id number]  Referring provider: Idelle Crouch, MD [facility] [place],  [place] [address]  Urological history: 1. Prostate cancer -PSA (04/2022) 0.01 -s/p brachy seed (2012)   2. Urinary retention -?  Foley placement in ED without recorded residual -Tamsulosin 0.4 mg daily   No chief complaint on file.   HPI: Troy Pugh. is a 82 y.o. male who presents today for 80-month follow-up after having a Foley catheter placed in the ED for possible urinary retention with his wife, Troy Pugh.   I PSS ***  PVR *** mL   Score:  1-7 Mild 8-19 Moderate 20-35 Severe    PMH: Past Medical History:  Diagnosis Date   Carotid stent occlusion (HCC)    High cholesterol    Hypertension    Stroke [facility])    Vascular dementia [facility])     Surgical History: Past Surgical History:  Procedure Laterality Date   CATARACT EXTRACTION     GALLBLADDER SURGERY      Home Medications:  Allergies as of 09/18/2022       Reactions   Other Hives   Sulfa Antibiotics Hives   Sulphadimidine [sulfamethazine]         Medication List        Accurate as of September 17, 2022  4:49 PM. If you have any questions, ask your nurse or doctor.          allopurinol 100 MG tablet Commonly known as: ZYLOPRIM Take 100 mg by mouth at bedtime.   atorvastatin 10 MG tablet Commonly known as: LIPITOR Take 10 mg by mouth daily.   Calcium Carb-Cholecalciferol 600-500 MG-UNIT Caps Take 1 tablet by mouth daily.   clopidogrel 75 MG tablet Commonly known as: PLAVIX Take 75 mg by mouth daily.   colchicine 0.6 MG tablet Take 0.6-1.2 mg by mouth daily as needed (acute gout flare).   fexofenadine 180 MG tablet Commonly known as: ALLEGRA Take 180 mg by mouth daily as needed for allergies.   fluticasone 50 MCG/ACT nasal spray Commonly known as: FLONASE Place 2 sprays into both nostrils daily as needed  for allergies.   folic acid A999333 MCG tablet Commonly known as: FOLVITE Take 400 mcg by mouth daily.   lamoTRIgine 25 MG tablet Commonly known as: LAMICTAL Take 50 mg by mouth 2 (two) times daily.   pantoprazole 20 MG tablet Commonly known as: PROTONIX Take 20 mg by mouth daily.   sertraline 50 MG tablet Commonly known as: ZOLOFT Take 50 mg by mouth daily.   tamsulosin 0.4 MG Caps capsule Commonly known as: FLOMAX Take 0.4 mg by mouth daily.   Vitamin D 50 MCG (2000 UT) tablet Take 2,000 Units by mouth daily.        Allergies:  Allergies  Allergen Reactions   Other Hives   Sulfa Antibiotics Hives   Sulphadimidine [Sulfamethazine]     Family History: Family History  Problem Relation Age of Onset   Kidney failure Mother    Stroke Father     Social History:  reports that he has never smoked. He has never used smokeless tobacco. He reports current alcohol use of about 3.0 standard drinks of alcohol per week. He reports that he does not use drugs.  ROS: Pertinent ROS in HPI  Physical Exam: There were no vitals taken for this visit.  Constitutional:  Well nourished. Alert and oriented, No acute distress.  HEENT: [place] AT, moist mucus membranes.  Trachea midline Cardiovascular: No clubbing, cyanosis, or edema. Respiratory: Normal respiratory effort, no increased work of breathing. GU: No CVA tenderness.  No bladder fullness or masses.  Patient with circumcised/uncircumcised phallus. ***Foreskin easily retracted***  Urethral meatus is patent.  No penile discharge. No penile lesions or rashes. Scrotum without lesions, cysts, rashes and/or edema.  Testicles are located scrotally bilaterally. No masses are appreciated in the testicles. Left and right epididymis are normal. Rectal: Patient with  normal sphincter tone. Anus and perineum without scarring or rashes. No rectal masses are appreciated. Prostate is approximately *** grams, *** nodules are appreciated. Seminal vesicles  are normal. Neurologic: Grossly intact, no focal deficits, moving all 4 extremities. Psychiatric: Normal mood and affect.   Laboratory Data: N/A  Pertinent Imaging: ***    Assessment & Plan:    1.  Urinary retention -resolved at this time -reviewed return precautions  No follow-ups on file.  These notes generated with voice recognition software. I apologize for typographical errors.  [place], [place] [address]  [place], [place] [address] [phone]

## 2022-09-18 ENCOUNTER — Ambulatory Visit (INDEPENDENT_AMBULATORY_CARE_PROVIDER_SITE_OTHER): Payer: Medicare Other | Admitting: Urology

## 2022-09-18 ENCOUNTER — Ambulatory Visit: Payer: Federal, State, Local not specified - PPO | Admitting: Urology

## 2022-09-18 ENCOUNTER — Encounter: Payer: Self-pay | Admitting: Urology

## 2022-09-18 VITALS — BP 113/69 | HR 77 | Ht 67.0 in | Wt 150.0 lb

## 2022-09-18 DIAGNOSIS — N401 Enlarged prostate with lower urinary tract symptoms: Secondary | ICD-10-CM

## 2022-09-18 DIAGNOSIS — R339 Retention of urine, unspecified: Secondary | ICD-10-CM | POA: Diagnosis not present

## 2022-09-18 DIAGNOSIS — N138 Other obstructive and reflux uropathy: Secondary | ICD-10-CM

## 2022-09-18 LAB — BLADDER SCAN AMB NON-IMAGING

## 2022-09-18 MED ORDER — FINASTERIDE 5 MG PO TABS: 5.0000 mg | ORAL_TABLET | Freq: Every day | ORAL | 3 refills | Status: DC

## 2022-12-15 NOTE — Progress Notes (Unsigned)
12/16/2022 2:41 PM   Troy Pugh [birth year] [id number]  Referring provider: Marguarite Arbour, MD [address] [facility] [place],  [place] [address]  Urological history: 1. Prostate cancer -PSA (04/2022) 0.01 -s/p brachy seed (2012)   2. Urinary retention -?  Foley placement in ED without recorded residual -Tamsulosin 0.4 mg daily and finasteride 5 mg daily   Chief Complaint  Patient presents with   Urinary Retention    HPI: Troy Pugh. is a 82 y.o. male who presents today for 55-month follow-up after with his wife, Troy Pugh.  I PSS 8/1  PVR 121 mL  He was having some dizzy spells, so she stopped the Proscar.  Patient denies any modifying or aggravating factors.  Patient denies any recent UTI's, gross hematuria, dysuria or suprapubic/flank pain.  Patient denies any fevers, chills, nausea or vomiting.    IPSS     Row Name 12/16/22 1400         International Prostate Symptom Score   How often have you had the sensation of not emptying your bladder? Less than 1 in 5     How often have you had to urinate less than every two hours? Less than half the time     How often have you found you stopped and started again several times when you urinated? Less than 1 in 5 times     How often have you found it difficult to postpone urination? Less than 1 in 5 times     How often have you had a weak urinary stream? Less than 1 in 5 times     How often have you had to strain to start urination? Less than 1 in 5 times     How many times did you typically get up at night to urinate? 1 Time     Total IPSS Score 8       Quality of Life due to urinary symptoms   If you were to spend the rest of your life with your urinary condition just the way it is now how would you feel about that? Pleased               Score:  1-7 Mild 8-19 Moderate 20-35 Severe   PMH: Past Medical History:  Diagnosis Date   Carotid stent occlusion (HCC)     High cholesterol    Hypertension    Stroke [facility])    Vascular dementia [facility])     Surgical History: Past Surgical History:  Procedure Laterality Date   CATARACT EXTRACTION     GALLBLADDER SURGERY      Home Medications:  Allergies as of 12/16/2022       Reactions   Other Hives   Sulfa Antibiotics Hives   Sulphadimidine [sulfamethazine]         Medication List        Accurate as of December 16, 2022  2:41 PM. If you have any questions, ask your nurse or doctor.          allopurinol 100 MG tablet Commonly known as: ZYLOPRIM Take 100 mg by mouth at bedtime.   atorvastatin 10 MG tablet Commonly known as: LIPITOR Take 10 mg by mouth daily.   Calcium Carb-Cholecalciferol 600-500 MG-UNIT Caps Take 1 tablet by mouth daily.   clopidogrel 75 MG tablet Commonly known as: PLAVIX Take 75 mg by mouth daily.   colchicine 0.6 MG tablet Take 0.6-1.2 mg by mouth daily  as needed (acute gout flare).   fexofenadine 180 MG tablet Commonly known as: ALLEGRA Take 180 mg by mouth daily as needed for allergies.   finasteride 5 MG tablet Commonly known as: Proscar Take 1 tablet (5 mg total) by mouth daily.   fluticasone 50 MCG/ACT nasal spray Commonly known as: FLONASE Place 2 sprays into both nostrils daily as needed for allergies.   folic acid 400 MCG tablet Commonly known as: FOLVITE Take 400 mcg by mouth daily.   lamoTRIgine 25 MG tablet Commonly known as: LAMICTAL Take 50 mg by mouth 2 (two) times daily.   pantoprazole 20 MG tablet Commonly known as: PROTONIX Take 20 mg by mouth daily.   sertraline 50 MG tablet Commonly known as: ZOLOFT Take 50 mg by mouth daily.   tamsulosin 0.4 MG Caps capsule Commonly known as: FLOMAX Take 0.4 mg by mouth daily.   Vitamin D 50 MCG (2000 UT) tablet Take 2,000 Units by mouth daily.        Allergies:  Allergies  Allergen Reactions   Other Hives   Sulfa Antibiotics Hives   Sulphadimidine [Sulfamethazine]     Family  History: Family History  Problem Relation Age of Onset   Kidney failure Mother    Stroke Father     Social History:  reports that he has never smoked. He has never used smokeless tobacco. He reports current alcohol use of about 3.0 standard drinks of alcohol per week. He reports that he does not use drugs.  ROS: Pertinent ROS in HPI  Physical Exam: BP 123/63   Pulse 80   Ht 5\' 9"  (1.753 m)   Wt 154 lb (69.9 kg)   BMI 22.74 kg/m   Constitutional:  Well nourished. Alert and oriented, No acute distress. HEENT: [place] AT, moist mucus membranes.  Trachea midline Cardiovascular: No clubbing, cyanosis, or edema. Respiratory: Normal respiratory effort, no increased work of breathing. Neurologic: Grossly intact, no focal deficits, moving all 4 extremities. Psychiatric: Normal mood and affect.   Laboratory Data: Comprehensive Metabolic Panel (CMP) Order: [id number] Component Ref Range & Units 2 mo ago  Glucose 70 - 110 mg/dL 88  Sodium 409 - 811 mmol/L 139  Potassium 3.6 - 5.1 mmol/L 4.1  Chloride 97 - 109 mmol/L 104  Carbon Dioxide (CO2) 22.0 - 32.0 mmol/L 31.4  Urea Nitrogen (BUN) 7 - 25 mg/dL 21  Creatinine 0.7 - 1.3 mg/dL 1.0  Glomerular Filtration Rate (eGFR) >60 mL/min/1.73sq m 76  Comment: CKD-EPI (2021) does not include patient's race in the calculation of eGFR.  Monitoring changes of plasma creatinine and eGFR over time is useful for monitoring kidney function.  Interpretive Ranges for eGFR (CKD-EPI 2021):  eGFR:       >60 mL/min/1.73 sq. m - Normal eGFR:       30-59 mL/min/1.73 sq. m - Moderately Decreased eGFR:       15-29 mL/min/1.73 sq. m  - Severely Decreased eGFR:       < 15 mL/min/1.73 sq. m  - Kidney Failure   Note: These eGFR calculations do not apply in acute situations when eGFR is changing rapidly or patients on dialysis.  Calcium 8.7 - 10.3 mg/dL 9.1  AST 8 - 39 U/L 16  ALT 6 - 57 U/L 14  Alk Phos (alkaline Phosphatase) 34 - 104 U/L 59   Albumin 3.5 - 4.8 g/dL 3.9  Bilirubin, Total 0.3 - 1.2 mg/dL 0.5  Protein, Total 6.1 - 7.9 g/dL 5.9 Low   A/G Ratio  1.0 - 5.0 gm/dL 2.0  Resulting Agency [facility] - LAB   Specimen Collected: 09/22/22 14:36   Performed by: [facility] Last Resulted: 09/22/22 17:11  Received From: [facility]  Result Received: 12/15/22 11:21   Urinalysis w/Microscopic Order: [id number] Component Ref Range & Units 2 mo ago  Color Colorless, Straw, Light Yellow, Yellow, Dark Yellow Light Yellow  Clarity Clear Clear  Specific Gravity 1.005 - 1.030 1.019  pH, Urine 5.0 - 8.0 7.0  Protein, Urinalysis Negative mg/dL Negative  Glucose, Urinalysis Negative mg/dL Negative  Ketones, Urinalysis Negative mg/dL Negative  Blood, Urinalysis Negative Negative  Nitrite, Urinalysis Negative Negative  Leukocyte Esterase, Urinalysis Negative Negative  Bilirubin, Urinalysis Negative Negative  Urobilinogen, Urinalysis 0.2 - 1.0 mg/dL 0.2  WBC, UA <=5 /hpf <1  Red Blood Cells, Urinalysis <=3 /hpf 1  Bacteria, Urinalysis 0 - 5 /hpf 0-5  Squamous Epithelial Cells, Urinalysis /hpf 0  Resulting Agency [facility]   Specimen Collected: 09/22/22 14:36   Performed by: [facility] Last Resulted: 09/22/22 15:34  Received From: [facility]  Result Received: 12/15/22 11:21  I have reviewed the labs.   Pertinent Imaging:  12/16/22 14:19  Scan Result 121     Assessment & Plan:    1.  Urinary retention -resolved at this time -reviewed return precautions  2. BPH with LUTS -PVR < 300 cc  -continue conservative management, avoiding bladder irritants and timed voiding's -Continue tamsulosin 0.4 mg daily -Explained that the dizzy spells are more likely be due to the tamsulosin rather than the finasteride, I reminded him to restart the finasteride again and see if the dizzy spells return and if so stop the  medication otherwise continue the finasteride till we see he in 6 months  Return in about 6 months (around 06/17/2023) for IPSS and PVR.  These notes generated with voice recognition software. I apologize for typographical errors.  Cloretta Ned  [facility] [address]  [address] [place], [place] [address] [phone]

## 2022-12-16 ENCOUNTER — Encounter: Payer: Self-pay | Admitting: Urology

## 2022-12-16 ENCOUNTER — Ambulatory Visit: Payer: Medicare Other | Admitting: Urology

## 2022-12-16 VITALS — BP 123/63 | HR 80 | Ht 69.0 in | Wt 154.0 lb

## 2022-12-16 DIAGNOSIS — R339 Retention of urine, unspecified: Secondary | ICD-10-CM

## 2022-12-16 DIAGNOSIS — N401 Enlarged prostate with lower urinary tract symptoms: Secondary | ICD-10-CM | POA: Diagnosis not present

## 2022-12-16 DIAGNOSIS — N138 Other obstructive and reflux uropathy: Secondary | ICD-10-CM

## 2022-12-16 LAB — BLADDER SCAN AMB NON-IMAGING: Scan Result: 121

## 2023-04-11 ENCOUNTER — Ambulatory Visit
Admission: EM | Admit: 2023-04-11 | Discharge: 2023-04-11 | Disposition: A | Discharge status: Home / Self Care | Payer: Medicare Other | Attending: Emergency Medicine | Admitting: Emergency Medicine

## 2023-04-11 ENCOUNTER — Ambulatory Visit (INDEPENDENT_AMBULATORY_CARE_PROVIDER_SITE_OTHER): Payer: Medicare Other

## 2023-04-11 ENCOUNTER — Telehealth: Payer: Self-pay | Admitting: Emergency Medicine

## 2023-04-11 DIAGNOSIS — R0781 Pleurodynia: Secondary | ICD-10-CM

## 2023-04-11 MED ORDER — DICLOFENAC SODIUM 1 % EX GEL: 2.0000 g | Freq: Four times a day (QID) | CUTANEOUS | 1 refills | Status: AC

## 2023-04-11 NOTE — Discharge Instructions (Signed)
Today you are evaluated for rib pain  X-rays pending, you will be notified of results via telephone  We do not surgically fix broken ribs and allow them to naturally heal which typically takes weeks to months but pain should lessen with time  You may continue use of Tylenol taking 500 to 1000 mg  (1 to 2 tablets )every 6 hours  You may apply diclofenac gel over the affected area every 6 hours for additional comfort, this medicine helps to reduce inflammation and helps with pain  You may use compression wrap throughout the day while completing activity to add stability and reduce pain when completing movements  You may apply heat over the affected area in 10 to 15-minute intervals  Whenever sitting and lying you may place pillows behind the back and underneath the arm for comfort and support  If your symptoms continue to persist you may follow-up with his urgent care or your primary doctor as needed for reevaluation

## 2023-04-11 NOTE — ED Triage Notes (Addendum)
Patient presents to UC for a fall x 2 days. Lost his balance and he hit his right rib area, treating pain with Tylenol. He is on a blood thinner. Pain with movement and taking a deep breathe. Unsure if he hit his head. Has bruising to right outer eye area.

## 2023-04-11 NOTE — ED Provider Notes (Signed)
Troy Pugh    CSN: [id number] Arrival date & time: 04/11/23  1154      History   Chief Complaint Chief Complaint  Patient presents with   Fall    HPI Troy Poer. is a 82 y.o. male.   Patient presents for evaluation of right-sided rib pain beginning 2 days ago after fall.  Lost balance causing him to land onto the right side hitting the floor, glasses hitting the side of the face and right eye.  Now experiencing constant pain to the right flank exacerbated by movement, can be felt with deep breathing but denies shortness of breath and wheezing.  Has been using Tylenol which has been somewhat helpful but symptoms have persisted.  Denies hitting head or loss of consciousness.  Past Medical History:  Diagnosis Date   Carotid stent occlusion (HCC)    High cholesterol    Hypertension    Stroke [facility])    Vascular dementia [facility])     Patient Active Problem List   Diagnosis Date Noted   Sepsis (HCC) 01/24/2021   BPH (benign prostatic hyperplasia) 01/24/2021   GERD (gastroesophageal reflux disease) 01/24/2021   B12 deficiency 11/11/2019   Vascular dementia with behavior disturbance (HCC) 07/28/2019   Stroke (HCC) 01/27/2017   High cholesterol 01/27/2017   Hypertension 01/27/2017   Carotid stenosis 01/27/2017   Personal history of prostate cancer 10/17/2016   Nonrheumatic aortic valve stenosis 06/09/2016   Candida infection of genital region 09/07/2015   Decreased energy 01/04/2015   Benign essential hypertension 06/06/2014   Benign non-nodular prostatic hyperplasia with lower urinary tract symptoms 06/06/2014   Pure hypercholesterolemia 06/06/2014   Benign prostatic hyperplasia without lower urinary tract symptoms 01/20/2014   Gout 01/20/2014   Other and unspecified hyperlipidemia 01/20/2014   Cerebral infarction (HCC) 01/20/2014   Cerebrovascular disease 01/20/2014   Occlusion and stenosis of carotid artery with cerebral infarction 07/27/2013    Symptomatic carotid artery stenosis with infarction (HCC) 07/27/2013   Bladder neck obstruction 02/03/2013   Chronic kidney disease, stage III (moderate) (HCC) 08/11/2012   Incomplete emptying of bladder 08/11/2012   Malignant neoplasm of prostate (HCC) 08/11/2012   Retention of urine 08/11/2012    Past Surgical History:  Procedure Laterality Date   CATARACT EXTRACTION     GALLBLADDER SURGERY         Home Medications    Prior to Admission medications   Medication Sig Start Date End Date Taking? Authorizing Provider  diclofenac Sodium (VOLTAREN) 1 % GEL Apply 2 g topically 4 (four) times daily. 04/11/23  Yes Adolph Clutter R, NP  allopurinol (ZYLOPRIM) 100 MG tablet Take 100 mg by mouth at bedtime.    [provider]  atorvastatin (LIPITOR) 10 MG tablet Take 10 mg by mouth daily.    [provider]  Calcium Carb-Cholecalciferol 600-500 MG-UNIT CAPS Take 1 tablet by mouth daily.     [provider]  Cholecalciferol (VITAMIN D) 2000 UNITS tablet Take 2,000 Units by mouth daily.    [provider]  clopidogrel (PLAVIX) 75 MG tablet Take 75 mg by mouth daily.     [provider]  colchicine 0.6 MG tablet Take 0.6-1.2 mg by mouth daily as needed (acute gout flare).    [provider]  fexofenadine (ALLEGRA) 180 MG tablet Take 180 mg by mouth daily as needed for allergies.    [provider]  finasteride (PROSCAR) 5 MG tablet Take 1 tablet (5 mg total) by mouth daily. 09/18/22  Michiel Cowboy A, PA-C  fluticasone (FLONASE) 50 MCG/ACT nasal spray Place 2 sprays into both nostrils daily as needed for allergies.    [provider]  folic acid (FOLVITE) 400 MCG tablet Take 400 mcg by mouth daily.    [provider]  lamoTRIgine (LAMICTAL) 25 MG tablet Take 50 mg by mouth 2 (two) times daily.    [provider]  pantoprazole (PROTONIX) 20 MG tablet Take 20 mg by mouth daily. 03/08/21   [provider]  sertraline (ZOLOFT) 50 MG tablet Take 50 mg by mouth daily.    [provider]  tamsulosin (FLOMAX) 0.4 MG CAPS capsule Take 0.4 mg by mouth daily.    [provider]    Family History Family History  Problem Relation Age of Onset   Kidney failure Mother    Stroke Father     Social History Social History   Tobacco Use   Smoking status: Never   Smokeless tobacco: Never  Substance Use Topics   Alcohol use: Yes    Alcohol/week: 3.0 standard drinks of alcohol    Types: 1 Glasses of wine, 1 Cans of beer, 1 Shots of liquor per week    Comment: occasionally   Drug use: No     Allergies   Other, Sulfa antibiotics, and Sulphadimidine [sulfamethazine]   Review of Systems Review of Systems   Physical Exam Triage Vital Signs ED Triage Vitals  Encounter Vitals Group     BP 04/11/23 1202 108/64     Systolic BP Percentile --      Diastolic BP Percentile --      Pulse Rate 04/11/23 1202 71     Resp 04/11/23 1202 14     Temp 04/11/23 1202 98.5 F (36.9 C)     Temp Source 04/11/23 1202 Temporal     SpO2 04/11/23 1202 96 %     Weight --      Height --      Head Circumference --      Peak Flow --      Pain Score 04/11/23 1204 5     Pain Loc --      Pain Education --      Exclude from Growth Chart --    No data found.  Updated Vital Signs BP 108/64 (BP Location: Right Arm)   Pulse 71   Temp 98.5 F (36.9 C) (Temporal)   Resp 14   SpO2 96%   Visual Acuity Right Eye Distance:   Left Eye Distance:   Bilateral Distance:    Right Eye Near:   Left Eye Near:    Bilateral Near:     Physical Exam Constitutional:      Appearance: Normal appearance.  Eyes:     Comments: Ecchymosis present to the right periorbital with 1 cm abrasion to the right eyebrow, scabbed, no tenderness present, no abnormality to the eye, vision is grossly intact, extraocular movements intact  Cardiovascular:     Rate and Rhythm: Normal rate and regular rhythm.      Pulses: Normal pulses.     Heart sounds: Normal heart sounds.  Pulmonary:     Effort: Pulmonary effort is normal.     Breath sounds: Normal breath sounds.  Chest:     Comments: Tenderness is present to the right flank over ribs 5 through 8 without ecchymosis swelling or deformity, chest wall is symmetrical Neurological:     Mental Status: He is alert and oriented to person, place,  and time. Mental status is at baseline.      UC Treatments / Results  Labs (all labs ordered are listed, but only abnormal results are displayed) Labs Reviewed - No data to display  EKG   Radiology No results found.  Procedures Procedures (including critical care time)  Medications Ordered in UC Medications - No data to display  Initial Impression / Assessment and Plan / UC Course  I have reviewed the triage vital signs and the nursing notes.  Pertinent labs & imaging results that were available during my care of the patient were reviewed by me and considered in my medical decision making (see chart for details).  Rib pain on right side  Vital signs are stable, patient is in no signs of distress nontoxic-appearing, ecchymosis present to the right eye, low suspicion for periorbital fracture, denies hitting head or loss of consciousness, no neurological deficits worse from baseline history of dementia, stable at this time, x-ray of the rib pending, will notify of results via telephone, discussed Tylenol and healing for rib fractures, Ace bandage applied prior to discharge to be used as needed for stability, recommended continued use of Tylenol and discuss dosage and and frequency, prescribed diclofenac gel to be used in addition we will avoid oral NSAIDs due to use of Plavix, recommended heat and pillows for additional supportive care, if symptoms continue to persist or worsen, discussed use of prednisone as an alternative and may follow-up for reevaluation as needed Final Clinical Impressions(s) /  UC Diagnoses   Final diagnoses:  Rib pain on right side     Discharge Instructions      Today you are evaluated for rib pain  X-rays pending, you will be notified of results via telephone  We do not surgically fix broken ribs and allow them to naturally heal which typically takes weeks to months but pain should lessen with time  You may continue use of Tylenol taking 500 to 1000 mg  (1 to 2 tablets )every 6 hours  You may apply diclofenac gel over the affected area every 6 hours for additional comfort, this medicine helps to reduce inflammation and helps with pain  You may use compression wrap throughout the day while completing activity to add stability and reduce pain when completing movements  You may apply heat over the affected area in 10 to 15-minute intervals  Whenever sitting and lying you may place pillows behind the back and underneath the arm for comfort and support  If your symptoms continue to persist you may follow-up with his urgent care or your primary doctor as needed for reevaluation     ED Prescriptions     Medication Sig Dispense Auth. Provider   diclofenac Sodium (VOLTAREN) 1 % GEL Apply 2 g topically 4 (four) times daily. 50 g Valinda Hoar, NP      PDMP not reviewed this encounter.   Valinda Hoar, NP 04/11/23 1239

## 2023-04-11 NOTE — Telephone Encounter (Signed)
Reported x-ray results via telephone to wife, 2 patient identifiers used, encouraged continuing treatment plan as discussed with follow-up as needed

## 2023-04-22 ENCOUNTER — Ambulatory Visit (INDEPENDENT_AMBULATORY_CARE_PROVIDER_SITE_OTHER): Payer: Medicare Other

## 2023-04-22 ENCOUNTER — Encounter (INDEPENDENT_AMBULATORY_CARE_PROVIDER_SITE_OTHER): Payer: Self-pay | Admitting: Nurse Practitioner

## 2023-04-22 ENCOUNTER — Ambulatory Visit (INDEPENDENT_AMBULATORY_CARE_PROVIDER_SITE_OTHER): Payer: Medicare Other | Admitting: Nurse Practitioner

## 2023-04-22 VITALS — BP 119/65 | HR 73 | Resp 16 | Wt 157.0 lb

## 2023-04-22 DIAGNOSIS — I6523 Occlusion and stenosis of bilateral carotid arteries: Secondary | ICD-10-CM

## 2023-04-22 DIAGNOSIS — I10 Essential (primary) hypertension: Secondary | ICD-10-CM

## 2023-04-22 DIAGNOSIS — E7800 Pure hypercholesterolemia, unspecified: Secondary | ICD-10-CM | POA: Diagnosis not present

## 2023-04-27 NOTE — Progress Notes (Signed)
Subjective:    Patient ID: Troy Cal., male    DOB: Dec 24, 1940, 82 y.o.   MRN: [id number] Chief Complaint  Patient presents with   Follow-up    1 yr carotid follow up    Troy Pugh is seen today in clinic for his annual follow up for carotid stenosis which is evaluated by Ultrasound. The patient denies any signs or symptoms related to stroke or TIA. He also denies an amaurosis fugax. He denies any vision changes at this visit. He is taking Plavix 75 mg once a day. Patient denies any headaches, nausea or vomiting. There is no noted history of seizures. The patient denies any symptoms related to PAD or claudication.   The patients duplex Carotid Ultrasound shows 40 to 59% stenosis bilaterally.  Previously the left had 1 to 39% stenosis however the velocities are only slightly different it looks like he was on the high end last year.    Review of Systems  Constitutional: Negative.   Eyes: Negative.   Respiratory: Negative.    Cardiovascular: Negative.   Gastrointestinal: Negative.   Genitourinary: Negative.   Skin: Negative.   Neurological: Negative.   Psychiatric/Behavioral: Negative.    All other systems reviewed and are negative.      Objective:   Physical Exam Vitals reviewed.  Constitutional:      Appearance: Normal appearance. He is normal weight.  HENT:     Head: Normocephalic.  Eyes:     Extraocular Movements: Extraocular movements intact.     Pupils: Pupils are equal, round, and reactive to light.  Cardiovascular:     Rate and Rhythm: Normal rate and regular rhythm.     Pulses: Normal pulses.  Pulmonary:     Effort: Pulmonary effort is normal.  Abdominal:     General: Abdomen is flat. Bowel sounds are normal.     Palpations: Abdomen is soft.  Musculoskeletal:        General: Normal range of motion.     Cervical back: Normal range of motion.  Skin:    General: Skin is warm and dry.     Capillary Refill: Capillary refill takes less than 2 seconds.   Neurological:     General: No focal deficit present.     Mental Status: He is alert and oriented to person, place, and time.  Psychiatric:        Mood and Affect: Mood normal.        Behavior: Behavior normal.        Thought Content: Thought content normal.        Judgment: Judgment normal.     BP 119/65 (BP Location: Right Arm)   Pulse 73   Resp 16   Wt 157 lb (71.2 kg)   BMI 23.18 kg/m   Past Medical History:  Diagnosis Date   Carotid stent occlusion (HCC)    High cholesterol    Hypertension    Stroke (HCC)    Vascular dementia (HCC)     Social History   Socioeconomic History   Marital status: Married    Spouse name: chris   Number of children: 0   Years of education: college   Highest education level: Not on file  Occupational History   Not on file  Tobacco Use   Smoking status: Never   Smokeless tobacco: Never  Substance and Sexual Activity   Alcohol use: Yes    Alcohol/week: 3.0 standard drinks of alcohol    Types: 1 Glasses of  wine, 1 Cans of beer, 1 Shots of liquor per week    Comment: occasionally   Drug use: No   Sexual activity: Not on file  Other Topics Concern   Not on file  Social History Narrative   Patient is married, no children.   Patient is-- handed.   Patient has college education.   Patient drinks -cups daily.   Social Determinants of Health   Financial Resource Strain: Not on file  Food Insecurity: Not on file  Transportation Needs: Not on file  Physical Activity: Not on file  Stress: Not on file  Social Connections: Not on file  Intimate Partner Violence: Not on file    Past Surgical History:  Procedure Laterality Date   CATARACT EXTRACTION     GALLBLADDER SURGERY      Family History  Problem Relation Age of Onset   Kidney failure Mother    Stroke Father     Allergies  Allergen Reactions   Other Hives   Sulfa Antibiotics Hives   Sulphadimidine [Sulfamethazine]        Latest Ref Rng & Units 07/12/2022   11:15  PM 01/26/2021    8:10 AM 01/25/2021    6:13 AM  CBC  WBC 4.0 - 10.5 K/uL 6.3  8.8  13.3   Hemoglobin 13.0 - 17.0 g/dL 40.9  81.1  91.4   Hematocrit 39.0 - 52.0 % 38.2  37.2  34.7   Platelets 150 - 400 K/uL 191  192  170       CMP     Component Value Date/Time   NA 135 07/12/2022 2315   NA 136 03/17/2014 0406   K 4.1 07/12/2022 2315   K 3.2 (L) 03/17/2014 0406   CL 102 07/12/2022 2315   CL 102 03/17/2014 0406   CO2 25 07/12/2022 2315   CO2 24 03/17/2014 0406   GLUCOSE 106 (H) 07/12/2022 2315   GLUCOSE 90 03/17/2014 0406   BUN 23 07/12/2022 2315   BUN 12 03/17/2014 0406   CREATININE 1.13 07/12/2022 2315   CREATININE 1.19 03/17/2014 0406   CALCIUM 8.8 (L) 07/12/2022 2315   CALCIUM 7.9 (L) 03/17/2014 0406   PROT 6.8 07/12/2022 2315   PROT 5.4 (L) 03/17/2014 0406   ALBUMIN 4.0 07/12/2022 2315   ALBUMIN 2.1 (L) 03/17/2014 0406   AST 21 07/12/2022 2315   AST 37 03/17/2014 0406   ALT 16 07/12/2022 2315   ALT 24 03/17/2014 0406   ALKPHOS 50 07/12/2022 2315   ALKPHOS 34 (L) 03/17/2014 0406   BILITOT 0.7 07/12/2022 2315   BILITOT 0.3 03/17/2014 0406   GFRNONAA >60 07/12/2022 2315   GFRNONAA >60 03/17/2014 0406   GFRAA 54 (L) 10/01/2016 2132   GFRAA >60 03/17/2014 0406     No results found.     Assessment & Plan:   1. Bilateral carotid artery stenosis Recommend:  Given the patient's asymptomatic subcritical stenosis no further invasive testing or surgery at this time.   The patients duplex Carotid Ultrasound shows 40 to 59% stenosis bilaterally   Continue antiplatelet therapy as prescribed Continue management of CAD, HTN and Hyperlipidemia Healthy heart diet,  encouraged exercise at least 4 times per week  Follow up in 1 Year with duplex ultrasound of  bilateral carotids and physical exam    2. High cholesterol Continue statin as ordered and reviewed, no changes at this time   3. Primary hypertension Continue cardiac and antihypertensive medications as  already ordered and reviewed, no changes  at this time.  Continue statin as ordered and reviewed, no changes at this time  Nitrates PRN for chest pain    Current Outpatient Medications on File Prior to Visit  Medication Sig Dispense Refill   allopurinol (ZYLOPRIM) 100 MG tablet Take 100 mg by mouth at bedtime.     atorvastatin (LIPITOR) 10 MG tablet Take 10 mg by mouth daily.     Calcium Carb-Cholecalciferol 600-500 MG-UNIT CAPS Take 1 tablet by mouth daily.      Cholecalciferol (VITAMIN D) 2000 UNITS tablet Take 2,000 Units by mouth daily.     clopidogrel (PLAVIX) 75 MG tablet Take 75 mg by mouth daily.      colchicine 0.6 MG tablet Take 0.6-1.2 mg by mouth daily as needed (acute gout flare).     diclofenac Sodium (VOLTAREN) 1 % GEL Apply 2 g topically 4 (four) times daily. 50 g 1   fexofenadine (ALLEGRA) 180 MG tablet Take 180 mg by mouth daily as needed for allergies.     finasteride (PROSCAR) 5 MG tablet Take 1 tablet (5 mg total) by mouth daily. 90 tablet 3   fluticasone (FLONASE) 50 MCG/ACT nasal spray Place 2 sprays into both nostrils daily as needed for allergies.     folic acid (FOLVITE) 400 MCG tablet Take 400 mcg by mouth daily.     lamoTRIgine (LAMICTAL) 25 MG tablet Take 50 mg by mouth 2 (two) times daily.     pantoprazole (PROTONIX) 20 MG tablet Take 20 mg by mouth daily.     sertraline (ZOLOFT) 50 MG tablet Take 50 mg by mouth daily.     tamsulosin (FLOMAX) 0.4 MG CAPS capsule Take 0.4 mg by mouth daily.     No current facility-administered medications on file prior to visit.    There are no Patient Instructions on file for this visit. No follow-ups on file.   Georgiana Spinner, NP

## 2023-05-05 ENCOUNTER — Other Ambulatory Visit: Payer: Self-pay | Admitting: Physician Assistant

## 2023-05-05 ENCOUNTER — Ambulatory Visit
Admission: RE | Admit: 2023-05-05 | Discharge: 2023-05-05 | Disposition: A | Discharge status: Home / Self Care | Payer: Medicare Other | Source: Ambulatory Visit | Attending: Physician Assistant | Admitting: Physician Assistant

## 2023-05-05 DIAGNOSIS — L0291 Cutaneous abscess, unspecified: Secondary | ICD-10-CM | POA: Diagnosis present

## 2023-05-05 DIAGNOSIS — T148XXA Other injury of unspecified body region, initial encounter: Secondary | ICD-10-CM | POA: Insufficient documentation

## 2023-06-15 NOTE — Progress Notes (Deleted)
06/17/2023 7:58 AM   Troy Pugh 1941/01/14 [id number]  Referring provider: Marguarite Arbour, MD [address] [facility] [place],  [place] [address]  Urological history: 1. Prostate cancer -PSA (05/2023) <0.01 -s/p brachy seed (2012)   2. Urinary retention -?  Foley placement in ED without recorded residual -Tamsulosin 0.4 mg daily and finasteride 5 mg daily   No chief complaint on file.  HPI: Troy Pugh. is a 82 y.o. male who presents today for 82-month follow-up after with his wife, Troy Pugh.  Previous records reviewed.  Suffered broken ribs from a fall in October.    I PSS ***  PVR *** mL       Score:  1-7 Mild 8-19 Moderate 20-35 Severe   PMH: Past Medical History:  Diagnosis Date   Carotid stent occlusion (HCC)    High cholesterol    Hypertension    Stroke [facility])    Vascular dementia [facility])     Surgical History: Past Surgical History:  Procedure Laterality Date   CATARACT EXTRACTION     GALLBLADDER SURGERY      Home Medications:  Allergies as of 06/17/2023       Reactions   Other Hives   Sulfa Antibiotics Hives   Sulphadimidine [sulfamethazine]         Medication List        Accurate as of June 15, 2023  7:58 AM. If you have any questions, ask your nurse or doctor.          allopurinol 100 MG tablet Commonly known as: ZYLOPRIM Take 100 mg by mouth at bedtime.   atorvastatin 10 MG tablet Commonly known as: LIPITOR Take 10 mg by mouth daily.   Calcium Carb-Cholecalciferol 600-500 MG-UNIT Caps Take 1 tablet by mouth daily.   clopidogrel 75 MG tablet Commonly known as: PLAVIX Take 75 mg by mouth daily.   colchicine 0.6 MG tablet Take 0.6-1.2 mg by mouth daily as needed (acute gout flare).   diclofenac Sodium 1 % Gel Commonly known as: VOLTAREN Apply 2 g topically 4 (four) times daily.   fexofenadine 180 MG tablet Commonly known as: ALLEGRA Take 180 mg by mouth daily as  needed for allergies.   finasteride 5 MG tablet Commonly known as: Proscar Take 1 tablet (5 mg total) by mouth daily.   fluticasone 50 MCG/ACT nasal spray Commonly known as: FLONASE Place 2 sprays into both nostrils daily as needed for allergies.   folic acid 400 MCG tablet Commonly known as: FOLVITE Take 400 mcg by mouth daily.   lamoTRIgine 25 MG tablet Commonly known as: LAMICTAL Take 50 mg by mouth 2 (two) times daily.   pantoprazole 20 MG tablet Commonly known as: PROTONIX Take 20 mg by mouth daily.   sertraline 50 MG tablet Commonly known as: ZOLOFT Take 50 mg by mouth daily.   tamsulosin 0.4 MG Caps capsule Commonly known as: FLOMAX Take 0.4 mg by mouth daily.   Vitamin D 50 MCG (2000 UT) tablet Take 2,000 Units by mouth daily.        Allergies:  Allergies  Allergen Reactions   Other Hives   Sulfa Antibiotics Hives   Sulphadimidine [Sulfamethazine]     Family History: Family History  Problem Relation Age of Onset   Kidney failure Mother    Stroke Father     Social History:  reports that he has never smoked. He has never used smokeless tobacco. He reports current alcohol  use of about 3.0 standard drinks of alcohol per week. He reports that he does not use drugs.  ROS: Pertinent ROS in HPI  Physical Exam: There were no vitals taken for this visit.  Constitutional:  Well nourished. Alert and oriented, No acute distress. HEENT: [place] AT, moist mucus membranes.  Trachea midline, no masses. Cardiovascular: No clubbing, cyanosis, or edema. Respiratory: Normal respiratory effort, no increased work of breathing. GI: Abdomen is soft, non tender, non distended, no abdominal masses. Liver and spleen not palpable.  No hernias appreciated.  Stool sample for occult testing is not indicated.   GU: No CVA tenderness.  No bladder fullness or masses.  Patient with circumcised/uncircumcised phallus. ***Foreskin easily retracted***  Urethral meatus is patent.  No penile  discharge. No penile lesions or rashes. Scrotum without lesions, cysts, rashes and/or edema.  Testicles are located scrotally bilaterally. No masses are appreciated in the testicles. Left and right epididymis are normal. Rectal: Patient with  normal sphincter tone. Anus and perineum without scarring or rashes. No rectal masses are appreciated. Prostate is approximately *** grams, *** nodules are appreciated. Seminal vesicles are normal. Skin: No rashes, bruises or suspicious lesions. Lymph: No cervical or inguinal adenopathy. Neurologic: Grossly intact, no focal deficits, moving all 4 extremities. Psychiatric: Normal mood and affect.   Laboratory Data: CBC w/auto Differential (5 Part) Order: [id number] Component Ref Range & Units 11 d ago  WBC (White Blood Cell Count) 4.1 - 10.2 10^3/uL 5.7  RBC (Red Blood Cell Count) 4.69 - 6.13 10^6/uL 3.88 Low   Hemoglobin 14.1 - 18.1 gm/dL 65.7 Low   Hematocrit 84.6 - 52.0 % 37 Low   MCV (Mean Corpuscular Volume) 80.0 - 100.0 fl 95.4  MCH (Mean Corpuscular Hemoglobin) 27.0 - 31.2 pg 32 High   MCHC (Mean Corpuscular Hemoglobin Concentration) 32.0 - 36.0 gm/dL 96.2  Platelet Count 952 - 450 10^3/uL 194  RDW-CV (Red Cell Distribution Width) 11.6 - 14.8 % 12.8  MPV (Mean Platelet Volume) 9.4 - 12.4 fl 9.4  Neutrophils 1.50 - 7.80 10^3/uL 3.45  Lymphocytes 1.00 - 3.60 10^3/uL 1.18  Monocytes 0.00 - 1.50 10^3/uL 0.59  Eosinophils 0.00 - 0.55 10^3/uL 0.39  Basophils 0.00 - 0.09 10^3/uL 0.05  Neutrophil % 32.0 - 70.0 % 60.8  Lymphocyte % 10.0 - 50.0 % 20.8  Monocyte % 4.0 - 13.0 % 10.4  Eosinophil % 1.0 - 5.0 % 6.9 High   Basophil% 0.0 - 2.0 % 0.9  Immature Granulocyte % <=0.7 % 0.2  Immature Granulocyte Count <=0.06 10^3/L 0.01  Resulting Agency [facility]   Specimen Collected: 06/04/23 14:50   Performed by: [facility] Last Resulted: 06/04/23 15:55  Received From: [facility]   Result Received: 06/15/23 07:58   Comprehensive Metabolic Panel (CMP) Order: [id number] Component Ref Range & Units 11 d ago  Glucose 70 - 110 mg/dL 84  Sodium 027 - 253 mmol/L 139  Potassium 3.6 - 5.1 mmol/L 4.3  Chloride 97 - 109 mmol/L 103  Carbon Dioxide (CO2) 22.0 - 32.0 mmol/L 30.7  Urea Nitrogen (BUN) 7 - 25 mg/dL 18  Creatinine 0.7 - 1.3 mg/dL 1  Glomerular Filtration Rate (eGFR) >60 mL/min/1.73sq m 76  Comment: CKD-EPI (2021) does not include patient's race in the calculation of eGFR.  Monitoring changes of plasma creatinine and eGFR over time is useful for monitoring kidney function.  Interpretive Ranges for eGFR (CKD-EPI 2021):  eGFR:       >60 mL/min/1.73  sq. m - Normal eGFR:       30-59 mL/min/1.73 sq. m - Moderately Decreased eGFR:       15-29 mL/min/1.73 sq. m  - Severely Decreased eGFR:       < 15 mL/min/1.73 sq. m  - Kidney Failure   Note: These eGFR calculations do not apply in acute situations when eGFR is changing rapidly or patients on dialysis.  Calcium 8.7 - 10.3 mg/dL 8.9  AST 8 - 39 U/L 17  ALT 6 - 57 U/L 17  Alk Phos (alkaline Phosphatase) 34 - 104 U/L 70  Albumin 3.5 - 4.8 g/dL 3.9  Bilirubin, Total 0.3 - 1.2 mg/dL 0.3  Protein, Total 6.1 - 7.9 g/dL 5.6 Low   A/G Ratio 1.0 - 5.0 gm/dL 2.3  Resulting Agency [facility]   Specimen Collected: 06/04/23 14:50   Performed by: [facility] Last Resulted: 06/04/23 16:55  Received From: [facility]  Result Received: 06/15/23 07:58   PSA, Total (Screen) Order: [id number] Component Ref Range & Units 11 d ago  PSA (Prostate Specific Antigen), Total 0.10 - 4.00 ng/mL <0.01 Low   Resulting Agency [facility]  Narrative Performed by [facility] Test results were determined with Beck[employer] Coulter Hybritech Assay. Values obtained with different assay methods cannot be used interchangeably in serial testing. Assay results  should not be interpreted as absolute evidence of the presence or absence of malignant disease  Specimen Collected: 06/04/23 14:50   Performed by: [facility] Last Resulted: 06/04/23 16:55  Received From: [facility]  Result Received: 06/15/23 07:58   Urinalysis w/Microscopic Order: [id number] Component Ref Range & Units 4 mo ago  Color Colorless, Straw, Light Yellow, Yellow, Dark Yellow Light Yellow  Clarity Clear Clear  Specific Gravity 1.005 - 1.030 1.024  pH, Urine 5.0 - 8.0 5.5  Protein, Urinalysis Negative mg/dL Negative  Glucose, Urinalysis Negative mg/dL Negative  Ketones, Urinalysis Negative mg/dL Negative  Blood, Urinalysis Negative Negative  Nitrite, Urinalysis Negative Negative  Leukocyte Esterase, Urinalysis Negative Negative  Hyaline Casts, Urinalysis /lpf 1  Bilirubin, Urinalysis Negative Negative  Urobilinogen, Urinalysis 0.2 - 1.0 mg/dL 0.2  WBC, UA <=5 /hpf <1  Red Blood Cells, Urinalysis <=3 /hpf 0  Bacteria, Urinalysis 0 - 5 /hpf 0-5  Squamous Epithelial Cells, Urinalysis /hpf 0  Resulting Agency [facility]   Specimen Collected: 01/22/23 12:44   Performed by: [facility] Last Resulted: 01/22/23 14:13  Received From: [facility]  Result Received: 04/11/23 11:54  I have reviewed the labs.   Pertinent Imaging: ***   Assessment & Plan:    1. BPH with LUTS -PVR < 300 cc *** -continue conservative management, avoiding bladder irritants and timed voiding's -Continue tamsulosin 0.4 mg daily -Explained that the dizzy spells are more likely be due to the tamsulosin rather than the finasteride, I reminded him to restart the finasteride again and see if the dizzy spells return and if so stop the medication otherwise continue the finasteride till we see he in 6 months  2.  Urinary retention -resolved at this time -reviewed return precautions  No follow-ups on  file.  These notes generated with voice recognition software. I apologize for typographical errors.  Cloretta Ned  [facility] [address]  [address] [place], [place] [address] [phone]

## 2023-06-17 ENCOUNTER — Ambulatory Visit: Payer: Medicare Other | Admitting: Urology

## 2023-06-17 DIAGNOSIS — R339 Retention of urine, unspecified: Secondary | ICD-10-CM

## 2023-06-17 DIAGNOSIS — N138 Other obstructive and reflux uropathy: Secondary | ICD-10-CM

## 2023-07-11 NOTE — Progress Notes (Signed)
 07/15/2023 3:02 PM   Troy Pugh 31-May-1941 [id number]  Referring provider: Yehuda Helms, MD [address] [facility] [place],  [place] [address]  Urological history: 1. Prostate cancer -PSA (05/2023) <0.01 -s/p brachy seed (2012)   2. Urinary retention -?  Foley placement in ED without recorded residual -Tamsulosin  0.4 mg daily and finasteride  5 mg daily   Chief Complaint  Patient presents with   Urinary Retention   HPI: Troy Pugh. is a 83 y.o. male who presents today for 27-month follow-up after with his wife, Troy Pugh.  Previous records reviewed.  Suffered broken ribs from a fall in October.    He did not answer all the questions for the IPSS.  Some of his history is obtained from his wife.  He has no complaints.  His wife endorses some mild post void dribbling.  Patient denies any modifying or aggravating factors.  Patient denies any recent UTI's, gross hematuria, dysuria or suprapubic/flank pain.  Patient denies any fevers, chills, nausea or vomiting.    PVR 145 mL    IPSS     Row Name 07/15/23 1445         International Prostate Symptom Score   How often have you had to urinate less than every two hours? Not at All     How many times did you typically get up at night to urinate? 2 Times     Total IPSS Score 2       Quality of Life due to urinary symptoms   If you were to spend the rest of your life with your urinary condition just the way it is now how would you feel about that? Mostly Satisfied                Score:  1-7 Mild 8-19 Moderate 20-35 Severe   PMH: Past Medical History:  Diagnosis Date   Carotid stent occlusion (HCC)    High cholesterol    Hypertension    Stroke [facility])    Vascular dementia [facility])     Surgical History: Past Surgical History:  Procedure Laterality Date   CATARACT EXTRACTION     GALLBLADDER SURGERY      Home Medications:  Allergies as of 07/15/2023        Reactions   Other Hives   Sulfa Antibiotics Hives   Sulphadimidine [sulfamethazine]         Medication List        Accurate as of July 15, 2023  3:02 PM. If you have any questions, ask your nurse or doctor.          allopurinol  100 MG tablet Commonly known as: ZYLOPRIM  Take 100 mg by mouth at bedtime.   atorvastatin  10 MG tablet Commonly known as: LIPITOR Take 10 mg by mouth daily.   Calcium  Carb-Cholecalciferol 600-500 MG-UNIT Caps Take 1 tablet by mouth daily.   clopidogrel  75 MG tablet Commonly known as: PLAVIX  Take 75 mg by mouth daily.   colchicine 0.6 MG tablet Take 0.6-1.2 mg by mouth daily as needed (acute gout flare).   diclofenac  Sodium 1 % Gel Commonly known as: VOLTAREN  Apply 2 g topically 4 (four) times daily.   fexofenadine 180 MG tablet Commonly known as: ALLEGRA Take 180 mg by mouth daily as needed for allergies.   finasteride  5 MG tablet Commonly known as: Proscar  Take 1 tablet (5 mg total) by mouth daily.   fluticasone  50 MCG/ACT nasal spray Commonly  known as: FLONASE  Place 2 sprays into both nostrils daily as needed for allergies.   folic acid  400 MCG tablet Commonly known as: FOLVITE  Take 400 mcg by mouth daily.   lamoTRIgine  25 MG tablet Commonly known as: LAMICTAL  Take 50 mg by mouth 2 (two) times daily.   pantoprazole  20 MG tablet Commonly known as: PROTONIX  Take 20 mg by mouth daily.   sertraline  50 MG tablet Commonly known as: ZOLOFT  Take 50 mg by mouth daily.   tamsulosin  0.4 MG Caps capsule Commonly known as: FLOMAX  Take 0.4 mg by mouth daily.   Vitamin D 50 MCG (2000 UT) tablet Take 2,000 Units by mouth daily.        Allergies:  Allergies  Allergen Reactions   Other Hives   Sulfa Antibiotics Hives   Sulphadimidine [Sulfamethazine]     Family History: Family History  Problem Relation Age of Onset   Kidney failure Mother    Stroke Father     Social History:  reports that he has never smoked. He  has never used smokeless tobacco. He reports current alcohol use of about 3.0 standard drinks of alcohol per week. He reports that he does not use drugs.  ROS: Pertinent ROS in HPI  Physical Exam: BP 138/74   Pulse 76   Ht 5\' 9"  (1.753 m)   Wt 157 lb (71.2 kg)   BMI 23.18 kg/m   Constitutional:  Well nourished. Alert and oriented, No acute distress. HEENT: [place] AT, moist mucus membranes.  Trachea midline, no masses. Cardiovascular: No clubbing, cyanosis, or edema. Respiratory: Normal respiratory effort, no increased work of breathing. Neurologic: Grossly intact, no focal deficits, moving all 4 extremities. Psychiatric: Normal mood and affect.   Laboratory Data: CBC w/auto Differential (5 Part) Order: [id number] Component Ref Range & Units 11 d ago  WBC (White Blood Cell Count) 4.1 - 10.2 10^3/uL 5.7  RBC (Red Blood Cell Count) 4.69 - 6.13 10^6/uL 3.88 Low   Hemoglobin 14.1 - 18.1 gm/dL 29.5 Low   Hematocrit 28.4 - 52.0 % 37 Low   MCV (Mean Corpuscular Volume) 80.0 - 100.0 fl 95.4  MCH (Mean Corpuscular Hemoglobin) 27.0 - 31.2 pg 32 High   MCHC (Mean Corpuscular Hemoglobin Concentration) 32.0 - 36.0 gm/dL 13.2  Platelet Count 440 - 450 10^3/uL 194  RDW-CV (Red Cell Distribution Width) 11.6 - 14.8 % 12.8  MPV (Mean Platelet Volume) 9.4 - 12.4 fl 9.4  Neutrophils 1.50 - 7.80 10^3/uL 3.45  Lymphocytes 1.00 - 3.60 10^3/uL 1.18  Monocytes 0.00 - 1.50 10^3/uL 0.59  Eosinophils 0.00 - 0.55 10^3/uL 0.39  Basophils 0.00 - 0.09 10^3/uL 0.05  Neutrophil % 32.0 - 70.0 % 60.8  Lymphocyte % 10.0 - 50.0 % 20.8  Monocyte % 4.0 - 13.0 % 10.4  Eosinophil % 1.0 - 5.0 % 6.9 High   Basophil% 0.0 - 2.0 % 0.9  Immature Granulocyte % <=0.7 % 0.2  Immature Granulocyte Count <=0.06 10^3/L 0.01  Resulting Agency [facility]   Specimen Collected: 06/04/23 14:50   Performed by: [facility] Last Resulted: 06/04/23 15:55  Received From: [facility]  Result Received: 06/15/23 07:58   Comprehensive Metabolic Panel (CMP) Order: [id number] Component Ref Range & Units 11 d ago  Glucose 70 - 110 mg/dL 84  Sodium 440 - 347 mmol/L 139  Potassium 3.6 - 5.1 mmol/L 4.3  Chloride 97 - 109 mmol/L 103  Carbon Dioxide (CO2) 22.0 - 32.0 mmol/L 30.7  Urea Nitrogen (BUN) 7 - 25 mg/dL 18  Creatinine 0.7 - 1.3 mg/dL 1  Glomerular Filtration Rate (eGFR) >60 mL/min/1.73sq m 76  Comment: CKD-EPI (2021) does not include patient's race in the calculation of eGFR.  Monitoring changes of plasma creatinine and eGFR over time is useful for monitoring kidney function.  Interpretive Ranges for eGFR (CKD-EPI 2021):  eGFR:       >60 mL/min/1.73 sq. m - Normal eGFR:       30-59 mL/min/1.73 sq. m - Moderately Decreased eGFR:       15-29 mL/min/1.73 sq. m  - Severely Decreased eGFR:       < 15 mL/min/1.73 sq. m  - Kidney Failure   Note: These eGFR calculations do not apply in acute situations when eGFR is changing rapidly or patients on dialysis.  Calcium  8.7 - 10.3 mg/dL 8.9  AST 8 - 39 U/L 17  ALT 6 - 57 U/L 17  Alk Phos (alkaline Phosphatase) 34 - 104 U/L 70  Albumin 3.5 - 4.8 g/dL 3.9  Bilirubin, Total 0.3 - 1.2 mg/dL 0.3  Protein, Total 6.1 - 7.9 g/dL 5.6 Low   A/G Ratio 1.0 - 5.0 gm/dL 2.3  Resulting Agency [facility]   Specimen Collected: 06/04/23 14:50   Performed by: [facility] Last Resulted: 06/04/23 16:55  Received From: [facility]  Result Received: 06/15/23 07:58   PSA, Total (Screen) Order: [id number] Component Ref Range & Units 11 d ago  PSA (Prostate Specific Antigen), Total 0.10 - 4.00 ng/mL <0.01 Low   Resulting Agency [facility]  Narrative Performed by [facility] Test results were determined with Beckman Coulter Hybritech Assay. Values obtained with different assay methods cannot be used interchangeably in  serial testing. Assay results should not be interpreted as absolute evidence of the presence or absence of malignant disease  Specimen Collected: 06/04/23 14:50   Performed by: [facility] Last Resulted: 06/04/23 16:55  Received From: [facility]  Result Received: 06/15/23 07:58   Urinalysis w/Microscopic Order: [id number] Component Ref Range & Units 4 mo ago  Color Colorless, Straw, Light Yellow, Yellow, Dark Yellow Light Yellow  Clarity Clear Clear  Specific Gravity 1.005 - 1.030 1.024  pH, Urine 5.0 - 8.0 5.5  Protein, Urinalysis Negative mg/dL Negative  Glucose, Urinalysis Negative mg/dL Negative  Ketones, Urinalysis Negative mg/dL Negative  Blood, Urinalysis Negative Negative  Nitrite, Urinalysis Negative Negative  Leukocyte Esterase, Urinalysis Negative Negative  Hyaline Casts, Urinalysis /lpf 1  Bilirubin, Urinalysis Negative Negative  Urobilinogen, Urinalysis 0.2 - 1.0 mg/dL 0.2  WBC, UA <=5 /hpf <1  Red Blood Cells, Urinalysis <=3 /hpf 0  Bacteria, Urinalysis 0 - 5 /hpf 0-5  Squamous Epithelial Cells, Urinalysis /hpf 0  Resulting Agency [facility]   Specimen Collected: 01/22/23 12:44   Performed by: [facility] Last Resulted: 01/22/23 14:13  Received From: [facility]  Result Received: 04/11/23 11:54  I have reviewed the labs.   Pertinent Imaging: Component     Latest Ref Rng 07/15/2023  PVR     WU 145.0       Assessment & Plan:    1. BPH with LUTS -PVR < 300 cc  -continue conservative management, avoiding bladder irritants and timed voiding's -Continue finasteride  5 mg daily  2.  Prostate cancer -PSA remains undetectable   Return in about 1 year (  around 07/14/2024) for PSA, I PSS, PVR .  These notes generated with voice recognition software. I apologize for typographical errors.  Briant Camper  [facility] [address]  [address] [place], [place] [address] [phone]

## 2023-07-15 ENCOUNTER — Encounter: Payer: Self-pay | Admitting: Urology

## 2023-07-15 ENCOUNTER — Ambulatory Visit (INDEPENDENT_AMBULATORY_CARE_PROVIDER_SITE_OTHER): Payer: Medicare Other | Admitting: Urology

## 2023-07-15 VITALS — BP 138/74 | HR 76 | Ht 69.0 in | Wt 157.0 lb

## 2023-07-15 DIAGNOSIS — R339 Retention of urine, unspecified: Secondary | ICD-10-CM

## 2023-07-15 DIAGNOSIS — N138 Other obstructive and reflux uropathy: Secondary | ICD-10-CM

## 2023-07-15 DIAGNOSIS — N401 Enlarged prostate with lower urinary tract symptoms: Secondary | ICD-10-CM

## 2023-07-15 DIAGNOSIS — Z8546 Personal history of malignant neoplasm of prostate: Secondary | ICD-10-CM | POA: Diagnosis not present

## 2023-07-15 LAB — BLADDER SCAN AMB NON-IMAGING: PVR: 145 WU

## 2023-07-15 MED ORDER — FINASTERIDE 5 MG PO TABS: 5.0000 mg | ORAL_TABLET | Freq: Every day | ORAL | 3 refills | Status: DC

## 2023-08-04 ENCOUNTER — Ambulatory Visit: Payer: Medicare Other | Attending: Orthopedic Surgery | Admitting: Occupational Therapy

## 2023-08-04 DIAGNOSIS — M25642 Stiffness of left hand, not elsewhere classified: Secondary | ICD-10-CM

## 2023-08-04 DIAGNOSIS — M720 Palmar fascial fibromatosis [Dupuytren]: Secondary | ICD-10-CM | POA: Diagnosis present

## 2023-08-04 NOTE — Therapy (Signed)
 OUTPATIENT OCCUPATIONAL THERAPY ORTHO EVALUATION  Patient Name: Troy Pugh. MRN: [id number] DOB:09/15/1940, 83 y.o., male Today's Date: 08/04/2023  PCP: Dr Auston REFERRING PROVIDER: Dr Kathlynn  END OF SESSION:  OT End of Session - 08/04/23 1517     Visit Number 1    Number of Visits 6    Date for OT Re-Evaluation 09/15/23    OT Start Time 1409    OT Stop Time 1459    OT Time Calculation (min) 50 min    Activity Tolerance Patient tolerated treatment well    Behavior During Therapy [facility] for tasks assessed/performed             Past Medical History:  Diagnosis Date   Carotid stent occlusion (HCC)    High cholesterol    Hypertension    Stroke [facility])    Vascular dementia [facility])    Past Surgical History:  Procedure Laterality Date   CATARACT EXTRACTION     GALLBLADDER SURGERY     Patient Active Problem List   Diagnosis Date Noted   Sepsis (HCC) 01/24/2021   BPH (benign prostatic hyperplasia) 01/24/2021   GERD (gastroesophageal reflux disease) 01/24/2021   B12 deficiency 11/11/2019   Vascular dementia with behavior disturbance (HCC) 07/28/2019   Stroke (HCC) 01/27/2017   High cholesterol 01/27/2017   Hypertension 01/27/2017   Carotid stenosis 01/27/2017   Personal history of prostate cancer 10/17/2016   Nonrheumatic aortic valve stenosis 06/09/2016   Candida infection of genital region 09/07/2015   Decreased energy 01/04/2015   Benign essential hypertension 06/06/2014   Benign non-nodular prostatic hyperplasia with lower urinary tract symptoms 06/06/2014   Pure hypercholesterolemia 06/06/2014   Benign prostatic hyperplasia without lower urinary tract symptoms 01/20/2014   Gout 01/20/2014   Other and unspecified hyperlipidemia 01/20/2014   Cerebral infarction (HCC) 01/20/2014   Cerebrovascular disease 01/20/2014   Occlusion and stenosis of carotid artery with cerebral infarction 07/27/2013   Symptomatic carotid artery stenosis with infarction (HCC) 07/27/2013    Bladder neck obstruction 02/03/2013   Chronic kidney disease, stage III (moderate) (HCC) 08/11/2012   Incomplete emptying of bladder 08/11/2012   Malignant neoplasm of prostate (HCC) 08/11/2012   Retention of urine 08/11/2012    ONSET DATE:07/31/23  REFERRING DIAG: L dupuytrens release  THERAPY DIAG:  Dupuytren's contracture of left hand  Stiffness of left hand, not elsewhere classified  Rationale for Evaluation and Treatment: Rehabilitation  SUBJECTIVE:   SUBJECTIVE STATEMENT: Dr send my for splint - to be able to open my hand more -so I can grasp around railing at stairs- get hand in pocket- put on gloves and pick up objects  Pt accompanied by: self, family member, and care taker  PERTINENT HISTORY:  Ortho appt  07/31/23  Assessment: ICD-10-CM  1. Dupuytren's contracture M72.0   Plan:  The patient has clinical findings of status post of left ring and middle fingers percutaneous epineurotomy.  Partial improvement has been observed in the middle and ring fingers following the percutaneous epineurotomy, with no complications related to the wound, only expected ecchymosis. The splint was reapplied during this visit. Sitara Cashwell [place], OTR/L, CLT, the hand therapist, will assist with the application of a splint to maintain extension. He is advised to remove the splint for bathing and to use a new bandage after showering before reapplying the splint. He is instructed to rest his hand and push at the joint to stretch the fingers when out of the splint to give her skin a break.  Follow-up  The patient will follow up in 1.5 weeks.   PRECAUTIONS: no pushing thru palm all the time     WEIGHT BEARING RESTRICTIONS: No  PAIN:  Are you having pain? No  FALLS: Has patient fallen in last 6 months? No  LIVING ENVIRONMENT: Lives with: lives with their spouse Lives in: House/apartment  PLOF: read and play games - card games - on IPAD   PATIENT GOALS: able to open my hand  NEXT  MD VISIT: 12th Febr  OBJECTIVE:  Note: Objective measures were completed at Evaluation unless otherwise noted.  HAND DOMINANCE: Right  ADLs: Need assistance from caretaker    UPPER EXTREMITY ROM:     Active ROM Right eval Left eval  Shoulder flexion    Shoulder abduction    Shoulder adduction    Shoulder extension    Shoulder internal rotation    Shoulder external rotation    Elbow flexion    Elbow extension    Wrist flexion  WFL  Wrist extension  WFL  Wrist ulnar deviation  WFL  Wrist radial deviation  WFL  Wrist pronation  WNL  Wrist supination  WNL  (Blank rows = not tested)  Active ROM Right eval Left eval  Thumb MCP (0-60)    Thumb IP (0-80)    Thumb Radial abd/add (0-55)     Thumb Palmar abd/add (0-45)     Thumb Opposition to Small Finger     Index MCP (0-90)   -40 ext/ PROM -30  Index PIP (0-100)     Index DIP (0-70)      Long MCP (0-90)   -55 ext /PROM -40  Long PIP (0-100)   Hyper ext   Long DIP (0-70)      Ring MCP (0-90)   -65 ext / PROM -55  Ring PIP (0-100)   Hyper ext   Ring DIP (0-70)      Little MCP (0-90)   -65 ext /PROM -50 ext  Little PIP (0-100)   Hyper ext   Little DIP (0-70)      (Blank rows = not tested)   UPPER EXTREMITY MMT:    HAND FUNCTION: Grip strength: Right:   lbs; Left:   lbs, Lateral pinch: Right:   lbs, Left:   lbs, and 3 point pinch: Right:   lbs, Left:   lbs  COORDINATION: Impaired in L   SENSATION: Denies issues  EDEMA: present - ed on doing contrast at home   COGNITION: Overall cognitive status: Impaired      TREATMENT DATE: 08/04/23                                                                                                                            Patient arrived with bandages in place.  Removed and skin check done.  Band-Aid in place. Fabricated a volar resting hand splint with digits in -30 to -50 extension at the MCPs. Moleskin applied for cushioning Educated family and  caretaker on donning  and doffing and wearing appropriately. Review precautionsf or straps as well as switch to Tubigrip D for light compression under straps.  Reviewed with patient because of decreased cognition needed few times review light MC extension.  Sliding on bandages to facilitate active assisted range of motion 5 times a day after contrast keeping at a slight pull with light pressure behind metacarpals  Reviewed with caretaker and wife splint as well as range of motion for [facility] extension. 5 times a day.       GOALS: Goals reviewed with patient? Yes  SHORT TERM GOALS: Target date: 2 wks  Patient and family plus caretaker be independent in home program for wearing splint and doing digit extension. Baseline: No knowledge of splint wearing as well as home exercises-increase edema because of Ace wrap coming in Goal status: INITIAL  LONG TERM GOALS: Target date:  6 wks  Patient and caretakers to be independent to maintain progress gains and digit extension with procedure Baseline: -40 to -65 active range of motion at metacarpals.;  Passive range of motion -30 to -50 Goal status: INITIAL  2.  Patient able to use left hand functionally for grasping around cylinder objects as well as release with no increase symptoms Baseline: Patient had a flexor contracture was unable to grasp around railing or objects at home during ADLs. Goal status: INITIAL Baseline:  Goal status: INITIAL   ASSESSMENT:  CLINICAL IMPRESSION: Patient seen today for occupational therapy evaluation for left dupuytren  contracture - had percutaneous epineurotomy done by Dr Kathlynn on 07/29/23 to left fourth and third digits.  Patient arrived with prefab soft cast with Ace wrap and Band-Aid in place.  Was removed don Tubigrip D for light compression for edema and family was educated in doing contrast.  Fabricated patient a volar hand-based custom digit extension splint to wear as much as he can until next appointment with MD.  To take  off 5 times a day for light passive range of motion.  Patient limited in functional use of left hand for grasp and release of objects during ADLs.  Prior to this a flexion contracture.  Patient can benefit from skilled OT services to maintain progress again in range of motion with procedure as well as increase functional use in ADLs.  PERFORMANCE DEFICITS: in functional skills including ADLs, IADLs, ROM, strength, pain, flexibility, decreased knowledge of use of DME, and UE functional use,   and psychosocial skills including environmental adaptation and routines and behaviors.   IMPAIRMENTS: are limiting patient from ADLs, IADLs, rest and sleep, play, leisure, and social participation.   COMORBIDITIES: has no other co-morbidities that affects occupational performance. Patient will benefit from skilled OT to address above impairments and improve overall function.  MODIFICATION OR ASSISTANCE TO COMPLETE EVALUATION: No modification of tasks or assist necessary to complete an evaluation.  OT OCCUPATIONAL PROFILE AND HISTORY: Problem focused assessment: Including review of records relating to presenting problem.  CLINICAL DECISION MAKING: LOW - limited treatment options, no task modification necessary  REHAB POTENTIAL: Good for goals  EVALUATION COMPLEXITY: Low       PLAN:  OT FREQUENCY: 1x/week  OT DURATION: 6 weeks  PLANNED INTERVENTIONS: 97535 self care/ADL training, 02889 therapeutic exercise, 97140 manual therapy, 97018 paraffin, 02960 fluidotherapy, 97034 contrast bath, 97760 Orthotics management and training, passive range of motion, patient/family education, and DME and/or AE instructions    CONSULTED AND AGREED WITH PLAN OF CARE: Patient and family member/caregiver     Nezar Buckles,  [place], OTR/L,CLT 08/04/2023, 3:19 PM

## 2023-08-17 ENCOUNTER — Ambulatory Visit: Payer: Medicare Other | Admitting: Occupational Therapy

## 2023-08-17 DIAGNOSIS — M25642 Stiffness of left hand, not elsewhere classified: Secondary | ICD-10-CM

## 2023-08-17 DIAGNOSIS — M720 Palmar fascial fibromatosis [Dupuytren]: Secondary | ICD-10-CM | POA: Diagnosis not present

## 2023-08-17 NOTE — Therapy (Signed)
OUTPATIENT OCCUPATIONAL THERAPY ORTHO TREATMENT  Patient Name: Troy Pugh. MRN: [id number] DOB:06-13-1941, 83 y.o., male Today's Date: 08/17/2023  PCP: Dr Judithann Sheen REFERRING PROVIDER: Dr Rosita Kea  END OF SESSION:  OT End of Session - 08/17/23 1314     Visit Number 2    Number of Visits 6    Date for OT Re-Evaluation 09/15/23    OT Start Time 1116    OT Stop Time 1208    OT Time Calculation (min) 52 min    Activity Tolerance Patient tolerated treatment well    Behavior During Therapy [facility] for tasks assessed/performed             Past Medical History:  Diagnosis Date   Carotid stent occlusion (HCC)    High cholesterol    Hypertension    Stroke [facility])    Vascular dementia [facility])    Past Surgical History:  Procedure Laterality Date   CATARACT EXTRACTION     GALLBLADDER SURGERY     Patient Active Problem List   Diagnosis Date Noted   Sepsis (HCC) 01/24/2021   BPH (benign prostatic hyperplasia) 01/24/2021   GERD (gastroesophageal reflux disease) 01/24/2021   B12 deficiency 11/11/2019   Vascular dementia with behavior disturbance (HCC) 07/28/2019   Stroke (HCC) 01/27/2017   High cholesterol 01/27/2017   Hypertension 01/27/2017   Carotid stenosis 01/27/2017   Personal history of prostate cancer 10/17/2016   Nonrheumatic aortic valve stenosis 06/09/2016   Candida infection of genital region 09/07/2015   Decreased energy 01/04/2015   Benign essential hypertension 06/06/2014   Benign non-nodular prostatic hyperplasia with lower urinary tract symptoms 06/06/2014   Pure hypercholesterolemia 06/06/2014   Benign prostatic hyperplasia without lower urinary tract symptoms 01/20/2014   Gout 01/20/2014   Other and unspecified hyperlipidemia 01/20/2014   Cerebral infarction (HCC) 01/20/2014   Cerebrovascular disease 01/20/2014   Occlusion and stenosis of carotid artery with cerebral infarction 07/27/2013   Symptomatic carotid artery stenosis with infarction (HCC) 07/27/2013    Bladder neck obstruction 02/03/2013   Chronic kidney disease, stage III (moderate) (HCC) 08/11/2012   Incomplete emptying of bladder 08/11/2012   Malignant neoplasm of prostate (HCC) 08/11/2012   Retention of urine 08/11/2012    ONSET DATE:07/31/23  REFERRING DIAG: L dupuytrens release  THERAPY DIAG:  Dupuytren's contracture of left hand  Stiffness of left hand, not elsewhere classified  Rationale for Evaluation and Treatment: Rehabilitation  SUBJECTIVE:   SUBJECTIVE STATEMENT: Dr send my for splint - to be able to open my hand more -so I can grasp around railing at stairs- get hand in pocket- put on gloves and pick up objects  Pt accompanied by: self, family member, and care taker  PERTINENT HISTORY:  Ortho appt  07/31/23  Assessment: ICD-10-CM  1. Dupuytren's contracture M72.0   Plan:  The patient has clinical findings of status post of left ring and middle fingers percutaneous epineurotomy.  Partial improvement has been observed in the middle and ring fingers following the percutaneous epineurotomy, with no complications related to the wound, only expected ecchymosis. The splint was reapplied during this visit. Elizaveta Mattice [place], OTR/L, CLT, the hand therapist, will assist with the application of a splint to maintain extension. He is advised to remove the splint for bathing and to use a new bandage after showering before reapplying the splint. He is instructed to rest his hand and push at the joint to stretch the fingers when out of the splint to give her skin a break.  Follow-up  The patient will follow up in 1.5 weeks.   PRECAUTIONS: no pushing thru palm all the time     WEIGHT BEARING RESTRICTIONS: No  PAIN:  Are you having pain? No  FALLS: Has patient fallen in last 6 months? No  LIVING ENVIRONMENT: Lives with: lives with their spouse Lives in: House/apartment  PLOF: read and play games - card games - on IPAD   PATIENT GOALS: able to open my hand  NEXT  MD VISIT: 12th Febr  OBJECTIVE:  Note: Objective measures were completed at Evaluation unless otherwise noted.  HAND DOMINANCE: Right  ADLs: Need assistance from caretaker    UPPER EXTREMITY ROM:     Active ROM Right eval Left eval  Shoulder flexion    Shoulder abduction    Shoulder adduction    Shoulder extension    Shoulder internal rotation    Shoulder external rotation    Elbow flexion    Elbow extension    Wrist flexion  WFL  Wrist extension  WFL  Wrist ulnar deviation  WFL  Wrist radial deviation  WFL  Wrist pronation  WNL  Wrist supination  WNL  (Blank rows = not tested)  Active ROM Right eval Left eval L 08/17/23  Thumb MCP (0-60)     Thumb IP (0-80)     Thumb Radial abd/add (0-55)      Thumb Palmar abd/add (0-45)      Thumb Opposition to Small Finger      Index MCP (0-90)   -40 ext/ PROM -30 -40 ext  Index PIP (0-100)      Index DIP (0-70)       Long MCP (0-90)   -55 ext /PROM -40 -40 ext  Long PIP (0-100)   Hyper ext    Long DIP (0-70)       Ring MCP (0-90)   -65 ext / PROM -55 -55 ext  Ring PIP (0-100)   Hyper ext    Ring DIP (0-70)       Little MCP (0-90)   -65 ext /PROM -50 ext -55 ext  Little PIP (0-100)   Hyper ext    Little DIP (0-70)       (Blank rows = not tested)   UPPER EXTREMITY MMT:    HAND FUNCTION: Grip strength: Right:   lbs; Left:   lbs, Lateral pinch: Right:   lbs, Left:   lbs, and 3 point pinch: Right:   lbs, Left:   lbs  COORDINATION: Impaired in L   SENSATION: Denies issues  EDEMA: present - ed on doing contrast at home   COGNITION: Overall cognitive status: Impaired      TREATMENT DATE: 08/17/23                                                                                                                            Patient and wife arrive with right extension splint in place. Appear proximal part of brace wrapped  on volar wrist. Appear patient had been using it more during the day and not at  nighttime. Patient digit extension at MCP improved compared to evaluation. Patient's edema decreased since last time and brace is slipping off per wife. Refabricate new extension splint for digit with a gutter component on ulnar side of hand. Hand-based made to decrease any rubbing on volar wrist Also patient to wear at nighttime.  And reviewed with patient and husband exercises to be done up to 5 times a day. Rolling over foam roller for digit extension active assisted range of motion Grasping around large foam grip and opening the release. 20 reps each Having several size cylinder objects for patient to grasp and release facilitating more digit extension than a tight fist. Can do rolling over red roller several times during these activities.      GOALS: Goals reviewed with patient? Yes  SHORT TERM GOALS: Target date: 2 wks  Patient and family plus caretaker be independent in home program for wearing splint and doing digit extension. Baseline: No knowledge of splint wearing as well as home exercises-increase edema because of Ace wrap coming in Goal status: INITIAL  LONG TERM GOALS: Target date:  6 wks  Patient and caretakers to be independent to maintain progress gains and digit extension with procedure Baseline: -40 to -65 active range of motion at metacarpals.;  Passive range of motion -30 to -50 Goal status: INITIAL  2.  Patient able to use left hand functionally for grasping around cylinder objects as well as release with no increase symptoms Baseline: Patient had a flexor contracture was unable to grasp around railing or objects at home during ADLs. Goal status: INITIAL Baseline:  Goal status: INITIAL   ASSESSMENT:  CLINICAL IMPRESSION: Patient seen for left dupuytren  contracture - had percutaneous epineurotomy done by Dr Rosita Kea on 07/29/23 to left fourth and third digits.  Patient arrived with volar extension hand splint in place.  Appeared to be sliding because of  decrease edema.  Refabricated new digit extension splint with a gutter component that hand-based to wear at nighttime.  Digit extension to [facility] improved since evaluation.  Reviewed with patient and wife exercises as well as therapeutic activities to do up to 5 times a day to facilitate digit extension but also grasp and release.   Patient able to grasp and release cylinder objects of 2 to 4 cm.  Patient limited in functional use of left hand for grasp and release of objects during ADLs.  Prior to this a flexion contracture.  Patient can benefit from skilled OT services to maintain progress again in range of motion with procedure as well as increase functional use in ADLs.  PERFORMANCE DEFICITS: in functional skills including ADLs, IADLs, ROM, strength, pain, flexibility, decreased knowledge of use of DME, and UE functional use,   and psychosocial skills including environmental adaptation and routines and behaviors.   IMPAIRMENTS: are limiting patient from ADLs, IADLs, rest and sleep, play, leisure, and social participation.   COMORBIDITIES: has no other co-morbidities that affects occupational performance. Patient will benefit from skilled OT to address above impairments and improve overall function.  MODIFICATION OR ASSISTANCE TO COMPLETE EVALUATION: No modification of tasks or assist necessary to complete an evaluation.  OT OCCUPATIONAL PROFILE AND HISTORY: Problem focused assessment: Including review of records relating to presenting problem.  CLINICAL DECISION MAKING: LOW - limited treatment options, no task modification necessary  REHAB POTENTIAL: Good for goals  EVALUATION COMPLEXITY: Low  PLAN:  OT FREQUENCY: 1x/week  OT DURATION: 6 weeks  PLANNED INTERVENTIONS: 97535 self care/ADL training, 96045 therapeutic exercise, 97140 manual therapy, 97018 paraffin, 40981 fluidotherapy, 97034 contrast bath, 97760 Orthotics management and training, passive range of motion, patient/family  education, and DME and/or AE instructions    CONSULTED AND AGREED WITH PLAN OF CARE: Patient and family member/caregiver     Oletta Cohn, OTR/L,CLT 08/17/2023, 1:14 PM

## 2023-08-24 ENCOUNTER — Ambulatory Visit: Payer: Medicare Other | Admitting: Occupational Therapy

## 2023-08-24 DIAGNOSIS — M25642 Stiffness of left hand, not elsewhere classified: Secondary | ICD-10-CM

## 2023-08-24 DIAGNOSIS — M720 Palmar fascial fibromatosis [Dupuytren]: Secondary | ICD-10-CM | POA: Diagnosis not present

## 2023-08-24 NOTE — Therapy (Signed)
 OUTPATIENT OCCUPATIONAL THERAPY ORTHO TREATMENT  Patient Name: Troy Pugh. MRN: [id number] DOB:08-Apr-1941, 83 y.o., male Today's Date: 08/24/2023  PCP: Dr Payton Emerald PROVIDER: Dr Rosita Kea  END OF SESSION:  OT End of Session - 08/24/23 1246     Visit Number 3    Number of Visits 6    Date for OT Re-Evaluation 09/15/23    OT Start Time 1201    OT Stop Time 1244    OT Time Calculation (min) 43 min    Activity Tolerance Patient tolerated treatment well    Behavior During Therapy [facility] for tasks assessed/performed             Past Medical History:  Diagnosis Date   Carotid stent occlusion (HCC)    High cholesterol    Hypertension    Stroke [facility])    Vascular dementia [facility])    Past Surgical History:  Procedure Laterality Date   CATARACT EXTRACTION     GALLBLADDER SURGERY     Patient Active Problem List   Diagnosis Date Noted   Sepsis (HCC) 01/24/2021   BPH (benign prostatic hyperplasia) 01/24/2021   GERD (gastroesophageal reflux disease) 01/24/2021   B12 deficiency 11/11/2019   Vascular dementia with behavior disturbance (HCC) 07/28/2019   Stroke (HCC) 01/27/2017   High cholesterol 01/27/2017   Hypertension 01/27/2017   Carotid stenosis 01/27/2017   Personal history of prostate cancer 10/17/2016   Nonrheumatic aortic valve stenosis 06/09/2016   Candida infection of genital region 09/07/2015   Decreased energy 01/04/2015   Benign essential hypertension 06/06/2014   Benign non-nodular prostatic hyperplasia with lower urinary tract symptoms 06/06/2014   Pure hypercholesterolemia 06/06/2014   Benign prostatic hyperplasia without lower urinary tract symptoms 01/20/2014   Gout 01/20/2014   Other and unspecified hyperlipidemia 01/20/2014   Cerebral infarction (HCC) 01/20/2014   Cerebrovascular disease 01/20/2014   Occlusion and stenosis of carotid artery with cerebral infarction 07/27/2013   Symptomatic carotid artery stenosis with infarction (HCC) 07/27/2013    Bladder neck obstruction 02/03/2013   Chronic kidney disease, stage III (moderate) (HCC) 08/11/2012   Incomplete emptying of bladder 08/11/2012   Malignant neoplasm of prostate (HCC) 08/11/2012   Retention of urine 08/11/2012    ONSET DATE:07/31/23  REFERRING DIAG: L dupuytrens release  THERAPY DIAG:  Dupuytren's contracture of left hand  Stiffness of left hand, not elsewhere classified  Rationale for Evaluation and Treatment: Rehabilitation  SUBJECTIVE:   SUBJECTIVE STATEMENT: Doing need to sleep with my splint.  Did do Troy exercises.  Can grasp and release things easier.  As well as laying my hand on Troy railing Pt accompanied by: self, family member, and care taker  PERTINENT HISTORY:  Ortho appt  07/31/23  Assessment: ICD-10-CM  1. Dupuytren's contracture M72.0   Plan:  Troy patient has clinical findings of status post of left ring and middle fingers percutaneous epineurotomy.  Partial improvement has been observed in Troy middle and ring fingers following Troy percutaneous epineurotomy, with no complications related to Troy wound, only expected ecchymosis. Troy splint was reapplied during this visit. Troy Pugh, OTR/L, CLT, Troy Pugh, will assist with Troy application of a splint to maintain extension. He is advised to remove Troy splint for bathing and to use a new bandage after showering before reapplying Troy splint. He is instructed to rest his hand and push at Troy joint to stretch Troy fingers when out of Troy splint to give her skin a break.  Follow-up Troy patient will follow up  in 1.5 weeks.   PRECAUTIONS: no pushing thru palm all Troy time     WEIGHT BEARING RESTRICTIONS: No  PAIN:  Are you having pain? No  FALLS: Has patient fallen in last 6 months? No  LIVING ENVIRONMENT: Lives with: lives with their spouse Lives in: House/apartment  PLOF: read and play games - card games - on IPAD   PATIENT GOALS: able to open my hand  NEXT MD VISIT:  12th Febr  OBJECTIVE:  Note: Objective measures were completed at Evaluation unless otherwise noted.  HAND DOMINANCE: Right  ADLs: Need assistance from caretaker    UPPER EXTREMITY ROM:     Active ROM Right eval Left eval  Shoulder flexion    Shoulder abduction    Shoulder adduction    Shoulder extension    Shoulder internal rotation    Shoulder external rotation    Elbow flexion    Elbow extension    Wrist flexion  WFL  Wrist extension  WFL  Wrist ulnar deviation  WFL  Wrist radial deviation  WFL  Wrist pronation  WNL  Wrist supination  WNL  (Blank rows = not tested)  Active ROM Right eval Left eval L 08/17/23 L 08/24/23  Thumb MCP (0-60)      Thumb IP (0-80)      Thumb Radial abd/add (0-55)       Thumb Palmar abd/add (0-45)       Thumb Opposition to Small Finger       Index MCP (0-90)   -40 ext/ PROM -30 -40 ext -30 ext   Index PIP (0-100)       Index DIP (0-70)        Long MCP (0-90)   -55 ext /PROM -40 -40 ext -35 ext   Long PIP (0-100)   Hyper ext     Long DIP (0-70)        Ring MCP (0-90)   -65 ext / PROM -55 -55 ext -55 ext  Ring PIP (0-100)   Hyper ext     Ring DIP (0-70)        Little MCP (0-90)   -65 ext /PROM -50 ext -55 ext -55 ext  Little PIP (0-100)   Hyper ext     Little DIP (0-70)        (Blank rows = not tested)   UPPER EXTREMITY MMT:    HAND FUNCTION:  08/24/23 Grip strength: Right: 48 lbs; Left: 30 lbs, Lateral pinch: Right: 15 lbs, Left: 9 lbs, and 3 point pinch: Right: 11 lbs, Left: 8 lbs  COORDINATION: Impaired in L   SENSATION: Denies issues  EDEMA: non notic  COGNITION: Overall cognitive status: Impaired      TREATMENT DATE: 08/24/23     Done functional activities with patient pulling and pushing heavy front door open and close as well as turning doorknob.  Was able to do it fifth digit do want to float at times out to Troy side but cannot engage it v/c  Report Troy patient able to lay his hand on Troy railing going up  and down stairs Remind patient when sitting watching TV resting hand in open position during Troy day.  Patient and wife arrive with right extension splint off Reports patient has not been wearing it Troy last night or 2. Reinforced with patient Troy importance of wearing it for Troy next 4 to 8 weeks at nighttime to maintain progress and extension. Off  during Troy day for functional use as well as functional strengthening.  Patient digit extension at MCP improved  again in second and third digits.  And was able to maintain progress gained last time with fourth and fifth digit MCP extension  Last visit Refabricate new extension splint for digit with a gutter component on ulnar side of hand. Staying better in place.  Also patient to wear at nighttime.  And reviewed with patient and wife exercises to be done up to 4- 5 times a day. Rolling over foam roller for digit extension active assisted range of motion Grasping around large foam grip and opening Troy release. 20 reps each Having several size cylinder objects for patient to grasp and release facilitating more digit extension than a tight fist. Can do rolling over red roller several times during these activities. Add rubber bands to do place and hold for digit extension 2 sets of 12 reps 5 times a day  Attempted pulling and pushing putty as well as table slide but patient engaged flexors too much.  Grip and prehension strength assessed see flowsheet.    GOALS: Goals reviewed with patient? Yes  SHORT TERM GOALS: Target date: 2 wks  Patient and family plus caretaker be independent in home program for wearing splint and doing digit extension. Baseline: No knowledge of splint wearing as well as home exercises-increase edema because of Ace wrap coming in Goal status:MET  LONG TERM GOALS: Target date:  6 wks  Patient and  caretakers to be independent to maintain progress gains and digit extension with procedure Baseline: -40 to -65 active range of motion at metacarpals.;  Passive range of motion -30 to -50 Goal status: PROGRESSING  2.  Patient able to use left hand functionally for grasping around cylinder objects as well as release with no increase symptoms Baseline: Patient had a flexor contracture was unable to grasp around railing or objects at home during ADLs. Goal status:Progressing    ASSESSMENT:  CLINICAL IMPRESSION: Patient seen for left dupuytren  contracture - had percutaneous epineurotomy done by Dr Rosita Kea on 07/29/23 to left fourth and third digits.  Patient arrived last 2 visits with increased left digit MC extension.  Showed again increase extension at second and third.  Was able to maintain fourth of fifth.  Reinforced with patient and wife for patient to continue to wear digit extension splint with a gutter component  at nighttime for Troy next 4 to 8 weeks.   Reviewed with patient and wife exercises as well as therapeutic activities to do up to 5 times a day to facilitate digit extension but also grasp and release/in functional strengthening.  Patient able to state to push and pull heavy door as well as turn doorknob.  Patient to follow-up in 4 weeks to assess if able to maintain progress.  Patient limited in functional use of left hand for grasp and release of objects during ADLs.  Prior to this a flexion contracture.  Patient can benefit from skilled OT services to maintain progress again in range of motion with procedure as well as increase functional use in ADLs.  PERFORMANCE DEFICITS: in functional skills including ADLs, IADLs, ROM, strength, pain, flexibility, decreased knowledge of use of DME, and UE functional  use,   and psychosocial skills including environmental adaptation and routines and behaviors.   IMPAIRMENTS: are limiting patient from ADLs, IADLs, rest and sleep, play, leisure, and  social participation.   COMORBIDITIES: has no other co-morbidities that affects occupational performance. Patient will benefit from skilled OT to address above impairments and improve overall function.  MODIFICATION OR ASSISTANCE TO COMPLETE EVALUATION: No modification of tasks or assist necessary to complete an evaluation.  OT OCCUPATIONAL PROFILE AND HISTORY: Problem focused assessment: Including review of records relating to presenting problem.  CLINICAL DECISION MAKING: LOW - limited treatment options, no task modification necessary  REHAB POTENTIAL: Good for goals  EVALUATION COMPLEXITY: Low       PLAN:  OT FREQUENCY: 1 x month  OT DURATION: 6 weeks  PLANNED INTERVENTIONS: 97535 self care/ADL training, 16109 therapeutic exercise, 97140 manual therapy, 97018 paraffin, 60454 fluidotherapy, 97034 contrast bath, 97760 Orthotics management and training, passive range of motion, patient/family education, and DME and/or AE instructions    CONSULTED AND AGREED WITH PLAN OF CARE: Patient and family member/caregiver     Oletta Cohn, OTR/L,CLT 08/24/2023, 1:00 PM

## 2023-11-17 ENCOUNTER — Encounter (INDEPENDENT_AMBULATORY_CARE_PROVIDER_SITE_OTHER): Payer: Self-pay

## 2024-01-26 ENCOUNTER — Other Ambulatory Visit: Payer: Self-pay | Admitting: Gastroenterology

## 2024-01-26 DIAGNOSIS — F0150 Vascular dementia without behavioral disturbance: Secondary | ICD-10-CM

## 2024-01-26 DIAGNOSIS — R1314 Dysphagia, pharyngoesophageal phase: Secondary | ICD-10-CM

## 2024-01-26 DIAGNOSIS — Z8673 Personal history of transient ischemic attack (TIA), and cerebral infarction without residual deficits: Secondary | ICD-10-CM

## 2024-01-26 DIAGNOSIS — K117 Disturbances of salivary secretion: Secondary | ICD-10-CM

## 2024-02-02 ENCOUNTER — Ambulatory Visit
Admission: RE | Admit: 2024-02-02 | Discharge: 2024-02-02 | Disposition: A | Discharge status: Home / Self Care | Source: Ambulatory Visit | Attending: Gastroenterology | Admitting: Gastroenterology

## 2024-02-02 DIAGNOSIS — Z8673 Personal history of transient ischemic attack (TIA), and cerebral infarction without residual deficits: Secondary | ICD-10-CM | POA: Diagnosis present

## 2024-02-02 DIAGNOSIS — F0150 Vascular dementia without behavioral disturbance: Secondary | ICD-10-CM | POA: Diagnosis present

## 2024-02-02 DIAGNOSIS — R1314 Dysphagia, pharyngoesophageal phase: Secondary | ICD-10-CM | POA: Diagnosis present

## 2024-02-02 DIAGNOSIS — K117 Disturbances of salivary secretion: Secondary | ICD-10-CM | POA: Diagnosis present

## 2024-02-02 NOTE — Progress Notes (Signed)
 Modified Barium Swallow Study  Patient Details  Name: Troy Pugh. MRN: [id number] Date of Birth: Jun 08, 1941  Today's Date: 02/02/2024  Modified Barium Swallow completed.  Full report located under Chart Review in the Imaging Section.  History of Present Illness Pt is an 83 yo male w/ PMH including: Vascular Dementia w/ behavioral disturbance, Cerebral infarction, Cerebrovascular disease, GERD, HTN, BPH w/ urine retention, weakness, Falls, weight loss.  Per GI and Neurology Chart Notes: pt has Vascular Dementia with behavior disturbance based on neurodegenerative disease progression. Previously tried Aricept and Exelon .; Falls at home; Weight loss.. Neurology had recommended softer foods to mitigate choking risk in setting of Dementia s/p Family c/o pt choking on foods more at meals.  GI referral was had d/t excessive salivation and drooling. Wife reports he seems to hold food in his mouth and finds it hard to swallow food due to all of the excessive salivation. Neurology has not prescribed him any medication for sialorrhea due to history of BPH with bladder outlet obstruction. There was also a discussion with submandibular botox injections which patient declined. He denies any clear esophageal dysphagia. He does not feel like food or liquid is getting caught or hung in his chest when he is swallowing. He has difficulty at level of back of his throat and just above cricoid cartilage where excessive salivation and drooling leads to choking episodes. Wife has been trying to keep a soft food diet. He has been placed on Protonix  20 mg daily for potential underlying GERD. She hasn't noticed a significant difference since being on the Protonix . Patient denies any clear acid regurgitation or retrosternal burning. There is no odynophagia, early satiety, nausea, vomiting, or epigastric abdominal pain..    OF NOTE: Wife and Caregiver endorse pt eats Fast and puts Large Bites in his mouth- too much  food sometimes. Suspect this is related to the Cognitive decline/Dementia. Discussed that more Supervision is needed at meals to reduce impulsive eating behaviors. Wife agreed. Report of weight loss; reduced oral intake.    Clinical Impression Patient appears to present w/ mild-moderate, Chronic oropharyngeal phase dysphagia w/ contributing factors of Cognitive deficits(Dementia w/ behavioral disturbance), generalized weakness/deconditioning, advanced aging, and sensory deficits. These issues can increase risk for dysphagia/aspiration as well as decreased oral intake to sufficiently meet nutrition/hydration needs.   Oral phase is c/b mild lingual weakness and min disorganized lingual transport w/ palate residue post-swallow. Pt utilized a f/u, Dry swallow to clear oral cavity b/t boluses. Swallow initiation was delayed to the level of the posterior surface of the epiglottis(spilling from the valleculae) w/ liquids; at the valleculae w/ increased consistencies.  Pharyngeal phase is noted for mildly decreased base of tongue retraction and hyolaryngeal excursion leading to min-mod residue collection primarily in the valleculae, moreso w/ solids. Shallow laryngeal penetration occured x2 with thin liquids(trace coating along the underside of the epiglottis); no penetration with any other consistency. Subsequent f/u, Dry swallow w/ boluses was fairly effective in reducing valleculae residue; alternating w/ sip of liquid aided also. OF NOTE: aspiration (w/ immediate cough) w/ thin liquid sip occured 1x at End of study when taking one more sip during liquid wash strategy- pt took a Larger sip (w/ impaired timing) unfortunately.  Due to cognitive deficits, pt had a min difficult time w/ follow through w/ instructions- inconsistent. Amplitude and duration of CP segment was [facility] w/ no residue noted in/at the UES nor in the viewable cervical esophagus. Drooling was Not noted during the po trials/boluses given-  the  f/u, Dry swallow strategy was implemented during the exam.   Reviewed study findings with patient's Wife and Caregiver present after the study; showed images to pt. Gave explanation on the study and the impact of Dementia on self-awareness and self-correction as well as the impact on swallowing. Education was had w/ pt/Wife/Caregiver re: general aspiration precautions and that while precautions may reduce aspiration risk, they cannot prevent aspiration risk, and that in order to follow through w/ precautions, pt will need consistent Supervision and cuing at meals to implement precautions effectively. Wife verbalized understanding of risks, strategies, and precautions. Recommend continue a more mechanical soft diet with Chopped foods and well-moistened meats, thin liquids Via CUP- use of a Dysphagia Drink Cup(handout given), Meds WHOLE vs CRUSHED in Puree. Pt will need assistance/cuing for f/u, Dry swallows to reduce residue and drooling during meals; to alternate wet/dry foods and SMALL sips of liquids. Upright, forward posture during any oral intake. Oral care prior to meals to reduce oral bacterial load. Use of a throat clear during/post meals can aid in airway clearing. Handouts given to Wife/Caregiver on above precautions/strategies. Wife thankful for the information.  Factors that may increase risk of adverse event in presence of aspiration [employer] 2021): Poor general health and/or compromised immunity;Reduced cognitive function;Frail or deconditioned  Swallow Evaluation Recommendations Recommendations: PO diet- find Pleasure foods in diet PO Diet Recommendation: Dysphagia 3-well-moistened (Mechanical soft); Thin liquids (Level 0) Liquid Administration via: Cup; No straw- a Dysphagia Drink Cup to offer more controlled sips/drinking Medication Administration: Whole meds with puree (vs Crushed in Puree as needed) Supervision: Patient able to self-feed; Full supervision/cueing for swallowing  strategies/precautions, including SMALL BITES/SIPS SLOWLY; Dry Swallow b/t Swallowing strategies: Minimize environmental distractions/Talking; Slow rate; Small bites/sips; Check for pocketing or oral holding; Multiple dry swallows after each bite/sip; Follow solids with wet foods/liquids; Clear throat intermittently during meal (and at end of meals) to aid airway clearing Postural changes: Position pt fully upright for meals; Stay upright 30-60 min after meals; Out of bed for meals Oral care recommendations: Oral care BID (2x/day); Oral care before PO; Staff/trained caregiver to provide oral care Recommended consults: Consider dietitian consultation; Consider Palliative care (for support in setting of comorbidities AND discussion on Cognitive decline/aging/swallowing) Caregiver Recommendations: Supervision at meals; CREAM SOUPS vs broth-based (for viscosity)        Comer Portugal, MS, CCC-SLP Speech Language Pathologist Rehab Services; [facility] - Agency [phone] (ascom) Zaeden Lastinger 02/02/2024,5:31 PM

## 2024-04-28 ENCOUNTER — Other Ambulatory Visit (INDEPENDENT_AMBULATORY_CARE_PROVIDER_SITE_OTHER): Payer: Self-pay | Admitting: Nurse Practitioner

## 2024-04-28 DIAGNOSIS — I739 Peripheral vascular disease, unspecified: Secondary | ICD-10-CM

## 2024-04-28 DIAGNOSIS — I6523 Occlusion and stenosis of bilateral carotid arteries: Secondary | ICD-10-CM

## 2024-05-03 ENCOUNTER — Ambulatory Visit (INDEPENDENT_AMBULATORY_CARE_PROVIDER_SITE_OTHER): Payer: Federal, State, Local not specified - PPO | Admitting: Vascular Surgery

## 2024-05-03 ENCOUNTER — Ambulatory Visit (INDEPENDENT_AMBULATORY_CARE_PROVIDER_SITE_OTHER): Payer: Medicare Other

## 2024-05-03 ENCOUNTER — Encounter (INDEPENDENT_AMBULATORY_CARE_PROVIDER_SITE_OTHER): Payer: Self-pay | Admitting: Vascular Surgery

## 2024-05-03 VITALS — BP 116/68 | HR 67 | Resp 18 | Ht 67.0 in | Wt 150.8 lb

## 2024-05-03 DIAGNOSIS — I6523 Occlusion and stenosis of bilateral carotid arteries: Secondary | ICD-10-CM

## 2024-05-03 DIAGNOSIS — I6320 Cerebral infarction due to unspecified occlusion or stenosis of unspecified precerebral arteries: Secondary | ICD-10-CM

## 2024-05-03 DIAGNOSIS — I10 Essential (primary) hypertension: Secondary | ICD-10-CM

## 2024-05-03 DIAGNOSIS — E7800 Pure hypercholesterolemia, unspecified: Secondary | ICD-10-CM | POA: Diagnosis not present

## 2024-05-03 NOTE — Assessment & Plan Note (Signed)
 Carotid duplex was performed today showing no change from his previous study 1 year ago.  The velocities in the right ICA would fall in the 40 to 59% range although after stenting they would be borderline for any significant recurrent disease at all.  The left carotid velocities are in the 1 to 39% range. Continue Plavix  and Lipitor.  Continue to follow on an annual basis with duplex.

## 2024-05-03 NOTE — Progress Notes (Signed)
 MRN : [id number]  Troy Chenault. is a 83 y.o. (01-Sep-1940) male who presents with chief complaint of  Chief Complaint  Patient presents with   Follow-up    1 year follow up carotid   .  History of Present Illness: Patient returns in follow-up of his carotid disease.  He is status post right carotid stent by another provider in 2011 for an acute stroke.  He is doing well.  He has had no recurrent focal neurologic symptoms. Specifically, the patient denies amaurosis fugax, speech or swallowing difficulties, or arm or leg weakness or numbness.  Carotid duplex was performed today showing no change from his previous study 1 year ago.  The velocities in the right ICA would fall in the 40 to 59% range although after stenting they would be borderline for any significant recurrent disease at all.  The left carotid velocities are in the 1 to 39% range.  Current Outpatient Medications  Medication Sig Dispense Refill   allopurinol  (ZYLOPRIM ) 100 MG tablet Take 100 mg by mouth at bedtime.     atorvastatin  (LIPITOR) 10 MG tablet Take 10 mg by mouth daily.     Calcium  Carb-Cholecalciferol 600-500 MG-UNIT CAPS Take 1 tablet by mouth daily.      Cholecalciferol (VITAMIN D) 2000 UNITS tablet Take 2,000 Units by mouth daily.     clopidogrel  (PLAVIX ) 75 MG tablet Take 75 mg by mouth daily.      colchicine 0.6 MG tablet Take 0.6-1.2 mg by mouth daily as needed (acute gout flare).     diclofenac  Sodium (VOLTAREN ) 1 % GEL Apply 2 g topically 4 (four) times daily. 50 g 1   fexofenadine (ALLEGRA) 180 MG tablet Take 180 mg by mouth daily as needed for allergies.     finasteride  (PROSCAR ) 5 MG tablet Take 1 tablet (5 mg total) by mouth daily. 90 tablet 3   fluticasone  (FLONASE ) 50 MCG/ACT nasal spray Place 2 sprays into both nostrils daily as needed for allergies.     folic acid  (FOLVITE ) 400 MCG tablet Take 400 mcg by mouth daily.     lamoTRIgine  (LAMICTAL ) 25 MG tablet Take 50 mg by mouth 2 (two) times  daily.     memantine (NAMENDA) 5 MG tablet Take 5 mg by mouth 2 (two) times daily.     pantoprazole  (PROTONIX ) 20 MG tablet Take 20 mg by mouth daily.     sertraline  (ZOLOFT ) 50 MG tablet Take 50 mg by mouth daily.     tamsulosin  (FLOMAX ) 0.4 MG CAPS capsule Take 0.4 mg by mouth daily.     No current facility-administered medications for this visit.    Past Medical History:  Diagnosis Date   Carotid stent occlusion    High cholesterol    Hypertension    Stroke [facility])    Vascular dementia [facility])     Past Surgical History:  Procedure Laterality Date   CATARACT EXTRACTION     GALLBLADDER SURGERY       Social History   Tobacco Use   Smoking status: Never   Smokeless tobacco: Never  Substance Use Topics   Alcohol use: Yes    Alcohol/week: 3.0 standard drinks of alcohol    Types: 1 Glasses of wine, 1 Cans of beer, 1 Shots of liquor per week    Comment: occasionally   Drug use: No      Family History  Problem Relation Age of Onset   Kidney failure Mother    Stroke Father  Allergies  Allergen Reactions   Other Hives   Sulfa Antibiotics Hives   Sulphadimidine [Sulfamethazine]        REVIEW OF SYSTEMS (Negative unless checked)   Constitutional: [] Weight loss  [] Fever  [] Chills Cardiac: [] Chest pain   [] Chest pressure   [] Palpitations   [] Shortness of breath when laying flat   [] Shortness of breath at rest   [] Shortness of breath with exertion. Vascular:  [] Pain in legs with walking   [] Pain in legs at rest   [] Pain in legs when laying flat   [] Claudication   [] Pain in feet when walking  [] Pain in feet at rest  [] Pain in feet when laying flat   [] History of DVT   [] Phlebitis   [] Swelling in legs   [] Varicose veins   [] Non-healing ulcers Pulmonary:   [] Uses home oxygen   [] Productive cough   [] Hemoptysis   [] Wheeze  [] COPD   [] Asthma Neurologic:  [] Dizziness  [x] Blackouts   [] Seizures   [x] History of stroke   [] History of TIA  [] Aphasia   [] Temporary blindness    [] Dysphagia   [] Weakness or numbness in arms   [] Weakness or numbness in legs Musculoskeletal:  [x] Arthritis   [] Joint swelling   [] Joint pain   [] Low back pain Hematologic:  [] Easy bruising  [] Easy bleeding   [] Hypercoagulable state   [] Anemic  [] Hepatitis Gastrointestinal:  [] Blood in stool   [] Vomiting blood  [] Gastroesophageal reflux/heartburn   [] Abdominal pain Genitourinary:  [] Chronic kidney disease   [] Difficult urination  [] Frequent urination  [] Burning with urination   [] Hematuria Skin:  [] Rashes   [] Ulcers   [] Wounds Psychological:  [] History of anxiety   []  History of major depression.  Physical Examination  Vitals:   05/03/24 1346  BP: 116/68  Pulse: 67  Resp: 18  Weight: 150 lb 12.8 oz (68.4 kg)  Height: 5' 7 (1.702 m)   Body mass index is 23.62 kg/m. Gen:  WD/WN, NAD Head: [place]/AT, No temporalis wasting. Ear/Nose/Throat: Hearing grossly intact, nares w/o erythema or drainage, trachea midline Eyes: Conjunctiva clear. Sclera non-icteric Neck: Supple.  Soft right carotid bruit  Pulmonary:  Good air movement, equal and clear to auscultation bilaterally.  Cardiac: RRR, No JVD Vascular:  Vessel Right Left  Radial Palpable Palpable           Musculoskeletal: M/S 5/5 throughout.  No deformity or atrophy. No edema. Neurologic: CN 2-12 intact. Sensation grossly intact in extremities.  Symmetrical.  Speech is fluent. Motor exam as listed above. Psychiatric: Judgment intact, Mood & affect appropriate for pt's clinical situation. Dermatologic: No rashes or ulcers noted.  No cellulitis or open wounds. Lymph : No Cervical, Axillary, or Inguinal lymphadenopathy.    CBC Lab Results  Component Value Date   WBC 6.3 07/12/2022   HGB 12.6 (L) 07/12/2022   HCT 38.2 (L) 07/12/2022   MCV 92.5 07/12/2022   PLT 191 07/12/2022    BMET    Component Value Date/Time   NA 135 07/12/2022 2315   NA 136 03/17/2014 0406   K 4.1 07/12/2022 2315   K 3.2 (L) 03/17/2014 0406   CL 102  07/12/2022 2315   CL 102 03/17/2014 0406   CO2 25 07/12/2022 2315   CO2 24 03/17/2014 0406   GLUCOSE 106 (H) 07/12/2022 2315   GLUCOSE 90 03/17/2014 0406   BUN 23 07/12/2022 2315   BUN 12 03/17/2014 0406   CREATININE 1.13 07/12/2022 2315   CREATININE 1.19 03/17/2014 0406   CALCIUM  8.8 (L) 07/12/2022 2315  CALCIUM  7.9 (L) 03/17/2014 0406   GFRNONAA >60 07/12/2022 2315   GFRNONAA >60 03/17/2014 0406   GFRAA 54 (L) 10/01/2016 2132   GFRAA >60 03/17/2014 0406   CrCl cannot be calculated (Patient's most recent lab result is older than the maximum 21 days allowed.).  COAG Lab Results  Component Value Date   INR 1.2 01/25/2021   INR 1.1 01/24/2021   INR 1.3 03/15/2014    Radiology No results found.   Assessment/Plan Carotid stenosis Carotid duplex was performed today showing no change from his previous study 1 year ago.  The velocities in the right ICA would fall in the 40 to 59% range although after stenting they would be borderline for any significant recurrent disease at all.  The left carotid velocities are in the 1 to 39% range. Continue Plavix  and Lipitor.  Continue to follow on an annual basis with duplex.  Stroke [facility]) 2011   High cholesterol lipid control important in reducing the progression of atherosclerotic disease. Continue statin therapy     Hypertension blood pressure control important in reducing the progression of atherosclerotic disease. On appropriate oral medications.  Selinda Gu, MD  05/03/2024 2:58 PM    This note was created with Dragon medical transcription system.  Any errors from dictation are purely unintentional

## 2024-06-28 ENCOUNTER — Other Ambulatory Visit: Payer: Self-pay

## 2024-06-28 DIAGNOSIS — Z8546 Personal history of malignant neoplasm of prostate: Secondary | ICD-10-CM

## 2024-06-28 DIAGNOSIS — N138 Other obstructive and reflux uropathy: Secondary | ICD-10-CM

## 2024-07-07 ENCOUNTER — Other Ambulatory Visit: Payer: Self-pay

## 2024-07-07 ENCOUNTER — Other Ambulatory Visit: Payer: Federal, State, Local not specified - PPO

## 2024-07-11 ENCOUNTER — Other Ambulatory Visit

## 2024-07-11 DIAGNOSIS — Z8546 Personal history of malignant neoplasm of prostate: Secondary | ICD-10-CM

## 2024-07-11 DIAGNOSIS — N138 Other obstructive and reflux uropathy: Secondary | ICD-10-CM

## 2024-07-12 LAB — PSA: Prostate Specific Ag, Serum: <0.1 ng/mL (ref 0.0–4.0)

## 2024-07-12 NOTE — Progress Notes (Unsigned)
 "   07/13/2024 1:49 PM   Troy Pugh [birth year] [id number]  Referring provider: Auston Reyes BIRCH, MD [address] [facility] [place],  [place] [address]  Urological history: 1. Prostate cancer -PSA (06/2024) <0.01 -s/p brachy seed (2012)   2. Urinary retention -?  Foley placement in ED without recorded residual -Tamsulosin  0.4 mg daily and finasteride  5 mg daily   Chief Complaint  Patient presents with   Benign Prostatic Hypertrophy   HPI: Troy Pugh. is a 84 y.o. male who presents today for 85-month follow-up after with his wife, Troy Pugh.   Previous records reviewed.    He is doing well.  Patient denies any modifying or aggravating factors.  Patient denies any recent UTI's, gross hematuria, dysuria or suprapubic/flank pain.  Patient denies any fevers, chills, nausea or vomiting.    UA (02/2024) colorless clear, specific gravity 1.002, pH 7.0, microscopically bland  PVR 137 mL  PSA (06/2024) <0.1  Serum creatinine (02/2024) 1.1    PMH: Past Medical History:  Diagnosis Date   Carotid stent occlusion    High cholesterol    Hypertension    Stroke [facility])    Vascular dementia [facility])     Surgical History: Past Surgical History:  Procedure Laterality Date   CATARACT EXTRACTION     GALLBLADDER SURGERY      Home Medications:  Allergies as of 07/13/2024       Reactions   Other Hives   Sulfa Antibiotics Hives   Sulphadimidine [sulfamethazine]         Medication List        Accurate as of July 13, 2024  1:49 PM. If you have any questions, ask your nurse or doctor.          allopurinol  100 MG tablet Commonly known as: ZYLOPRIM  Take 100 mg by mouth at bedtime.   atorvastatin  10 MG tablet Commonly known as: LIPITOR Take 10 mg by mouth daily.   Calcium  Carb-Cholecalciferol 600-500 MG-UNIT Caps Take 1 tablet by mouth daily.   clopidogrel  75 MG tablet Commonly known as: PLAVIX  Take 75 mg by mouth daily.    colchicine 0.6 MG tablet Take 0.6-1.2 mg by mouth daily as needed (acute gout flare).   diclofenac  Sodium 1 % Gel Commonly known as: VOLTAREN  Apply 2 g topically 4 (four) times daily.   fexofenadine 180 MG tablet Commonly known as: ALLEGRA Take 180 mg by mouth daily as needed for allergies.   finasteride  5 MG tablet Commonly known as: Proscar  Take 1 tablet (5 mg total) by mouth daily.   fluticasone  50 MCG/ACT nasal spray Commonly known as: FLONASE  Place 2 sprays into both nostrils daily as needed for allergies.   folic acid  400 MCG tablet Commonly known as: FOLVITE  Take 400 mcg by mouth daily.   galantamine 4 MG tablet Commonly known as: RAZADYNE Take 4 mg by mouth 2 (two) times daily.   glycopyrrolate 1 MG tablet Commonly known as: ROBINUL Take 1 mg by mouth 2 (two) times daily.   lamoTRIgine  25 MG tablet Commonly known as: LAMICTAL  Take 50 mg by mouth 2 (two) times daily.   memantine 5 MG tablet Commonly known as: NAMENDA Take 5 mg by mouth 2 (two) times daily.   pantoprazole  20 MG tablet Commonly known as: PROTONIX  Take 20 mg by mouth daily.   sertraline  50 MG tablet Commonly known as: ZOLOFT  Take 50 mg by mouth daily.   tamsulosin  0.4 MG Caps capsule Commonly known as:  FLOMAX  Take 0.4 mg by mouth daily.   Vitamin D 50 MCG (2000 UT) tablet Take 2,000 Units by mouth daily.        Allergies:  Allergies  Allergen Reactions   Other Hives   Sulfa Antibiotics Hives   Sulphadimidine [Sulfamethazine]     Family History: Family History  Problem Relation Age of Onset   Kidney failure Mother    Stroke Father     Social History:  reports that he has never smoked. He has never used smokeless tobacco. He reports current alcohol use of about 3.0 standard drinks of alcohol per week. He reports that he does not use drugs.  ROS: Pertinent ROS in HPI  Physical Exam: BP (!) 144/71   Pulse 71   Wt 152 lb (68.9 kg)   BMI 23.81 kg/m   Constitutional:   Well nourished. Alert and oriented, No acute distress. HEENT: [place] AT, moist mucus membranes.  Trachea midline Cardiovascular: No clubbing, cyanosis, or edema. Respiratory: Normal respiratory effort, no increased work of breathing. Neurologic: Grossly intact, no focal deficits, moving all 4 extremities. Psychiatric: Normal mood and affect.   Laboratory Data: See Epic and HPI   I have reviewed the labs.   Pertinent Imaging:  07/13/24 13:30  Scan Result    Assessment & Plan:    1. BPH with LUTS - PVR < 300 cc  - encouraged avoiding bladder irritants, fluid restriction before bedtime and timed voiding's - Continue finasteride  5 mg daily   2.  Prostate cancer -PSA remains undetectable   Return in about 1 year (around 07/13/2025) for PSA, PVR .  These notes generated with voice recognition software. I apologize for typographical errors.  Troy Pugh  [facility] [address]  [address] [place], [place] [address] [phone]   "

## 2024-07-13 ENCOUNTER — Encounter: Payer: Self-pay | Admitting: Urology

## 2024-07-13 ENCOUNTER — Ambulatory Visit: Payer: Self-pay | Admitting: Urology

## 2024-07-13 VITALS — BP 144/71 | HR 71 | Wt 152.0 lb

## 2024-07-13 DIAGNOSIS — N401 Enlarged prostate with lower urinary tract symptoms: Secondary | ICD-10-CM

## 2024-07-13 DIAGNOSIS — N138 Other obstructive and reflux uropathy: Secondary | ICD-10-CM | POA: Diagnosis not present

## 2024-07-13 DIAGNOSIS — R339 Retention of urine, unspecified: Secondary | ICD-10-CM | POA: Diagnosis not present

## 2024-07-13 LAB — BLADDER SCAN AMB NON-IMAGING

## 2024-07-13 MED ORDER — FINASTERIDE 5 MG PO TABS: 5.0000 mg | ORAL_TABLET | Freq: Every day | ORAL | 3 refills | Status: AC

## 2024-07-14 ENCOUNTER — Ambulatory Visit: Payer: Federal, State, Local not specified - PPO | Admitting: Urology

## 2025-05-09 ENCOUNTER — Ambulatory Visit (INDEPENDENT_AMBULATORY_CARE_PROVIDER_SITE_OTHER): Admitting: Vascular Surgery

## 2025-05-09 ENCOUNTER — Encounter (INDEPENDENT_AMBULATORY_CARE_PROVIDER_SITE_OTHER)

## 2025-07-13 ENCOUNTER — Ambulatory Visit: Admitting: Urology
